# Patient Record
Sex: Female | Born: 1980 | Race: Black or African American | Hispanic: No | Marital: Married | Smoking: Former smoker
Health system: Southern US, Community
[De-identification: ages and names within clinical notes are randomized; demographics above are authoritative.]

## PROBLEM LIST (undated history)

## (undated) DIAGNOSIS — I10 Essential (primary) hypertension: Secondary | ICD-10-CM

## (undated) DIAGNOSIS — D649 Anemia, unspecified: Secondary | ICD-10-CM

## (undated) DIAGNOSIS — G4730 Sleep apnea, unspecified: Secondary | ICD-10-CM

## (undated) DIAGNOSIS — I509 Heart failure, unspecified: Secondary | ICD-10-CM

## (undated) DIAGNOSIS — E785 Hyperlipidemia, unspecified: Secondary | ICD-10-CM

## (undated) DIAGNOSIS — N390 Urinary tract infection, site not specified: Secondary | ICD-10-CM

## (undated) HISTORY — PX: UPPER GASTROINTESTINAL ENDOSCOPY: SHX188

---

## 2006-08-23 ENCOUNTER — Emergency Department (HOSPITAL_COMMUNITY): Admission: EM | Admit: 2006-08-23 | Discharge: 2006-08-24 | Payer: Self-pay | Admitting: Emergency Medicine

## 2006-10-12 ENCOUNTER — Emergency Department (HOSPITAL_COMMUNITY): Admission: EM | Admit: 2006-10-12 | Discharge: 2006-10-12 | Payer: Self-pay | Admitting: Family Medicine

## 2006-11-07 ENCOUNTER — Ambulatory Visit (HOSPITAL_COMMUNITY): Admission: RE | Admit: 2006-11-07 | Discharge: 2006-11-07 | Payer: Self-pay | Admitting: Urology

## 2007-01-02 ENCOUNTER — Emergency Department (HOSPITAL_COMMUNITY): Admission: EM | Admit: 2007-01-02 | Discharge: 2007-01-02 | Payer: Self-pay | Admitting: Family Medicine

## 2007-02-17 ENCOUNTER — Emergency Department (HOSPITAL_COMMUNITY): Admission: EM | Admit: 2007-02-17 | Discharge: 2007-02-17 | Payer: Self-pay | Admitting: Emergency Medicine

## 2007-02-20 ENCOUNTER — Ambulatory Visit (HOSPITAL_BASED_OUTPATIENT_CLINIC_OR_DEPARTMENT_OTHER): Admission: RE | Admit: 2007-02-20 | Discharge: 2007-02-20 | Payer: Self-pay | Admitting: Family Medicine

## 2007-02-22 ENCOUNTER — Ambulatory Visit: Payer: Self-pay | Admitting: Internal Medicine

## 2007-04-01 ENCOUNTER — Emergency Department (HOSPITAL_COMMUNITY): Admission: EM | Admit: 2007-04-01 | Discharge: 2007-04-01 | Payer: Self-pay | Admitting: Emergency Medicine

## 2007-04-22 ENCOUNTER — Emergency Department (HOSPITAL_COMMUNITY): Admission: EM | Admit: 2007-04-22 | Discharge: 2007-04-22 | Payer: Self-pay | Admitting: Family Medicine

## 2007-06-28 ENCOUNTER — Emergency Department (HOSPITAL_COMMUNITY): Admission: EM | Admit: 2007-06-28 | Discharge: 2007-06-28 | Payer: Self-pay | Admitting: Emergency Medicine

## 2007-10-07 ENCOUNTER — Emergency Department (HOSPITAL_COMMUNITY): Admission: EM | Admit: 2007-10-07 | Discharge: 2007-10-07 | Payer: Self-pay | Admitting: Family Medicine

## 2008-10-15 ENCOUNTER — Emergency Department (HOSPITAL_COMMUNITY): Admission: EM | Admit: 2008-10-15 | Discharge: 2008-10-15 | Payer: Self-pay | Admitting: *Deleted

## 2008-11-02 ENCOUNTER — Ambulatory Visit: Payer: Self-pay | Admitting: Cardiology

## 2008-11-02 ENCOUNTER — Inpatient Hospital Stay (HOSPITAL_COMMUNITY): Admission: EM | Admit: 2008-11-02 | Discharge: 2008-11-04 | Payer: Self-pay | Admitting: Family Medicine

## 2008-11-02 ENCOUNTER — Encounter (INDEPENDENT_AMBULATORY_CARE_PROVIDER_SITE_OTHER): Payer: Self-pay | Admitting: Cardiology

## 2008-11-12 ENCOUNTER — Emergency Department (HOSPITAL_COMMUNITY): Admission: EM | Admit: 2008-11-12 | Discharge: 2008-11-12 | Payer: Self-pay | Admitting: Emergency Medicine

## 2008-11-15 ENCOUNTER — Emergency Department (HOSPITAL_COMMUNITY): Admission: EM | Admit: 2008-11-15 | Discharge: 2008-11-15 | Payer: Self-pay | Admitting: Emergency Medicine

## 2008-11-20 DIAGNOSIS — R609 Edema, unspecified: Secondary | ICD-10-CM | POA: Insufficient documentation

## 2008-11-20 DIAGNOSIS — E119 Type 2 diabetes mellitus without complications: Secondary | ICD-10-CM | POA: Insufficient documentation

## 2008-11-20 DIAGNOSIS — I10 Essential (primary) hypertension: Secondary | ICD-10-CM | POA: Insufficient documentation

## 2008-11-20 DIAGNOSIS — I517 Cardiomegaly: Secondary | ICD-10-CM | POA: Insufficient documentation

## 2008-11-20 DIAGNOSIS — G4730 Sleep apnea, unspecified: Secondary | ICD-10-CM | POA: Insufficient documentation

## 2008-11-22 ENCOUNTER — Encounter (INDEPENDENT_AMBULATORY_CARE_PROVIDER_SITE_OTHER): Payer: Self-pay | Admitting: Nurse Practitioner

## 2008-11-22 ENCOUNTER — Ambulatory Visit: Payer: Self-pay | Admitting: Internal Medicine

## 2008-11-22 DIAGNOSIS — I2789 Other specified pulmonary heart diseases: Secondary | ICD-10-CM | POA: Insufficient documentation

## 2008-11-22 DIAGNOSIS — I5023 Acute on chronic systolic (congestive) heart failure: Secondary | ICD-10-CM | POA: Insufficient documentation

## 2008-11-22 DIAGNOSIS — A389 Scarlet fever, uncomplicated: Secondary | ICD-10-CM | POA: Insufficient documentation

## 2008-11-22 DIAGNOSIS — R000 Tachycardia, unspecified: Secondary | ICD-10-CM | POA: Insufficient documentation

## 2008-11-22 LAB — CONVERTED CEMR LAB
BUN: 13 mg/dL (ref 6–23)
CO2: 29 meq/L (ref 19–32)
Chloride: 107 meq/L (ref 96–112)
Creatinine, Ser: 0.9 mg/dL (ref 0.4–1.2)
GFR calc non Af Amer: 96.03 mL/min (ref >60)
Glucose, Bld: 223 mg/dL — ABNORMAL HIGH (ref 70–99)
Potassium: 4.6 meq/L (ref 3.5–5.1)
Sodium: 141 meq/L (ref 135–145)

## 2009-05-29 ENCOUNTER — Emergency Department (HOSPITAL_COMMUNITY): Admission: EM | Admit: 2009-05-29 | Discharge: 2009-05-29 | Payer: Self-pay | Admitting: Emergency Medicine

## 2010-04-02 ENCOUNTER — Emergency Department (HOSPITAL_COMMUNITY): Admission: EM | Admit: 2010-04-02 | Discharge: 2010-04-02 | Payer: Self-pay | Admitting: Emergency Medicine

## 2010-04-25 ENCOUNTER — Emergency Department (HOSPITAL_COMMUNITY): Admission: EM | Admit: 2010-04-25 | Discharge: 2010-04-25 | Payer: Self-pay | Admitting: Family Medicine

## 2010-09-28 LAB — WOUND CULTURE: Gram Stain: NONE SEEN

## 2010-10-18 LAB — BASIC METABOLIC PANEL
Chloride: 101 mEq/L (ref 96–112)
Creatinine, Ser: 0.82 mg/dL (ref 0.4–1.2)
GFR calc non Af Amer: >60 mL/min (ref >60)
Potassium: 3.9 mEq/L (ref 3.5–5.1)
Sodium: 135 mEq/L (ref 135–145)

## 2010-10-18 LAB — DIFFERENTIAL
Basophils Absolute: 0 10*3/uL (ref 0.0–0.1)
Eosinophils Absolute: 0.2 10*3/uL (ref 0.0–0.7)
Monocytes Relative: 4 % (ref 3–12)

## 2010-10-18 LAB — CBC
Hemoglobin: 13 g/dL (ref 12.0–15.0)
MCHC: 32.7 g/dL (ref 30.0–36.0)
Platelets: 351 10*3/uL (ref 150–400)
RBC: 4.44 MIL/uL (ref 3.87–5.11)
RDW: 13.4 % (ref 11.5–15.5)

## 2010-10-24 LAB — POCT PREGNANCY, URINE: Preg Test, Ur: NEGATIVE

## 2010-10-24 LAB — POCT URINALYSIS DIP (DEVICE)
Glucose, UA: 100 mg/dL — AB
Protein, ur: >=300 mg/dL — AB
Urobilinogen, UA: 2 mg/dL — ABNORMAL HIGH (ref 0.0–1.0)

## 2010-10-24 LAB — URINE CULTURE

## 2010-10-25 LAB — GLUCOSE, CAPILLARY
Glucose-Capillary: 173 mg/dL — ABNORMAL HIGH (ref 70–99)
Glucose-Capillary: 175 mg/dL — ABNORMAL HIGH (ref 70–99)
Glucose-Capillary: 184 mg/dL — ABNORMAL HIGH (ref 70–99)
Glucose-Capillary: 190 mg/dL — ABNORMAL HIGH (ref 70–99)
Glucose-Capillary: 192 mg/dL — ABNORMAL HIGH (ref 70–99)
Glucose-Capillary: 192 mg/dL — ABNORMAL HIGH (ref 70–99)
Glucose-Capillary: 207 mg/dL — ABNORMAL HIGH (ref 70–99)
Glucose-Capillary: 214 mg/dL — ABNORMAL HIGH (ref 70–99)
Glucose-Capillary: 239 mg/dL — ABNORMAL HIGH (ref 70–99)
Glucose-Capillary: 37 mg/dL — CL (ref 70–99)

## 2010-10-25 LAB — COMPREHENSIVE METABOLIC PANEL
ALT: 33 U/L (ref 0–35)
ALT: 35 U/L (ref 0–35)
AST: 32 U/L (ref 0–37)
AST: 48 U/L — ABNORMAL HIGH (ref 0–37)
Albumin: 3.2 g/dL — ABNORMAL LOW (ref 3.5–5.2)
Albumin: 3.4 g/dL — ABNORMAL LOW (ref 3.5–5.2)
Alkaline Phosphatase: 97 U/L (ref 39–117)
CO2: 24 mEq/L (ref 19–32)
Calcium: 8.9 mg/dL (ref 8.4–10.5)
Chloride: 101 mEq/L (ref 96–112)
Chloride: 101 mEq/L (ref 96–112)
Creatinine, Ser: 0.94 mg/dL (ref 0.4–1.2)
GFR calc Af Amer: >60 mL/min (ref >60)
GFR calc Af Amer: >60 mL/min (ref >60)
GFR calc non Af Amer: 50 mL/min — ABNORMAL LOW (ref >60)
Potassium: 4.2 mEq/L (ref 3.5–5.1)
Sodium: 137 mEq/L (ref 135–145)
Sodium: 135 mEq/L (ref 135–145)
Total Bilirubin: 0.8 mg/dL (ref 0.3–1.2)

## 2010-10-25 LAB — DIFFERENTIAL
Basophils Absolute: 0 10*3/uL (ref 0.0–0.1)
Basophils Absolute: 0 10*3/uL (ref 0.0–0.1)
Basophils Relative: 0 % (ref 0–1)
Eosinophils Absolute: 0.1 10*3/uL (ref 0.0–0.7)
Eosinophils Absolute: 0.1 10*3/uL (ref 0.0–0.7)
Eosinophils Relative: 1 % (ref 0–5)
Eosinophils Relative: 1 % (ref 0–5)
Lymphocytes Relative: 19 % (ref 12–46)
Lymphocytes Relative: 12 % (ref 12–46)
Monocytes Absolute: 0.4 10*3/uL (ref 0.1–1.0)
Monocytes Relative: 4 % (ref 3–12)

## 2010-10-25 LAB — CARDIAC PANEL(CRET KIN+CKTOT+MB+TROPI)
CK, MB: 1 ng/mL (ref 0.3–4.0)
Troponin I: 0.01 ng/mL (ref 0.00–0.06)

## 2010-10-25 LAB — POCT CARDIAC MARKERS
CKMB, poc: <1 ng/mL — ABNORMAL LOW (ref 1.0–8.0)
Myoglobin, poc: 83.4 ng/mL (ref 12–200)
Troponin i, poc: <0.05 ng/mL (ref 0.00–0.09)

## 2010-10-25 LAB — URINALYSIS, ROUTINE W REFLEX MICROSCOPIC
Bilirubin Urine: NEGATIVE
Glucose, UA: NEGATIVE mg/dL
Hgb urine dipstick: NEGATIVE
Ketones, ur: NEGATIVE mg/dL
Leukocytes, UA: NEGATIVE
Nitrite: NEGATIVE
Nitrite: NEGATIVE
Protein, ur: 30 mg/dL — AB
Protein, ur: 30 mg/dL — AB
Specific Gravity, Urine: 1.014 (ref 1.005–1.030)
Urobilinogen, UA: 0.2 mg/dL (ref 0.0–1.0)
Urobilinogen, UA: 0.2 mg/dL (ref 0.0–1.0)
pH: 6.5 (ref 5.0–8.0)

## 2010-10-25 LAB — URINE CULTURE: Colony Count: 9000

## 2010-10-25 LAB — POCT I-STAT, CHEM 8
BUN: 10 mg/dL (ref 6–23)
Chloride: 101 mEq/L (ref 96–112)
Glucose, Bld: 181 mg/dL — ABNORMAL HIGH (ref 70–99)
Hemoglobin: 14.6 g/dL (ref 12.0–15.0)
TCO2: 27 mmol/L (ref 0–100)

## 2010-10-25 LAB — POCT URINALYSIS DIP (DEVICE)
Ketones, ur: 15 mg/dL — AB
Protein, ur: >=300 mg/dL — AB
Specific Gravity, Urine: 1.01 (ref 1.005–1.030)
pH: 5 (ref 5.0–8.0)

## 2010-10-25 LAB — CBC
HCT: 39.9 % (ref 36.0–46.0)
Hemoglobin: 13.1 g/dL (ref 12.0–15.0)
MCHC: 32.9 g/dL (ref 30.0–36.0)
MCV: 87.6 fL (ref 78.0–100.0)
MCV: 87.9 fL (ref 78.0–100.0)
RBC: 4.68 MIL/uL (ref 3.87–5.11)
RBC: 4.54 MIL/uL (ref 3.87–5.11)
RDW: 14.4 % (ref 11.5–15.5)
WBC: 10.6 10*3/uL — ABNORMAL HIGH (ref 4.0–10.5)
WBC: 13 10*3/uL — ABNORMAL HIGH (ref 4.0–10.5)

## 2010-10-25 LAB — POCT PREGNANCY, URINE
Preg Test, Ur: NEGATIVE
Preg Test, Ur: NEGATIVE

## 2010-10-25 LAB — BRAIN NATRIURETIC PEPTIDE: Pro B Natriuretic peptide (BNP): 308 pg/mL — ABNORMAL HIGH (ref 0.0–100.0)

## 2010-10-25 LAB — HEMOGLOBIN A1C
Hgb A1c MFr Bld: 9.6 % — ABNORMAL HIGH (ref 4.6–6.1)
Mean Plasma Glucose: 229 mg/dL

## 2010-10-25 LAB — PREGNANCY, URINE: Preg Test, Ur: NEGATIVE

## 2010-10-25 LAB — URINE MICROSCOPIC-ADD ON

## 2010-10-25 LAB — CK TOTAL AND CKMB (NOT AT ARMC): CK, MB: 1.1 ng/mL (ref 0.3–4.0)

## 2010-10-25 LAB — TROPONIN I: Troponin I: 0.01 ng/mL (ref 0.00–0.06)

## 2010-11-28 NOTE — Discharge Summary (Signed)
Kathryn [name], Kathryn [name] NO.:  [id number]   MEDICAL RECORD NO.:  [id number]          PATIENT TYPE:  INP   LOCATION:  4715                         FACILITY:  [facility]   PHYSICIAN:  Charlestine Massed, MDDATE OF BIRTH:  Apr 27, 1981   DATE OF ADMISSION:  11/01/2008  DATE OF DISCHARGE:  11/04/2008                               DISCHARGE SUMMARY   PRIMARY CARE PHYSICIAN:  Thayer Headings, MD, of [facility].   CARDIOLOGY:  Luis Abed, MD, [facility], of [facility].   PULMONOLOGY:  Clinton D. Maple Hudson, MD, FCCP, FACP   REASON FOR ADMISSION:  Shortness of breath.   DISCHARGE DIAGNOSIS:  1. Congestive heart failure with systolic dysfunction of both left and      right ventricles with an ejection fraction of 25-30%.  2. Obstructive sleep apnea with right heart dysfunction.  3. Pulmonary hypertension.  4. Morbid obesity.  5. Diabetes mellitus.  6. Hypertension.   DISCHARGE MEDICATIONS:  1. Coreg 6.25 mg p.o. b.i.d..  2. Prinzide 10/12.5 two tablets p.o. daily.  3. Metformin 850 mg p.o. b.i.d.  4. Potassium chloride 10 mEq p.o. daily.  5. NovoLog coverage q.a.c. and nightly with moderate scale of the      [facility] scale on sliding scale.  6. Lantus 8 units nightly subcutaneously.   HOSPITAL COURSE:  1. Congestive heart failure.  Ms. Kathryn [name] is a 30 year old female,      who is morbidly obese and was diagnosed with obstructive sleep      apnea in August 2008, who is not on a CPAP at home as she is not      using it.  She is not using CPAP at home, came in with complaints      of shortness of breath.  She stated that the shortness of breath,      has come on over a few days and is gradually getting worse.  She      works as a Occupational psychologist for [employer] and      she does not do any strenuous works.  As of the exercise tolerance,      she stated that she can walk quite a long distances without getting  short of breath that she can make even to more than 10 blocks and      for going up the stairs, she said that she can go up 1 full flight      of stairs without having to stop and neither chest pain nor      shortness of breath, stops her ability to work or walk.  She was      evaluated for heart failure as she had crackles initially      minimally, and was put on diuresis.  Then, echocardiogram revealed      an ejection fraction of 25% and diffuse left ventricular      hypokinesis.  Also, the right ventricle was also dilated with      systolic function depressed and pulmonary artery pressure was 48      mmHg.  Cardiology consult was called and seen by Dr. Myrtis Ser and was      advised to follow up in the CHF Clinic at [facility], so the patient      is being discharged today to follow up with Cardiology as      outpatient for further care.  2. Obstructive sleep apnea.  The patient stated that she did not      receive CPAP machine because of insurance issues.  We discussed      with the social worker and Sports coach, worked together here, and      her CPAP machine has been set up, and she will be getting it today      evening as she has been advised to use it at a pressure of 11 cm of      water as has been advised by Dr. Maple Hudson in the report of his      nocturnal polysomnogram.  3. Diabetes mellitus.  Her blood sugars still not well controlled, so      she has been started on Lantus at 80 units nightly and started on      metformin 850 mg p.o. b.i.d.  She will also continue Glucometer      check q.a.c. and q.h.s. that is before meals and at night to get      coverage and to cover with NovoLog insulin.  4. Hypertension.  She has been started on Prinzide 10/12.5 one to two      tablets p.o. daily.  Blood pressure is very well controlled.  She      is also on Coreg for hypertension as well as for the heart failure,      so the patient's blood pressure is currently well controlled.   DISPOSITION:   She is discharged back home.   INSTRUCTIONS:  1. To keep fluid intake to 1.5 L a day.  2. Low-salt, heart-healthy, and diabetic diet.  3. Follow up blood sugars regularly and get it controlled with      coverage.  4. Follow up with Cardiology and Pulmonology as outpatient.  5. To use CPAP at home regularly at night.   FOLLOWUP:  1. Follow up with [facility], date has been set on Nov 22, 2008, at 10:20 a.m. and in the CHF Clinic, until then continue the      medications and instructions as mentioned.  2. The patient is to follow up with the primary care doctor within 1-2      weeks.  3. Return to work on Monday, that is November 08, 2008.   A total of 45 minutes was spent on this discharge.      Charlestine Massed, MD  Electronically Signed     UT/MEDQ  D:  11/04/2008  T:  11/05/2008  Job:  [id number]   cc:   Thayer Headings, M.D.  Luis Abed, MD, [facility]  Clinton D. Maple Hudson, MD, FCCP, FACP

## 2010-11-28 NOTE — Procedures (Signed)
Kathryn [name], Kathryn [name] NO.:  [id number]   MEDICAL RECORD NO.:  [id number]          PATIENT TYPE:  OUT   LOCATION:  SLEEP CENTER                 FACILITY:  [facility]   PHYSICIAN:  Clinton D. Maple Hudson, MD, FCCP, FACPDATE OF BIRTH:  09[birth year]18/1980   DATE OF STUDY:  02/20/2007                            NOCTURNAL POLYSOMNOGRAM   REFERRING PHYSICIAN:  Knox Royalty, Dr.   INDICATION FOR STUDY:  Hypersomnia with sleep apnea.   EPWORTH SLEEPINESS SCORE:  11/24, height 5 feet 6 inches, weight 460  pounds.   HOME MEDICATIONS:  Listed.   SLEEP ARCHITECTURE:  Total sleep time 299 minutes with sleep efficiency  73%.  Stage I 12%, stage II 69%, stage III 10%, REM 9% of total sleep  time.  Sleep latency 10 minutes.  REM latency 271 minutes.  Awake after  sleep onset 94 minutes.  Arousal index 19.3.  No bedtime medication was  taken.   RESPIRATORY DATA:  Split study protocol.  Apnea/hypopnea index (AHI/RDI)  38.2 obstructive events per hour indicating moderately severe  obstructive sleep apnea before CPAP.  There were a total of 80 hypopneas  before CPAP.  Events were not positional.  REM AHI 66.4.  CPAP was  titrated to 16-CWP.  Pressures in this range were associated with  breakthrough events and may have represented development of nasal  congestion.  Suggest initial home trial at 11-CWP which was associated  with an AHI of zero per hour.  Further adjustment may be necessary but  this would be a comfortable pressure to try initially.  A medium comfort  full face gel mask was chosen with heated humidifier.   OXYGEN DATA:  Moderate continuous snoring with oxygen desaturation to a  nadir of 80% before CPAP.  After CPAP control, saturation held 93% on  room air.   CARDIAC DATA:  Sinus tachycardia 104-107 per minute.   MOVEMENT-PARASOMNIA:  Insignificant.  Bathroom x2.   IMPRESSIONS-RECOMMENDATIONS:  1. Moderately severe obstructive sleep apnea/hypopnea syndrome, AHI      38.2 per  hour with non-positional events, moderate snoring, and      oxygen desaturation to a nadir of 80%.  2. CPAP titration to suggest an initial titration pressure of 11-CWP      which gave AHI of zero per hour during the time attempted.  Higher      pressures were associated with residual events which may have been      due to      development of nasal congestion with higher air flows.  A medium      Respironics Comfort full face gel mask was used with heated      humidifier.      Clinton D. Maple Hudson, MD, [facility], FACP  Diplomate, Biomedical engineer of Sleep Medicine  Electronically Signed     CDY/MEDQ  D:  02/22/2007 13:47:59  T:  02/23/2007 12:21:10  Job:  [id number]

## 2010-11-28 NOTE — Consult Note (Signed)
NAMEAMOR, Kathryn NO.:  [id number]   MEDICAL RECORD NO.:  [id number]          PATIENT TYPE:  INP   LOCATION:  4715                         FACILITY:  [facility]   PHYSICIAN:  Luis Abed, MD, FACCDATE OF BIRTH:  06/11/1981   DATE OF CONSULTATION:  11/04/2008  DATE OF DISCHARGE:                                 CONSULTATION   CARDIOLOGIST:  New, will be Luis Abed, MD, [facility]   The patient will also be followed in the Heart Failure Clinic.   PRIMARY CARE PHYSICIAN:  Thayer Headings, MD, with [facility].   Kathryn [name] is a 30 year old African American female with past medical  history of hypertension, diabetes, and morbid obesity also with a  history of sleep apnea without previous initiation of CPAP.  She has no  known history of coronary artery disease.  She states she was told she  had an enlarged heart years ago by chest x-ray, but has never been  diagnosed formally with cardiomyopathy or CHF.  She presented to urgent  care on November 02, 2008, complaining of mild dyspnea associated with cold  and congestion with a productive cough.  The patient apparently was  found to be hypertensive and was sent by [facility] to [facility] ER for  further evaluation.  Previous documentation shows a blood pressure of  168/121.  Kathryn [name] states she was very anxious by that point, extremely  upset because she did not know what is happening.  She was treated with  clonidine and Lasix.  Chest x-ray obtained showed cardiac enlargement  and vascular congestion without overt pulmonary edema.  The patient was  admitted by the [facility] team for further evaluation, a 2-D  echocardiogram was obtained that showed an EF of 25-30% with diffuse  hypokinesis and moderate pulmonary hypertension with peak pressure of 48  mmHg.  Kathryn [name] states she is feeling much better.  She states that  every one has made a major ordeal over her symptoms.  She does not feel  that she  was in any acute distress.  She is somewhat frustrated that she  has been hospitalized for the above symptoms.  In reviewing her history  she has a very sedentary lifestyle.  She lives alone, morbidly obese,  and noncompliant with diet.  She states that she generally has lower  extremity edema that is no worse than it is at baseline.  She does not  feel her breathing has gotten worse.  She is dyspneic with exertion  contributes that to extreme deconditioning.  She does not know what her  blood pressure normally runs, but states she has well controlled  hypertension.   PAST MEDICAL HISTORY:  1. Diabetes.  2. Hypertension.  3. Chronic lower extremity edema.  4. Cardiomegaly apparently by chest x-ray years ago.  5. Morbid obesity.  6. Obstructive sleep apnea without CPAP use.   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  She denies any tobacco or EtOH use.  Sedentary lifestyle.  She  lives alone.  No children.  Mother  is alive with diabetes, hypertension,  and sleep apnea.  Father is unknown.   ALLERGIES:  SULFA.   MEDICATIONS HERE:  1. Carvedilol 6.25 mg b.i.d., which is new.  2. Prinzide 10/12.5 mg.  3. Metformin 850 b.i.d.  4. Lantus insulin.  5. Lovenox.  6. Potassium chloride.   REVIEW OF SYSTEMS:  Positive for dyspnea.  Positive for productive  cough.  Positive for chronic lower extremity edema.  All other systems  reviewed and negative.   PHYSICAL EXAMINATION:  VITAL SIGNS:  Temperature 97, heart rate 90,  respirations 19, blood pressure currently 109/72, and sat 93% on room  air.  Weight 206.8 kg.  GENERAL:  Kathryn [name] is in no acute distress.  HEENT:  Unremarkable.  NECK:  Supple without obvious JVD, difficult to assess secondary to body  habitus.  LUNGS:  Clear to auscultation with distant breath sounds throughout.  Poor inspiratory effort.  CARDIOVASCULAR:  S1-S2.  Regular rate and rhythm.  Distant heart sounds.  ABDOMEN:  Obese,  soft, and nontender.  Positive bowel sounds.  Unable to  appreciate hepatomegaly.  EXTREMITIES:  Lower extremities without clubbing, cyanosis, or signs of  a pitting edema.  NEUROLOGIC:  Alert and oriented x3 with flat effect.   Initial chest x-ray results as stated above.  Lab work shows a couple of  days ago sodium 137, potassium 3.5, BUN 8, creatinine 0.9, glucose 189,  H and H 13.4 and 41.4, WBC is 10.6, and platelets 337,000.  Magnesium  1.7, hemoglobin A1c 9.6, AST is 32, and ALT is 33.  Cardiac enzymes are  negative x3.  Initial BNP was 308.  A 12-lead EKG shows sinus tach at a  rate of 115.  Normal sinus rhythm with mild LVH.   IMPRESSION:  Left ventricular dysfunction.  We will need to optimize  medication.  The patient is in no acute distress.  At this time, agree  with switching metoprolol to carvedilol.  Continue ACE inhibitor.  The  patient will most likely need a diuretic stronger than  hydrochlorothiazide.  She also needs to be set up with CPAP for her  severe sleep apnea, which I suspect is contributing to her LV  dysfunction, diabetes, and hypertension per primary care physician.  The  patient is stable to be discharged home from a cardiac perspective and  followup outpatient.  I will see her in the Heart Failure Clinic on Nov 22, 2008, at 10:20 as a new patient.  Dr. Willa Rough has been into  examine and assess the patient, agrees with plan of care.      Dorian Pod, ACNP      Luis Abed, MD, [facility]  Electronically Signed    MB/MEDQ  D:  11/04/2008  T:  11/04/2008  Job:  [id number]

## 2010-11-28 NOTE — H&P (Signed)
Kathryn [name], Kathryn [name] NO.:  [id number]   MEDICAL RECORD NO.:  [id number]          PATIENT TYPE:  INP   LOCATION:  4715                         FACILITY:  [facility]   PHYSICIAN:  Selena Batten, MD     DATE OF BIRTH:  09/28/1980   DATE OF ADMISSION:  11/01/2008  DATE OF DISCHARGE:                              HISTORY & PHYSICAL   CHIEF COMPLAINT:  Shortness of breath.   HISTORY OF PRESENT ILLNESS:  This is a 30 year old African American  female with hypertension, diabetes mellitus, and morbid obesity who  presents for further evaluation and treatment for shortness of breath.  The patient states that the shortness of breath has been getting worse  over the past several days, and it seems to get worse with exertion.  She denies any overt episodes of chest pain or pressure.  She is unable  to lie down on her back as usual.  She has no increased lower extremity  edema compared to baseline.  She has difficult time exerting herself and  has limited mobility given her size.  No early family history of  premature coronary artery disease.   PAST MEDICAL HISTORY:  1. Morbid obesity.  2. Diabetes mellitus.  3. Hypertension.   ALLERGIES:  The patient is unable to recall allergies at this time.   MEDICATIONS:  1. Lisinopril, dosage unknown.  2. Metformin, dosage unknown.  3. Metoprolol tartrate, dosage unknown.  4. Nifedipine, dosage unknown.  5. Macrobid, dosage unknown.   SOCIAL HISTORY:  No tobacco, no alcohol.   REVIEW OF SYSTEMS:  A 14-point review of systems was performed and was  negative except as per HPI.   PHYSICAL EXAMINATION:  VITAL SIGNS:  Blood pressure was 161/110, pulse  is 110, and sating 97% on room air.  GENERAL:  In no acute distress.  HEAD:  Normocephalic and atraumatic.  EYES:  Pupils equal, round, and reactive to light.  Extraocular muscles  are intact.  NECK:  Supple.  No masses.  No bruits.  No thyromegaly.  LUNGS:  Clear to auscultation  bilaterally.  HEART:  Slightly tachycardic, buy regular.  No murmurs, rubs, or  gallops.  ABDOMEN:  Positive bowel sounds, soft, nontender, and nondistended.  EXTREMITIES:  No clubbing, cyanosis, or edema.  NEUROLOGIC:  Nonfocal, moving all extremities x4.   LABORATORY DATA:  Lab work demonstrates UA that is negative for  infection.  Negative pregnancy test.  BNP of 308 and cardiac markers  which are negative.  CBC which shows a white count of 10.6 which is  apparently elevated.   ASSESSMENT:  This is a 30 year old African American female with diabetes  mellitus, hypertension, and morbid obesity here with shortness of  breath.  Shortness of breath.  We will assess with a transthoracic echo in the  a.m.  In the meantime, we will rule out with cardiac enzymes.  Monitor  with telemetry.  The patient already received IV Lasix, will monitor  response to that.  We will get the patient back on her lisinopril as  well as metoprolol.  Consider further workup pending  results of the  transthoracic echo.   DISPOSITION:  Pending further workup.      Selena Batten, MD  Electronically Signed     BS/MEDQ  D:  11/02/2008  T:  11/02/2008  Job:  [id number]

## 2011-01-15 HISTORY — PX: GASTRIC BYPASS: SHX52

## 2011-04-09 LAB — POCT URINALYSIS DIP (DEVICE)
Bilirubin Urine: NEGATIVE
Nitrite: POSITIVE — AB
Protein, ur: 30 — AB
pH: 6.5

## 2011-04-23 LAB — POCT URINALYSIS DIP (DEVICE)
Ketones, ur: NEGATIVE
Operator id: 116391
Protein, ur: NEGATIVE
Specific Gravity, Urine: 1.015

## 2011-04-23 LAB — URINALYSIS, MICROSCOPIC ONLY
Bilirubin Urine: NEGATIVE
Glucose, UA: NEGATIVE
Ketones, ur: NEGATIVE
pH: 6.5

## 2011-04-23 LAB — URINE CULTURE: Colony Count: >=100000

## 2011-04-23 LAB — POCT PREGNANCY, URINE
Operator id: 116391
Preg Test, Ur: NEGATIVE

## 2011-04-26 LAB — POCT URINALYSIS DIP (DEVICE)
Nitrite: POSITIVE — AB
Operator id: 247071
Protein, ur: 30 — AB
Urobilinogen, UA: 0.2

## 2011-04-26 LAB — POCT PREGNANCY, URINE
Operator id: 247071
Preg Test, Ur: NEGATIVE

## 2011-04-26 LAB — URINE CULTURE

## 2011-06-27 ENCOUNTER — Encounter (HOSPITAL_COMMUNITY): Payer: Self-pay

## 2011-07-05 ENCOUNTER — Other Ambulatory Visit: Payer: Self-pay | Admitting: Obstetrics and Gynecology

## 2011-07-06 NOTE — Patient Instructions (Addendum)
   Your procedure is scheduled on: Friday, Dec 28th  Enter through the Main Entrance of [facility] at: 8:45am Pick up the phone at the desk and dial [phone] and inform us of your arrival.  Please call this number if you have any problems the morning of surgery: [phone]  Remember: Do not eat food after midnight: Thursday Do not drink clear liquids after:  Thursday Take these medicines the morning of surgery with a SIP OF WATER: Coreg and lisinopril/hctz  Do not wear jewelry, make-up, or FINGER nail polish Do not wear lotions, powders, perfumes or deodorant. Do not shave 48 hours prior to surgery. Do not bring valuables to the hospital.  Patients discharged on the day of surgery will not be allowed to drive home.   Home with Benjie Karvonen  cell [phone].  Remember to use your hibiclens as instructed.Please shower with 1/2 bottle the evening before your surgery and the other 1/2 bottle the morning of surgery.

## 2011-07-09 ENCOUNTER — Other Ambulatory Visit (HOSPITAL_COMMUNITY): Payer: Self-pay | Admitting: Internal Medicine

## 2011-07-09 ENCOUNTER — Encounter (HOSPITAL_COMMUNITY): Payer: Self-pay

## 2011-07-09 ENCOUNTER — Encounter (HOSPITAL_COMMUNITY)
Admission: RE | Admit: 2011-07-09 | Discharge: 2011-07-09 | Disposition: A | Discharge status: Home / Self Care | Payer: BC Managed Care – PPO | Source: Ambulatory Visit | Attending: Obstetrics and Gynecology | Admitting: Obstetrics and Gynecology

## 2011-07-09 DIAGNOSIS — I509 Heart failure, unspecified: Secondary | ICD-10-CM

## 2011-07-09 HISTORY — DX: Essential (primary) hypertension: I10

## 2011-07-09 HISTORY — DX: Sleep apnea, unspecified: G47.30

## 2011-07-09 HISTORY — DX: Heart failure, unspecified: I50.9

## 2011-07-09 HISTORY — DX: Anemia, unspecified: D64.9

## 2011-07-09 HISTORY — DX: Hyperlipidemia, unspecified: E78.5

## 2011-07-09 LAB — BASIC METABOLIC PANEL
BUN: 11 mg/dL (ref 6–23)
Chloride: 101 mEq/L (ref 96–112)
GFR calc Af Amer: >90 mL/min (ref >90)
Glucose, Bld: 94 mg/dL (ref 70–99)
Potassium: 3.6 mEq/L (ref 3.5–5.1)

## 2011-07-09 LAB — CBC
HCT: 38.4 % (ref 36.0–46.0)
Hemoglobin: 12.6 g/dL (ref 12.0–15.0)
MCHC: 32.8 g/dL (ref 30.0–36.0)

## 2011-07-09 NOTE — Pre-Procedure Instructions (Addendum)
Reviewed patient's history with Dr Arby Barrette.  Patient has echo cardiogram scheduled on Wed, Dec 26th at 815am.  Will have PAT RN to request a copy of this report for anesthesia.  EKG done on Fri., Dec 14th at [facility]/Dr Thayer Headings [phone].  Will have PAT RN to request copy of EKG on Wednesday, Dec 26th.  Dr Arby Barrette reviewed old copy of EKG from 11/22/08.  Patient instructed to take coreg and lisinopril/hctz with small sip of water Day of Surgery.

## 2011-07-11 ENCOUNTER — Ambulatory Visit (HOSPITAL_COMMUNITY): Payer: BC Managed Care – PPO | Attending: Cardiovascular Disease | Admitting: Radiology

## 2011-07-11 DIAGNOSIS — I509 Heart failure, unspecified: Secondary | ICD-10-CM

## 2011-07-11 DIAGNOSIS — I10 Essential (primary) hypertension: Secondary | ICD-10-CM | POA: Insufficient documentation

## 2011-07-11 DIAGNOSIS — E119 Type 2 diabetes mellitus without complications: Secondary | ICD-10-CM | POA: Insufficient documentation

## 2011-07-11 DIAGNOSIS — I059 Rheumatic mitral valve disease, unspecified: Secondary | ICD-10-CM | POA: Insufficient documentation

## 2011-07-12 ENCOUNTER — Encounter (HOSPITAL_COMMUNITY): Payer: Self-pay | Admitting: Internal Medicine

## 2011-07-12 ENCOUNTER — Telehealth: Payer: Self-pay

## 2011-07-12 NOTE — Telephone Encounter (Signed)
Echo faxed to [facility] @ [phone] 07/12/11/km

## 2011-07-13 ENCOUNTER — Ambulatory Visit (HOSPITAL_COMMUNITY)
Admission: RE | Admit: 2011-07-13 | Discharge: 2011-07-13 | Disposition: A | Discharge status: Home / Self Care | Payer: BC Managed Care – PPO | Source: Ambulatory Visit | Attending: Obstetrics and Gynecology | Admitting: Obstetrics and Gynecology

## 2011-07-13 ENCOUNTER — Ambulatory Visit (HOSPITAL_COMMUNITY): Payer: BC Managed Care – PPO | Admitting: Anesthesiology

## 2011-07-13 ENCOUNTER — Encounter (HOSPITAL_COMMUNITY)
Admission: RE | Disposition: A | Discharge status: Home / Self Care | Payer: Self-pay | Source: Ambulatory Visit | Attending: Obstetrics and Gynecology

## 2011-07-13 ENCOUNTER — Other Ambulatory Visit: Payer: Self-pay | Admitting: Obstetrics and Gynecology

## 2011-07-13 ENCOUNTER — Encounter (HOSPITAL_COMMUNITY): Payer: Self-pay | Admitting: Anesthesiology

## 2011-07-13 ENCOUNTER — Encounter (HOSPITAL_COMMUNITY): Payer: Self-pay | Admitting: *Deleted

## 2011-07-13 DIAGNOSIS — N9089 Other specified noninflammatory disorders of vulva and perineum: Secondary | ICD-10-CM

## 2011-07-13 DIAGNOSIS — N843 Polyp of vulva: Secondary | ICD-10-CM | POA: Insufficient documentation

## 2011-07-13 DIAGNOSIS — N9489 Other specified conditions associated with female genital organs and menstrual cycle: Secondary | ICD-10-CM | POA: Insufficient documentation

## 2011-07-13 HISTORY — PX: VULVAR LESION REMOVAL: SHX5391

## 2011-07-13 SURGERY — VULVAR LESION
Anesthesia: Choice | Site: Vagina | Laterality: Right | Wound class: Clean Contaminated

## 2011-07-13 MED ORDER — NEOSTIGMINE METHYLSULFATE 1 MG/ML IJ SOLN
INTRAMUSCULAR | Status: AC
  Filled 2011-07-13: qty 10

## 2011-07-13 MED ORDER — ONDANSETRON HCL 4 MG/2ML IJ SOLN
INTRAMUSCULAR | Status: AC
  Filled 2011-07-13: qty 2

## 2011-07-13 MED ORDER — KETOROLAC TROMETHAMINE 30 MG/ML IJ SOLN
15.0000 mg | Freq: Once | INTRAMUSCULAR | Status: AC | PRN
  Administered 2011-07-13: 30 mg via INTRAVENOUS

## 2011-07-13 MED ORDER — MIDAZOLAM HCL 2 MG/2ML IJ SOLN
INTRAMUSCULAR | Status: AC
  Filled 2011-07-13: qty 2

## 2011-07-13 MED ORDER — PROMETHAZINE HCL 25 MG/ML IJ SOLN: 6.2500 mg | INTRAMUSCULAR | Status: DC | PRN

## 2011-07-13 MED ORDER — IBUPROFEN 800 MG PO TABS: 800.0000 mg | ORAL_TABLET | Freq: Three times a day (TID) | ORAL | Status: AC | PRN

## 2011-07-13 MED ORDER — GLYCOPYRROLATE 0.2 MG/ML IJ SOLN
INTRAMUSCULAR | Status: AC
  Filled 2011-07-13: qty 1

## 2011-07-13 MED ORDER — MIDAZOLAM HCL 5 MG/5ML IJ SOLN
INTRAMUSCULAR | Status: DC | PRN
  Administered 2011-07-13 (×2): 1 mg via INTRAVENOUS

## 2011-07-13 MED ORDER — KETOROLAC TROMETHAMINE 30 MG/ML IJ SOLN
INTRAMUSCULAR | Status: AC
  Administered 2011-07-13: 30 mg via INTRAVENOUS
  Filled 2011-07-13: qty 1

## 2011-07-13 MED ORDER — LIDOCAINE HCL (CARDIAC) 20 MG/ML IV SOLN
INTRAVENOUS | Status: AC
  Filled 2011-07-13: qty 5

## 2011-07-13 MED ORDER — ACETAMINOPHEN 325 MG PO TABS: 325.0000 mg | ORAL_TABLET | ORAL | Status: DC | PRN

## 2011-07-13 MED ORDER — LIDOCAINE HCL (PF) 1 % IJ SOLN
INTRAMUSCULAR | Status: DC | PRN
  Administered 2011-07-13: 20 mL

## 2011-07-13 MED ORDER — DEXAMETHASONE SODIUM PHOSPHATE 10 MG/ML IJ SOLN
INTRAMUSCULAR | Status: AC
  Filled 2011-07-13: qty 1

## 2011-07-13 MED ORDER — FENTANYL CITRATE 0.05 MG/ML IJ SOLN
INTRAMUSCULAR | Status: AC
  Filled 2011-07-13: qty 2

## 2011-07-13 MED ORDER — LACTATED RINGERS IV SOLN
INTRAVENOUS | Status: DC
  Administered 2011-07-13: 10:00:00 via INTRAVENOUS

## 2011-07-13 MED ORDER — PROPOFOL 10 MG/ML IV EMUL
INTRAVENOUS | Status: DC | PRN
  Administered 2011-07-13: 50 mg via INTRAVENOUS
  Administered 2011-07-13: 200 mg via INTRAVENOUS

## 2011-07-13 MED ORDER — FENTANYL CITRATE 0.05 MG/ML IJ SOLN: 25.0000 ug | INTRAMUSCULAR | Status: DC | PRN

## 2011-07-13 SURGICAL SUPPLY — 15 items
APPLICATOR COTTON TIP 6IN STRL (MISCELLANEOUS) IMPLANT
CLOTH BEACON ORANGE TIMEOUT ST (SAFETY) ×2 IMPLANT
CONTAINER PREFILL 10% NBF 15ML (MISCELLANEOUS) ×2 IMPLANT
COUNTER NEEDLE 1200 MAGNETIC (NEEDLE) IMPLANT
GLOVE BIO SURGEON STRL SZ 6.5 (GLOVE) ×2 IMPLANT
GLOVE BIOGEL PI IND STRL 7.0 (GLOVE) ×2 IMPLANT
GLOVE BIOGEL PI INDICATOR 7.0 (GLOVE) ×2
GOWN PREVENTION PLUS LG XLONG (DISPOSABLE) ×4 IMPLANT
NEEDLE SPNL 22GX3.5 QUINCKE BK (NEEDLE) ×2 IMPLANT
NS IRRIG 1000ML POUR BTL (IV SOLUTION) ×2 IMPLANT
PACK VAGINAL MINOR WOMEN LF (CUSTOM PROCEDURE TRAY) ×2 IMPLANT
PAD PREP 24X48 CUFFED NSTRL (MISCELLANEOUS) ×2 IMPLANT
SYR CONTROL 10ML LL (SYRINGE) ×2 IMPLANT
TOWEL OR 17X24 6PK STRL BLUE (TOWEL DISPOSABLE) ×4 IMPLANT
WATER STERILE IRR 1000ML POUR (IV SOLUTION) ×2 IMPLANT

## 2011-07-13 NOTE — Anesthesia Preprocedure Evaluation (Addendum)
Anesthesia Evaluation  Patient identified by MRN, date of birth, ID band Patient awake    Reviewed: Allergy & Precautions, H&P , Patient's Chart, lab work & pertinent test results, reviewed documented beta blocker date and time   History of Anesthesia Complications Negative for: history of anesthetic complications  Airway Mallampati: III TM Distance: >3 FB Neck ROM: full    Dental No notable dental hx.    Pulmonary neg pulmonary ROS, sleep apnea ,  clear to auscultation  Pulmonary exam normal       Cardiovascular Exercise Tolerance: Good hypertension, +CHF and neg cardio ROS regular Normal    Neuro/Psych Negative Neurological ROS  Negative Psych ROS   GI/Hepatic negative GI ROS, Neg liver ROS,   Endo/Other  Negative Endocrine ROSDiabetes mellitus-  Renal/GU negative Renal ROS     Musculoskeletal   Abdominal   Peds  Hematology negative hematology ROS (+)   Anesthesia Other Findings EF 25% no stenosis  Reproductive/Obstetrics negative OB ROS                          Anesthesia Physical Anesthesia Plan  ASA: III  Anesthesia Plan: MAC   Post-op Pain Management:    Induction:   Airway Management Planned:   Additional Equipment:   Intra-op Plan:   Post-operative Plan:   Informed Consent: I have reviewed the patients History and Physical, chart, labs and discussed the procedure including the risks, benefits and alternatives for the proposed anesthesia with the patient or authorized representative who has indicated his/her understanding and acceptance.   Dental Advisory Given  Plan Discussed with: CRNA, Surgeon and Anesthesiologist  Anesthesia Plan Comments: (MAC with local backup plan for GA with LMA if local inadequate.  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

## 2011-07-13 NOTE — Anesthesia Postprocedure Evaluation (Signed)
Anesthesia Post Note  Patient: Kathryn [name]  Procedure(s) Performed:  VULVAR LESION - Excision of right vulvar mass.  Anesthesia type: GA  Patient location: PACU  Post pain: Pain level controlled  Post assessment: Post-op Vital signs reviewed  Last Vitals:  Filed Vitals:   07/13/11 1100  BP: 124/94  Pulse: 89  Temp:   Resp: 18    Post vital signs: Reviewed  Level of consciousness: sedated  Complications: No apparent anesthesia complications

## 2011-07-13 NOTE — Anesthesia Procedure Notes (Signed)
Procedures

## 2011-07-13 NOTE — Brief Op Note (Signed)
07/13/2011  10:48 AM  PATIENT:  Kathryn [name]  30 y.o. female  PRE-OPERATIVE DIAGNOSIS:  vulvar mass  POST-OPERATIVE DIAGNOSIS:   vulvar mass  PROCEDURE:  Procedure(s): EXCISION OF  VULVAR MASS  SURGEON:  Surgeon(s): Damontre Millea Cathie Beams, MD  PHYSICIAN ASSISTANT:   ASSISTANTS: NONE  ANESTHESIA:   general and IV sedation  EBL:   MIN FINDINGS: PEDUNCULATED RIGHT LABIAL MAJUS SOFT TISSUE MASS(3X 2CM) BLOOD ADMINISTERED:none  DRAINS: none   LOCAL MEDICATIONS USED:  XYLOCAINE 20CC  SPECIMEN:  Source of Specimen:  VULVAR MASS  DISPOSITION OF SPECIMEN:  PATHOLOGY  COUNTS:  YES  TOURNIQUET:  * No tourniquets in log *  DICTATION: .Other Dictation: Dictation Number   PLAN OF CARE: Discharge to home after PACU  PATIENT DISPOSITION:  PACU - hemodynamically stable.   Delay start of Pharmacological VTE agent (>24hrs) due to surgical blood loss or risk of bleeding: NO

## 2011-07-13 NOTE — Transfer of Care (Signed)
Immediate Anesthesia Transfer of Care Note  Patient: Kathryn [name]  Procedure(s) Performed:  VULVAR LESION - Excision of right vulvar mass.  Patient Location: PACU  Anesthesia Type: General  Level of Consciousness: awake and alert   Airway & Oxygen Therapy: Patient Spontanous Breathing and Patient connected to nasal cannula oxygen  Post-op Assessment: Report given to PACU RN  Post vital signs: Reviewed and stable  Complications: No apparent anesthesia complications

## 2011-07-14 NOTE — Op Note (Signed)
NAMEMIRELY, PANGLE NO.:  [id number]  MEDICAL RECORD NO.:  [id number]  LOCATION:  [facility]                          FACILITY:  [facility]  PHYSICIAN:  Maxie Better, M.D.DATE OF BIRTH:  1981/07/09  DATE OF PROCEDURE:  07/13/2011 DATE OF DISCHARGE:  07/13/2011                              OPERATIVE REPORT   PREOPERATIVE DIAGNOSIS:  Vulvar mass.  POSTOPERATIVE DIAGNOSIS:  Vulvar mass.  PROCEDURE:  Excision of vulvar mass.  ANESTHESIA:  MAC, paracervical block.  SURGEON:  Maxie Better, MD  ASSISTANT:  None.  PROCEDURE:  Under adequate monitored anesthesia, the patient was placed in the dorsal lithotomy position.  She externally was sterilely prepped and draped in usual fashion.  Bladder was catheterized with small amount of urine.  The patient was notable for about a 3 x 2 cm soft tissue mass noted from the right labia major between 7 and 8 o'clock pedunculated in nature of wide-based.  Lidocaine 1% was injected around the base of the mass.  An elliptical incision was made around the base of the mass and then the mass was excised.  Cautery was used for hemostasis.  2-0 Vicryl sutures of figure-of-eight sutures were placed in the deep tissue and the skin was approximated with subcuticular 4-0 Vicryl suture. Specimen, the vulvar mass sent to pathology.  Estimated blood loss was minimal.  Complication was none.  The patient tolerated the procedure well and was transferred to the recovery room in stable condition.     Maxie Better, M.D.     [place]/MEDQ  D:  07/13/2011  T:  07/14/2011  Job:  [id number]

## 2011-07-16 ENCOUNTER — Encounter (HOSPITAL_COMMUNITY): Payer: Self-pay | Admitting: Obstetrics and Gynecology

## 2012-01-16 ENCOUNTER — Emergency Department (HOSPITAL_COMMUNITY)
Admission: EM | Admit: 2012-01-16 | Discharge: 2012-01-16 | Disposition: A | Discharge status: Home / Self Care | Payer: BC Managed Care – PPO | Source: Home / Self Care | Attending: Emergency Medicine | Admitting: Emergency Medicine

## 2012-01-16 ENCOUNTER — Encounter (HOSPITAL_COMMUNITY): Payer: Self-pay | Admitting: Emergency Medicine

## 2012-01-16 DIAGNOSIS — R300 Dysuria: Secondary | ICD-10-CM

## 2012-01-16 LAB — POCT URINALYSIS DIP (DEVICE)
Hgb urine dipstick: NEGATIVE
Nitrite: NEGATIVE
Urobilinogen, UA: 0.2 mg/dL (ref 0.0–1.0)
pH: 6.5 (ref 5.0–8.0)

## 2012-01-16 NOTE — ED Provider Notes (Signed)
History     CSN: [id number]  Arrival date & time 01/16/12  1608   First MD Initiated Contact with Patient 01/16/12 1925      Chief Complaint  Patient presents with  . Urinary Tract Infection    (Consider location/radiation/quality/duration/timing/severity/associated sxs/prior treatment) HPI Comments: Pt used to get frequent UTIs, feels like she has one again.  Last one was a year ago.    Patient is a 31 y.o. female presenting with urinary tract infection. The history is provided by the patient.  Urinary Tract Infection This is a new problem. The current episode started yesterday. Episode frequency: with urination. The problem has not changed since onset.Associated symptoms include abdominal pain. Associated symptoms comments: Reports very mild abd pain "that makes me feel like I need to pee". Exacerbated by: urinating. Nothing relieves the symptoms. Treatments tried: increasing fluid intake. The treatment provided no relief.    Past Medical History  Diagnosis Date  . Hypertension   . Hyperlipidemia   . Vitamin d deficiency   . CHF (congestive heart failure)     ECHO cardiogram schedule 07/11/11  . Sleep apnea     does not use CPAP  . Diabetes mellitus     no med - last A1C was 5.9  on 04/2011  . Anemia     history only - no current problem    Past Surgical History  Procedure Date  . Upper gastrointestinal endoscopy   . Gastric bypass 01/15/2011  . Vulvar lesion removal 07/13/2011    Procedure: VULVAR LESION;  Surgeon: Serita Kyle, MD;  Location: [facility];  Service: Gynecology;  Laterality: Right;  Excision of right vulvar mass.    History reviewed. No pertinent family history.  History  Substance Use Topics  . Smoking status: Former Smoker -- 0.2 packs/day for 1 years    Types: Cigarettes    Quit date: 12/14/2000  . Smokeless tobacco: Never Used  . Alcohol Use: Yes     Rarely - twice a year    OB History    Grav Para Term Preterm Abortions TAB SAB Ect  Mult Living                  Review of Systems  Constitutional: Negative for fever and chills.  Gastrointestinal: Positive for abdominal pain. Negative for nausea and vomiting.  Genitourinary: Positive for dysuria and urgency. Negative for frequency and flank pain.    Allergies  Floxin and Sulfa antibiotics  Home Medications   Current Outpatient Rx  Name Route Sig Dispense Refill  . ZANTAC PO Oral Take by mouth.    Marland Kitchen CARVEDILOL 12.5 MG PO TABS Oral Take 12.5 mg by mouth 2 (two) times daily with a meal.     . VITAMIN D 1000 UNITS PO TABS Oral Take 5,000 Units by mouth daily.     Marland Kitchen LISINOPRIL-HYDROCHLOROTHIAZIDE 20-12.5 MG PO TABS Oral Take 1 tablet by mouth 2 (two) times daily.      . CENTRUM PO CHEW Oral Chew 1 tablet by mouth daily.      Marland Kitchen CHILDRENS MULTIVITAMINS PO CHEW Oral Chew 1 tablet by mouth daily.     Marland Kitchen PRAVASTATIN SODIUM 10 MG PO TABS Oral Take 10 mg by mouth daily.        BP 138/96  Pulse 86  Temp 97.4 F (36.3 C) (Oral)  Resp 17  SpO2 100%  LMP 01/03/2012  Physical Exam  Constitutional: She appears well-developed and well-nourished. No distress.  Cardiovascular: Normal  rate and regular rhythm.   Pulmonary/Chest: Effort normal and breath sounds normal.  Abdominal: Soft. Bowel sounds are normal. She exhibits no distension. There is no tenderness. There is no rebound, no guarding and no CVA tenderness.    ED Course  Procedures (including critical care time)   Labs Reviewed  POCT URINALYSIS DIP (DEVICE)  POCT PREGNANCY, URINE  URINE CULTURE   No results found.   1. Dysuria       MDM  No evidence of UTI.  Given pt's sx, will send urine cx.          Cathlyn Parsons, NP 01/16/12 2113

## 2012-01-16 NOTE — ED Notes (Signed)
Reports uti symptoms onset yesterday.  Reports odor, urgency, cloudy urine

## 2012-01-17 NOTE — ED Provider Notes (Signed)
Medical screening examination/treatment/procedure(s) were performed by non-physician practitioner and as supervising physician I was immediately available for consultation/collaboration.  Corrie Reder   Kendle Turbin, MD 01/17/12 1451 

## 2012-01-18 LAB — URINE CULTURE: Culture: NO GROWTH

## 2012-02-13 ENCOUNTER — Encounter (HOSPITAL_COMMUNITY): Payer: Self-pay | Admitting: Emergency Medicine

## 2012-02-13 ENCOUNTER — Emergency Department (HOSPITAL_COMMUNITY)
Admission: EM | Admit: 2012-02-13 | Discharge: 2012-02-13 | Disposition: A | Discharge status: Home / Self Care | Payer: BC Managed Care – PPO | Source: Home / Self Care | Attending: Emergency Medicine | Admitting: Emergency Medicine

## 2012-02-13 DIAGNOSIS — N9089 Other specified noninflammatory disorders of vulva and perineum: Secondary | ICD-10-CM

## 2012-02-13 HISTORY — DX: Urinary tract infection, site not specified: N39.0

## 2012-02-13 LAB — POCT URINALYSIS DIP (DEVICE)
Bilirubin Urine: NEGATIVE
Glucose, UA: NEGATIVE mg/dL
Ketones, ur: NEGATIVE mg/dL
Leukocytes, UA: NEGATIVE
Specific Gravity, Urine: <=1.005 (ref 1.005–1.030)

## 2012-02-13 LAB — POCT PREGNANCY, URINE: Preg Test, Ur: NEGATIVE

## 2012-02-13 LAB — WET PREP, GENITAL
Trich, Wet Prep: NONE SEEN
Yeast Wet Prep HPF POC: NONE SEEN

## 2012-02-13 MED ORDER — LIDOCAINE 4 % EX GEL: 1.0000 "application " | Freq: Three times a day (TID) | CUTANEOUS | Status: DC | PRN

## 2012-02-13 MED ORDER — PHENAZOPYRIDINE HCL 200 MG PO TABS: 200.0000 mg | ORAL_TABLET | Freq: Three times a day (TID) | ORAL | Status: AC | PRN

## 2012-02-13 NOTE — ED Provider Notes (Signed)
History     CSN: [id number]  Arrival date & time 02/13/12  1518   First MD Initiated Contact with Patient 02/13/12 1616      Chief Complaint  Patient presents with  . Urinary Tract Infection    (Consider location/radiation/quality/duration/timing/severity/associated sxs/prior treatment) HPI Comments: Patient reports urinary urgency, frequency, dysuria, lower abdominal pain described as "bladder spasms" starting yesterday. No oderous or cloudy urine, hematuria. No nausea, vomiting, vaginal odor, vaginal discharge, vaginal bleeding. She said she is sexually active with 2 female partners, but that she uses condoms consistently with both of them States that the condom she is using with her partner several days ago "broke" and she took Plan B the next day. She had intercourse with a different female partner 2 days ago. They're both asymptomatic. Patient has a history of recurring UTIs. No history of gonorrhea, Chlamydia, herpes, BV, trichomonas, yeast infections, HIV, syphilis.    Patient was seen at the [facility] earlier this month for UTI like symptoms, udip & culture were negative.  ROS as noted in HPI. All other ROS negative.   Patient is a 31 y.o. female presenting with dysuria. The history is provided by the patient. No language interpreter was used.  Dysuria  This is a recurrent problem. The current episode started yesterday. The problem occurs every urination. The problem has been gradually worsening. The quality of the pain is described as burning and stabbing. The pain is mild. There has been no fever. She is sexually active. There is no history of pyelonephritis. Associated symptoms include frequency, hesitancy and urgency. Pertinent negatives include no chills, no nausea, no vomiting, no discharge, no hematuria, no possible pregnancy and no flank pain. She has tried home medications and increased fluids for the symptoms. Her past medical history is significant for recurrent UTIs.    Past  Medical History  Diagnosis Date  . Hypertension   . Hyperlipidemia   . Vitamin d deficiency   . CHF (congestive heart failure)     ECHO cardiogram schedule 07/11/11  . Sleep apnea     does not use CPAP  . Diabetes mellitus     no med - last A1C was 5.9  on 04/2011  . Anemia     history only - no current problem  . UTI (lower urinary tract infection)     Past Surgical History  Procedure Date  . Upper gastrointestinal endoscopy   . Gastric bypass 01/15/2011  . Vulvar lesion removal 07/13/2011    Procedure: VULVAR LESION;  Surgeon: Serita Kyle, MD;  Location: [facility];  Service: Gynecology;  Laterality: Right;  Excision of right vulvar mass.    History reviewed. No pertinent family history.  History  Substance Use Topics  . Smoking status: Former Smoker -- 0.2 packs/day for 1 years    Types: Cigarettes    Quit date: 12/14/2000  . Smokeless tobacco: Never Used  . Alcohol Use: Yes     Rarely - twice a year    OB History    Grav Para Term Preterm Abortions TAB SAB Ect Mult Living                  Review of Systems  Constitutional: Negative for chills.  Gastrointestinal: Negative for nausea and vomiting.  Genitourinary: Positive for dysuria, hesitancy, urgency and frequency. Negative for hematuria and flank pain.    Allergies  Floxin and Sulfa antibiotics  Home Medications   Current Outpatient Rx  Name Route Sig Dispense Refill  .  CARVEDILOL 12.5 MG PO TABS Oral Take 12.5 mg by mouth 2 (two) times daily with a meal.     . VITAMIN D 1000 UNITS PO TABS Oral Take 5,000 Units by mouth daily.     Marland Kitchen LIDOCAINE 4 % EX GEL Apply externally Apply 1 application topically 3 (three) times daily as needed. Apply with swab. Use smallest effective amount.. Do not exceed 5 gm. 10 g 0  . LISINOPRIL-HYDROCHLOROTHIAZIDE 20-12.5 MG PO TABS Oral Take 1 tablet by mouth 2 (two) times daily.      . CENTRUM PO CHEW Oral Chew 1 tablet by mouth daily.      Marland Kitchen CHILDRENS MULTIVITAMINS PO  CHEW Oral Chew 1 tablet by mouth daily.     Marland Kitchen PHENAZOPYRIDINE HCL 200 MG PO TABS Oral Take 1 tablet (200 mg total) by mouth 3 (three) times daily as needed for pain. 6 tablet 0  . PRAVASTATIN SODIUM 10 MG PO TABS Oral Take 10 mg by mouth daily.      Marland Kitchen ZANTAC PO Oral Take by mouth.      BP 119/84  Pulse 90  Temp 98.5 F (36.9 C) (Oral)  Resp 20  SpO2 98%  LMP 01/26/2012  Physical Exam  Nursing note and vitals reviewed. Constitutional: She is oriented to person, place, and time. She appears well-developed and well-nourished. No distress.  HENT:  Head: Normocephalic and atraumatic.  Eyes: EOM are normal.  Neck: Normal range of motion.  Cardiovascular: Normal rate.   Pulmonary/Chest: Effort normal.  Abdominal: Soft. Bowel sounds are normal. She exhibits no distension. There is no tenderness. There is no rebound, no guarding and no CVA tenderness.  Genitourinary: Uterus normal.    Pelvic exam was performed with patient supine. There is no rash on the right labia. There is no rash on the left labia. Uterus is not tender. Cervix exhibits discharge. Cervix exhibits no motion tenderness and no friability. Right adnexum displays no mass, no tenderness and no fullness. Left adnexum displays no mass, no tenderness and no fullness. No erythema, tenderness or bleeding around the vagina. No foreign body around the vagina. No vaginal discharge found.       Fissured vaginal mucosa underneath the urethra. See drawing. No blisters noted. Scant Thin white nonoderous  vaginal d/c.Chaperone present during exam  Musculoskeletal: Normal range of motion.  Neurological: She is alert and oriented to person, place, and time.  Skin: Skin is warm and dry.  Psychiatric: She has a normal mood and affect. Her behavior is normal. Judgment and thought content normal.    ED Course  Procedures (including critical care time)   Labs Reviewed  POCT URINALYSIS DIP (DEVICE)  POCT PREGNANCY, URINE  WET PREP, GENITAL    GC/CHLAMYDIA PROBE AMP, GENITAL  HERPES SIMPLEX VIRUS CULTURE   No results found.   1. Fissure of vulva     Results for orders placed during the hospital encounter of 02/13/12  POCT URINALYSIS DIP (DEVICE)      Component Value Range   Glucose, UA NEGATIVE  NEGATIVE mg/dL   Bilirubin Urine NEGATIVE  NEGATIVE   Ketones, ur NEGATIVE  NEGATIVE mg/dL   Specific Gravity, Urine <=1.005  1.005 - 1.030   Hgb urine dipstick NEGATIVE  NEGATIVE   pH 7.0  5.0 - 8.0   Protein, ur NEGATIVE  NEGATIVE mg/dL   Urobilinogen, UA 0.2  0.0 - 1.0 mg/dL   Nitrite NEGATIVE  NEGATIVE   Leukocytes, UA NEGATIVE  NEGATIVE  POCT PREGNANCY, URINE  Component Value Range   Preg Test, Ur NEGATIVE  NEGATIVE     MDM  Previous chart and labs reviewed. As noted in history of present illness.  H&P most consistent with dysuria secondary to vaginal mucosa fissure. Will start patient on sitz baths, topical lidocaine, pretty and. Udip is negative for UTI, and recent urine culture was negative. She may have a component of interstitial cystitis, but think that her symptoms were from the irritated vaginal mucosa. Sent off gonorrhea, Chlamydia, wet prep. Will not treat now. advised patient to refrain from intercourse until she knows lab results, and her partners are treated if necessary. She gave [place] a working phone number. Patient agrees with plan.  Luiz Blare, MD 02/13/12 [phone]

## 2012-02-13 NOTE — ED Notes (Signed)
C/o uti: burning with urination, abdominal pain, bladder spasms, slight back pain.  History of uti.  Denies vaginal discharge.  Symptoms started yesterday.  Took "overnight plan b on Saturday.

## 2012-02-13 NOTE — ED Notes (Signed)
Notified which physician to see patient.  Patient declined needing blankets

## 2012-02-15 LAB — HERPES SIMPLEX VIRUS CULTURE

## 2012-04-14 ENCOUNTER — Other Ambulatory Visit: Payer: Self-pay | Admitting: Internal Medicine

## 2012-04-14 DIAGNOSIS — R1013 Epigastric pain: Secondary | ICD-10-CM

## 2012-04-15 ENCOUNTER — Encounter (HOSPITAL_COMMUNITY): Payer: Self-pay | Admitting: Emergency Medicine

## 2012-04-15 ENCOUNTER — Inpatient Hospital Stay (HOSPITAL_COMMUNITY)
Admission: EM | Admit: 2012-04-15 | Discharge: 2012-04-18 | DRG: 557 | Disposition: A | Discharge status: Home / Self Care | Payer: BC Managed Care – PPO | Attending: Family Medicine | Admitting: Family Medicine

## 2012-04-15 ENCOUNTER — Emergency Department (HOSPITAL_COMMUNITY): Payer: BC Managed Care – PPO

## 2012-04-15 DIAGNOSIS — I517 Cardiomegaly: Secondary | ICD-10-CM

## 2012-04-15 DIAGNOSIS — Z6841 Body Mass Index (BMI) 40.0 and over, adult: Secondary | ICD-10-CM

## 2012-04-15 DIAGNOSIS — E119 Type 2 diabetes mellitus without complications: Secondary | ICD-10-CM | POA: Diagnosis present

## 2012-04-15 DIAGNOSIS — E876 Hypokalemia: Secondary | ICD-10-CM | POA: Diagnosis present

## 2012-04-15 DIAGNOSIS — R9389 Abnormal findings on diagnostic imaging of other specified body structures: Secondary | ICD-10-CM | POA: Diagnosis present

## 2012-04-15 DIAGNOSIS — K8020 Calculus of gallbladder without cholecystitis without obstruction: Secondary | ICD-10-CM

## 2012-04-15 DIAGNOSIS — I509 Heart failure, unspecified: Secondary | ICD-10-CM | POA: Diagnosis present

## 2012-04-15 DIAGNOSIS — K828 Other specified diseases of gallbladder: Secondary | ICD-10-CM

## 2012-04-15 DIAGNOSIS — Q619 Cystic kidney disease, unspecified: Secondary | ICD-10-CM

## 2012-04-15 DIAGNOSIS — N1330 Unspecified hydronephrosis: Secondary | ICD-10-CM | POA: Diagnosis present

## 2012-04-15 DIAGNOSIS — R609 Edema, unspecified: Secondary | ICD-10-CM

## 2012-04-15 DIAGNOSIS — I5022 Chronic systolic (congestive) heart failure: Secondary | ICD-10-CM | POA: Diagnosis present

## 2012-04-15 DIAGNOSIS — G4733 Obstructive sleep apnea (adult) (pediatric): Secondary | ICD-10-CM | POA: Diagnosis present

## 2012-04-15 DIAGNOSIS — K859 Acute pancreatitis without necrosis or infection, unspecified: Principal | ICD-10-CM | POA: Diagnosis present

## 2012-04-15 DIAGNOSIS — R93429 Abnormal radiologic findings on diagnostic imaging of unspecified kidney: Secondary | ICD-10-CM

## 2012-04-15 DIAGNOSIS — R319 Hematuria, unspecified: Secondary | ICD-10-CM | POA: Diagnosis present

## 2012-04-15 DIAGNOSIS — I10 Essential (primary) hypertension: Secondary | ICD-10-CM | POA: Diagnosis present

## 2012-04-15 LAB — PREGNANCY, URINE: Preg Test, Ur: NEGATIVE

## 2012-04-15 LAB — COMPREHENSIVE METABOLIC PANEL
AST: 20 U/L (ref 0–37)
Albumin: 3.3 g/dL — ABNORMAL LOW (ref 3.5–5.2)
Alkaline Phosphatase: 52 U/L (ref 39–117)
BUN: 8 mg/dL (ref 6–23)
Chloride: 100 mEq/L (ref 96–112)
Potassium: 3.8 mEq/L (ref 3.5–5.1)
Sodium: 136 mEq/L (ref 135–145)
Total Protein: 6.6 g/dL (ref 6.0–8.3)

## 2012-04-15 LAB — LIPASE, BLOOD: Lipase: 719 U/L — ABNORMAL HIGH (ref 11–59)

## 2012-04-15 LAB — CBC
HCT: 35.9 % — ABNORMAL LOW (ref 36.0–46.0)
MCH: 30.2 pg (ref 26.0–34.0)
MCV: 89.5 fL (ref 78.0–100.0)
RDW: 13.6 % (ref 11.5–15.5)
WBC: 7.4 10*3/uL (ref 4.0–10.5)

## 2012-04-15 MED ORDER — SODIUM CHLORIDE 0.9 % IV BOLUS (SEPSIS)
1000.0000 mL | Freq: Once | INTRAVENOUS | Status: AC
  Administered 2012-04-15: 1000 mL via INTRAVENOUS

## 2012-04-15 MED ORDER — PANTOPRAZOLE SODIUM 40 MG IV SOLR
40.0000 mg | Freq: Once | INTRAVENOUS | Status: AC
  Administered 2012-04-15: 40 mg via INTRAVENOUS
  Filled 2012-04-15: qty 40

## 2012-04-15 MED ORDER — HYDROMORPHONE HCL PF 1 MG/ML IJ SOLN
1.0000 mg | Freq: Once | INTRAMUSCULAR | Status: AC
  Administered 2012-04-15: 1 mg via INTRAVENOUS
  Filled 2012-04-15: qty 1

## 2012-04-15 MED ORDER — ONDANSETRON HCL 4 MG/2ML IJ SOLN
4.0000 mg | Freq: Once | INTRAMUSCULAR | Status: AC
  Administered 2012-04-15: 4 mg via INTRAVENOUS
  Filled 2012-04-15: qty 2

## 2012-04-15 NOTE — H&P (Signed)
[facility]                                                                                    Patient Demographics  Kathryn [name], is a 31 y.o. female  CSN: [id number]  MRN: [id number]  DOB - 09/04/1980  Admit Date - 04/15/2012  Outpatient Primary MD for the patient is Thayer Headings, MD   With History of -  Past Medical History  Diagnosis Date  . Hypertension   . Hyperlipidemia   . Vitamin d deficiency   . CHF (congestive heart failure)     ECHO cardiogram schedule 07/11/11  . Sleep apnea     does not use CPAP  . Diabetes mellitus     no med - last A1C was 5.9  on 04/2011  . Anemia     history only - no current problem  . UTI (lower urinary tract infection)       Past Surgical History  Procedure Date  . Upper gastrointestinal endoscopy   . Gastric bypass 01/15/2011  . Vulvar lesion removal 07/13/2011    Procedure: VULVAR LESION;  Surgeon: Serita Kyle, MD;  Location: [facility];  Service: Gynecology;  Laterality: Right;  Excision of right vulvar mass.    in for   Chief Complaint  Patient presents with  . Abdominal Pain     HPI  Kathryn [name]  is a 31 y.o. female, with a history significant for morbid obesity status post gastric bypass in July 2012 presenting today with a few days history of epigastric pain radiating to the back associated with nausea. The pain started on Friday and it wasn't related to food sometimes the patient had nausea but no vomiting no history of fever or chills. patient denies alcoholism. On the ultrasound of the abdomen which was done in the emergency room showed gallbladder sludge and her lipase was elevated at 792.   Review of Systems    In addition to the HPI above, No Fever-chills, No Headache, No changes with Vision or hearing, No problems swallowing food or Liquids, No Chest pain, Cough or Shortness of Breath, It was positive for abdominal pain and nausea but no vomiting Bowel movements are regular, No Blood  in stool or Urine, No dysuria, No new skin rashes or bruises, No new joints pains-aches,  No new weakness, tingling, numbness in any extremity, No polyuria, polydypsia or polyphagia, No significant Mental Stressors.  A full 10 point Review of Systems was done, except as stated above, all other Review of Systems were negative.   Social History History  Substance Use Topics  . Smoking status: Former Smoker -- 0.2 packs/day for 1 years    Types: Cigarettes    Quit date: 12/14/2000  . Smokeless tobacco: Never Used  . Alcohol Use: Yes     Rarely - twice a year     Family History Diabetes mellitus  Prior to Admission medications   Medication Sig Start Date End Date Taking? Authorizing Provider  carvedilol (COREG) 12.5 MG tablet Take 12.5 mg by mouth 2 (two) times daily with a meal.    Yes Historical Provider, MD  cholecalciferol (VITAMIN D) 1000 UNITS  tablet Take 5,000 Units by mouth daily.    Yes Historical Provider, MD  lisinopril-hydrochlorothiazide (PRINZIDE,ZESTORETIC) 20-12.5 MG per tablet Take 1 tablet by mouth 2 (two) times daily.     Yes Historical Provider, MD  multivitamin-iron-minerals-folic acid (CENTRUM) chewable tablet Chew 1 tablet by mouth daily.     Yes Historical Provider, MD  nitrofurantoin, macrocrystal-monohydrate, (MACROBID) 100 MG capsule Take 100 mg by mouth 2 (two) times daily.   Yes Historical Provider, MD  norgestimate-ethinyl estradiol (ORTHO-CYCLEN,SPRINTEC,PREVIFEM) 0.25-35 MG-MCG tablet Take 1 tablet by mouth daily.   Yes Historical Provider, MD  pravastatin (PRAVACHOL) 10 MG tablet Take 10 mg by mouth daily.     Yes Historical Provider, MD  ranitidine (ZANTAC) 150 MG tablet Take 150 mg by mouth 2 (two) times daily.    Yes Historical Provider, MD    Allergies  Allergen Reactions  . Floxin (Ofloxacin) Swelling    Side of face corresponding to ear being treated swelled after usage. Ok with po quinolones  . Sulfa Antibiotics Hives    Physical  Exam  Vitals  Blood pressure 148/89, pulse 92, temperature 98 F (36.7 C), temperature source Oral, resp. rate 16, last menstrual period 04/15/2012, SpO2 99.00%.   1. General African American female in moderate pain  2. Normal affect and insight, Not Suicidal or Homicidal, Awake Alert, Oriented X 3.  3. No F.N deficits, ALL C.Nerves Intact, Strength 5/5 all 4 extremities, Sensation intact all 4 extremities, Plantars down going.  4. Ears and Eyes appear Normal, Conjunctivae clear, PERRLA. Moist Oral Mucosa.  5. Supple Neck, No JVD, No cervical lymphadenopathy appriciated, No Carotid Bruits.  6. Symmetrical Chest wall movement, Good air movement bilaterally, CTAB.  7. RRR, No Gallops, Rubs or Murmurs, No Parasternal Heave.  8. decreased Bowel Sounds, Abdomen mild epigastric tenderness, No organomegaly appriciated,No rebound -guarding or rigidity.  9.  No Cyanosis, Normal Skin Turgor, No Skin Rash or Bruise.  10. Good muscle tone,  joints appear normal , no effusions, Normal ROM.  11. No Palpable Lymph Nodes in Neck or Axillae   Data Review  CBC  Lab 04/15/12 2100  WBC 7.4  HGB 12.1  HCT 35.9*  PLT 231  MCV 89.5  MCH 30.2  MCHC 33.7  RDW 13.6  LYMPHSABS --  MONOABS --  EOSABS --  BASOSABS --  BANDABS --   ------------------------------------------------------------------------------------------------------------------  Chemistries   Lab 04/15/12 2100  NA 136  K 3.8  CL 100  CO2 24  GLUCOSE 77  BUN 8  CREATININE 0.80  CALCIUM 9.0  MG --  AST 20  ALT 12  ALKPHOS 52  BILITOT 0.3     Imaging results:   US Abdomen Complete  04/15/2012  *RADIOLOGY REPORT*  Clinical Data: Abdominal pain.  History of gastric bypass.  ABDOMEN ULTRASOUND  Technique:  Complete abdominal ultrasound examination was performed including evaluation of the liver, gallbladder, bile ducts, pancreas, kidneys, spleen, IVC, and abdominal aorta.  Comparison: No comparison studies  available.  Findings:  Gallbladder:  Minimal layering sludge in the lumen.  No gallstones are evident.  No gallbladder wall thickening or pericholecystic fluid.  The sonographer reports no sonographic Murphy's sign.  Common Bile Duct:  Nondilated at 5 mm diameter.  Liver:  No focal intrahepatic parenchymal abnormality.  No intrahepatic biliary duct dilatation.  IVC:  Normal.  Pancreas:  Obscured by overlying bowel gas.  Spleen:  Normal.  Right kidney:  15 cm in long axis.  No definite normal right renal parenchyma  can be identified.  There is a large reniform collection of cysts in the expected location of the right kidney.  This may be related to advanced chronic hydronephrosis or multicystic disease of the right kidney.  Left kidney:  15.2 cm in long axis.  There is some fullness of the intrarenal collecting system and renal pelvis is prominent. Imaging features suggest mild hydronephrosis.  Abdominal Aorta:  No aneurysm.  IMPRESSION: Gallbladder sludge without gallbladder wall thickening or evidence of gallstones.New  Collection of cysts identified in the expected location of the right kidney.  This may represent chronic advanced hydronephrosis or polycystic change.  Mild hydronephrosis of the relatively normal-appearing left kidney. No etiology for the hydronephrosis is evident.   Original Report Authenticated By: ERIC A. MANSELL, M.D.      Assessment & Plan  Principal Problem:  *Pancreatitis, acute  Assessment;  1. acute pancreatitis patient denies any history of alcoholism please note that ultrasound of the abdomen showing gallbladder sludge, the patient had gastric bypass 15 months ago. This is her first episode. 2. Morbid obesity status post gastric bypass 3. Hypertension we'll change her medications to Lopressor IV 4. Diabetes mellitus not on any medications after she lost 200 pounds 5. Congestive heart failure status post echo on 1212 6. Hyperlipidemia 7. Vitamin D deficiency 8.  Anemia 9. History of vulvar lesion removed.  Plan : Keep patient n.p.o. IV fluids, IV pain medications, change blood pressure medications to IV, advance diet later, consult surgery in the a.m. for probable cholecystectomy. No antibiotics will be given since the patient is afebrile and the ultrasound does not show any significant abnormalities.    DVT Prophylaxis Lovenox   AM Labs Ordered, also please review Full Orders  Family Communication: Admission, patients condition and plan of care including tests being ordered have been discussed with the patient  who indicate understanding and agree with the plan and Code Status.  Code Status full Disposition Plan: Home Time spent in minutes : 38  Condition fair

## 2012-04-15 NOTE — ED Notes (Signed)
Pt alert, arrives from home, c/o sharp abd pain, onset was Friday, states home remedies w/o relief, states een in Urgent Care, treated for UTI, cont pain with eating. Seen PCP Monday, has f/u Thursday

## 2012-04-15 NOTE — ED Provider Notes (Signed)
History     CSN: [id number]  Arrival date & time 04/15/12  1851   First MD Initiated Contact with Patient 04/15/12 2046      Chief Complaint  Patient presents with  . Abdominal Pain    (Consider location/radiation/quality/duration/timing/severity/associated sxs/prior treatment) Patient is a 31 y.o. female presenting with abdominal pain. The history is provided by the patient.  Abdominal Pain The primary symptoms of the illness include abdominal pain. The primary symptoms of the illness do not include fever, shortness of breath, vomiting or dysuria.  Symptoms associated with the illness do not include chills or back pain.  pt states for past few days pain upper abdomen, epigastric area. Dull, non radiating. Occasional mild nausea. No vomiting. Normal appetite. Having normal bms, no diarrhea or constipation. Occasionally worse w eating. No other specific exacerbating or alleviating factors. No hx pud. No hx gallstones. No hx pancreatitis. Has had gastric bypass surgery 2012 - denies any complications. No back or flank pain. No dysuria or gu c/o. No vaginal discharge. On period now, normal. No mid to lower abdominal pain. No pelvic pain. Denies fever or chills. No cough or uri c/o. No chest pain or sob.      Past Medical History  Diagnosis Date  . Hypertension   . Hyperlipidemia   . Vitamin d deficiency   . CHF (congestive heart failure)     ECHO cardiogram schedule 07/11/11  . Sleep apnea     does not use CPAP  . Diabetes mellitus     no med - last A1C was 5.9  on 04/2011  . Anemia     history only - no current problem  . UTI (lower urinary tract infection)     Past Surgical History  Procedure Date  . Upper gastrointestinal endoscopy   . Gastric bypass 01/15/2011  . Vulvar lesion removal 07/13/2011    Procedure: VULVAR LESION;  Surgeon: Serita Kyle, MD;  Location: [facility];  Service: Gynecology;  Laterality: Right;  Excision of right vulvar mass.    No family  history on file.  History  Substance Use Topics  . Smoking status: Former Smoker -- 0.2 packs/day for 1 years    Types: Cigarettes    Quit date: 12/14/2000  . Smokeless tobacco: Never Used  . Alcohol Use: Yes     Rarely - twice a year    OB History    Grav Para Term Preterm Abortions TAB SAB Ect Mult Living                  Review of Systems  Constitutional: Negative for fever and chills.  HENT: Negative for neck pain.   Eyes: Negative for redness.  Respiratory: Negative for cough and shortness of breath.   Cardiovascular: Negative for chest pain.  Gastrointestinal: Positive for abdominal pain. Negative for vomiting.  Genitourinary: Negative for dysuria and flank pain.  Musculoskeletal: Negative for back pain.  Skin: Negative for rash.  Neurological: Negative for headaches.  Hematological: Does not bruise/bleed easily.  Psychiatric/Behavioral: Negative for confusion.    Allergies  Floxin and Sulfa antibiotics  Home Medications   Current Outpatient Rx  Name Route Sig Dispense Refill  . CARVEDILOL 12.5 MG PO TABS Oral Take 12.5 mg by mouth 2 (two) times daily with a meal.     . VITAMIN D 1000 UNITS PO TABS Oral Take 5,000 Units by mouth daily.     Marland Kitchen LISINOPRIL-HYDROCHLOROTHIAZIDE 20-12.5 MG PO TABS Oral Take 1 tablet by mouth  2 (two) times daily.      . CENTRUM PO CHEW Oral Chew 1 tablet by mouth daily.      Marland Kitchen NITROFURANTOIN MONOHYD MACRO 100 MG PO CAPS Oral Take 100 mg by mouth 2 (two) times daily.    Marland Kitchen NORGESTIMATE-ETH ESTRADIOL 0.25-35 MG-MCG PO TABS Oral Take 1 tablet by mouth daily.    Marland Kitchen PRAVASTATIN SODIUM 10 MG PO TABS Oral Take 10 mg by mouth daily.      Marland Kitchen RANITIDINE HCL 150 MG PO TABS Oral Take 150 mg by mouth 2 (two) times daily.       BP 148/89  Pulse 92  Temp 98 F (36.7 C) (Oral)  Resp 16  SpO2 99%  LMP 04/15/2012  Physical Exam  Nursing note and vitals reviewed. Constitutional: She appears well-developed and well-nourished. No distress.  Eyes:  Conjunctivae normal are normal. No scleral icterus.  Neck: Neck supple. No tracheal deviation present.  Cardiovascular: Normal rate, regular rhythm, normal heart sounds and intact distal pulses.   Pulmonary/Chest: Effort normal and breath sounds normal. No respiratory distress.  Abdominal: Soft. Normal appearance and bowel sounds are normal. She exhibits no distension and no mass. There is tenderness. There is no rebound and no guarding.       Obese. Epigastric tenderness. No rebound or guarding. No hernias.     Genitourinary:       No cva tenderness  Musculoskeletal: She exhibits no edema.  Neurological: She is alert.  Skin: Skin is warm and dry. No rash noted.  Psychiatric: She has a normal mood and affect.    ED Course  Procedures (including critical care time)   Labs Reviewed  LIPASE, BLOOD  PREGNANCY, URINE  COMPREHENSIVE METABOLIC PANEL  CBC    Results for orders placed during the hospital encounter of 04/15/12  LIPASE, BLOOD      Component Value Range   Lipase 719 (*) 11 - 59 U/L  COMPREHENSIVE METABOLIC PANEL      Component Value Range   Sodium 136  135 - 145 mEq/L   Potassium 3.8  3.5 - 5.1 mEq/L   Chloride 100  96 - 112 mEq/L   CO2 24  19 - 32 mEq/L   Glucose, Bld 77  70 - 99 mg/dL   BUN 8  6 - 23 mg/dL   Creatinine, Ser 5.40  0.50 - 1.10 mg/dL   Calcium 9.0  8.4 - 98.1 mg/dL   Total Protein 6.6  6.0 - 8.3 g/dL   Albumin 3.3 (*) 3.5 - 5.2 g/dL   AST 20  0 - 37 U/L   ALT 12  0 - 35 U/L   Alkaline Phosphatase 52  39 - 117 U/L   Total Bilirubin 0.3  0.3 - 1.2 mg/dL   GFR calc non Af Amer >90  >90 mL/min   GFR calc Af Amer >90  >90 mL/min  CBC      Component Value Range   WBC 7.4  4.0 - 10.5 K/uL   RBC 4.01  3.87 - 5.11 MIL/uL   Hemoglobin 12.1  12.0 - 15.0 g/dL   HCT 19.1 (*) 47.8 - 29.5 %   MCV 89.5  78.0 - 100.0 fL   MCH 30.2  26.0 - 34.0 pg   MCHC 33.7  30.0 - 36.0 g/dL   RDW 62.1  30.8 - 65.7 %   Platelets 231  150 - 400 K/uL   US Abdomen  Complete  04/15/2012  *RADIOLOGY REPORT*  Clinical  Data: Abdominal pain.  History of gastric bypass.  ABDOMEN ULTRASOUND  Technique:  Complete abdominal ultrasound examination was performed including evaluation of the liver, gallbladder, bile ducts, pancreas, kidneys, spleen, IVC, and abdominal aorta.  Comparison: No comparison studies available.  Findings:  Gallbladder:  Minimal layering sludge in the lumen.  No gallstones are evident.  No gallbladder wall thickening or pericholecystic fluid.  The sonographer reports no sonographic Murphy's sign.  Common Bile Duct:  Nondilated at 5 mm diameter.  Liver:  No focal intrahepatic parenchymal abnormality.  No intrahepatic biliary duct dilatation.  IVC:  Normal.  Pancreas:  Obscured by overlying bowel gas.  Spleen:  Normal.  Right kidney:  15 cm in long axis.  No definite normal right renal parenchyma can be identified.  There is a large reniform collection of cysts in the expected location of the right kidney.  This may be related to advanced chronic hydronephrosis or multicystic disease of the right kidney.  Left kidney:  15.2 cm in long axis.  There is some fullness of the intrarenal collecting system and renal pelvis is prominent. Imaging features suggest mild hydronephrosis.  Abdominal Aorta:  No aneurysm.  IMPRESSION: Gallbladder sludge without gallbladder wall thickening or evidence of gallstones.New  Collection of cysts identified in the expected location of the right kidney.  This may represent chronic advanced hydronephrosis or polycystic change.  Mild hydronephrosis of the relatively normal-appearing left kidney. No etiology for the hydronephrosis is evident.   Original Report Authenticated By: ERIC A. MANSELL, M.D.       MDM  Labs. Ultrasound.   Iv ns bolus. Dilaudid iv, protonix iv, zofran iv  U/s w gb sludge.  Labs c/w pancreatitis.          Suzi Roots, MD 04/18/12 [phone]

## 2012-04-16 ENCOUNTER — Encounter (HOSPITAL_COMMUNITY): Payer: Self-pay | Admitting: *Deleted

## 2012-04-16 DIAGNOSIS — I509 Heart failure, unspecified: Secondary | ICD-10-CM

## 2012-04-16 DIAGNOSIS — R93429 Abnormal radiologic findings on diagnostic imaging of unspecified kidney: Secondary | ICD-10-CM

## 2012-04-16 DIAGNOSIS — I5022 Chronic systolic (congestive) heart failure: Secondary | ICD-10-CM

## 2012-04-16 DIAGNOSIS — E119 Type 2 diabetes mellitus without complications: Secondary | ICD-10-CM

## 2012-04-16 DIAGNOSIS — R9389 Abnormal findings on diagnostic imaging of other specified body structures: Secondary | ICD-10-CM

## 2012-04-16 DIAGNOSIS — K859 Acute pancreatitis without necrosis or infection, unspecified: Principal | ICD-10-CM

## 2012-04-16 DIAGNOSIS — I10 Essential (primary) hypertension: Secondary | ICD-10-CM

## 2012-04-16 LAB — COMPREHENSIVE METABOLIC PANEL
ALT: 8 U/L (ref 0–35)
AST: 12 U/L (ref 0–37)
Albumin: 2.7 g/dL — ABNORMAL LOW (ref 3.5–5.2)
Alkaline Phosphatase: 45 U/L (ref 39–117)
Chloride: 102 mEq/L (ref 96–112)
Potassium: 3.4 mEq/L — ABNORMAL LOW (ref 3.5–5.1)
Sodium: 136 mEq/L (ref 135–145)
Total Protein: 5.7 g/dL — ABNORMAL LOW (ref 6.0–8.3)

## 2012-04-16 LAB — CBC
HCT: 32.6 % — ABNORMAL LOW (ref 36.0–46.0)
MCH: 30.3 pg (ref 26.0–34.0)
MCHC: 33.7 g/dL (ref 30.0–36.0)
MCV: 89.8 fL (ref 78.0–100.0)
RDW: 13.5 % (ref 11.5–15.5)

## 2012-04-16 LAB — GLUCOSE, CAPILLARY
Glucose-Capillary: 74 mg/dL (ref 70–99)
Glucose-Capillary: 75 mg/dL (ref 70–99)

## 2012-04-16 MED ORDER — ONDANSETRON HCL 4 MG/2ML IJ SOLN
4.0000 mg | Freq: Four times a day (QID) | INTRAMUSCULAR | Status: DC | PRN
  Administered 2012-04-16: 4 mg via INTRAVENOUS
  Filled 2012-04-16: qty 2

## 2012-04-16 MED ORDER — THIAMINE HCL 100 MG/ML IJ SOLN
Freq: Once | INTRAVENOUS | Status: AC
  Administered 2012-04-16: 02:00:00 via INTRAVENOUS
  Filled 2012-04-16: qty 1000

## 2012-04-16 MED ORDER — METOPROLOL TARTRATE 1 MG/ML IV SOLN
2.5000 mg | Freq: Three times a day (TID) | INTRAVENOUS | Status: DC
  Administered 2012-04-16 (×2): 2.5 mg via INTRAVENOUS
  Filled 2012-04-16 (×5): qty 5

## 2012-04-16 MED ORDER — HYDROCODONE-ACETAMINOPHEN 5-325 MG PO TABS
1.0000 | ORAL_TABLET | Freq: Four times a day (QID) | ORAL | Status: DC | PRN
  Administered 2012-04-16 – 2012-04-17 (×5): 1 via ORAL
  Filled 2012-04-16 (×5): qty 1

## 2012-04-16 MED ORDER — ACETAMINOPHEN 325 MG PO TABS
650.0000 mg | ORAL_TABLET | Freq: Four times a day (QID) | ORAL | Status: DC | PRN
  Administered 2012-04-16 – 2012-04-18 (×4): 650 mg via ORAL
  Filled 2012-04-16 (×4): qty 2

## 2012-04-16 MED ORDER — CARVEDILOL 12.5 MG PO TABS
12.5000 mg | ORAL_TABLET | Freq: Two times a day (BID) | ORAL | Status: DC
  Administered 2012-04-16 – 2012-04-18 (×4): 12.5 mg via ORAL
  Filled 2012-04-16 (×6): qty 1

## 2012-04-16 MED ORDER — PANTOPRAZOLE SODIUM 40 MG IV SOLR
40.0000 mg | Freq: Every day | INTRAVENOUS | Status: DC
  Filled 2012-04-16: qty 40

## 2012-04-16 MED ORDER — KCL IN DEXTROSE-NACL 10-5-0.45 MEQ/L-%-% IV SOLN
INTRAVENOUS | Status: DC
  Filled 2012-04-16 (×2): qty 1000

## 2012-04-16 MED ORDER — POTASSIUM CHLORIDE CRYS ER 20 MEQ PO TBCR
40.0000 meq | EXTENDED_RELEASE_TABLET | Freq: Two times a day (BID) | ORAL | Status: AC
  Administered 2012-04-16 (×2): 40 meq via ORAL
  Filled 2012-04-16 (×2): qty 2

## 2012-04-16 MED ORDER — ENOXAPARIN SODIUM 40 MG/0.4ML ~~LOC~~ SOLN
40.0000 mg | SUBCUTANEOUS | Status: DC
  Administered 2012-04-16 – 2012-04-18 (×3): 40 mg via SUBCUTANEOUS
  Filled 2012-04-16 (×3): qty 0.4

## 2012-04-16 MED ORDER — INSULIN ASPART 100 UNIT/ML ~~LOC~~ SOLN
0.0000 [IU] | Freq: Three times a day (TID) | SUBCUTANEOUS | Status: DC
  Administered 2012-04-18: 1 [IU] via SUBCUTANEOUS

## 2012-04-16 MED ORDER — LISINOPRIL 20 MG PO TABS
20.0000 mg | ORAL_TABLET | Freq: Every day | ORAL | Status: DC
  Administered 2012-04-17 – 2012-04-18 (×2): 20 mg via ORAL
  Filled 2012-04-16 (×2): qty 1

## 2012-04-16 MED ORDER — HYDROMORPHONE HCL PF 1 MG/ML IJ SOLN
1.0000 mg | INTRAMUSCULAR | Status: DC | PRN
  Administered 2012-04-16: 1 mg via INTRAVENOUS
  Filled 2012-04-16: qty 1

## 2012-04-16 NOTE — Progress Notes (Signed)
[facility] PROGRESS NOTE  Deetya Drouillard OZH:[id number] DOB: 1981/01/27 DOA: 04/15/2012 PCP: Thayer Headings, MD  Assessment/Plan: 1. Acute pancreatitis--symptomatically improved, lipase down. Trial clear liquids. Etiology unclear--denies significant alcohol. Check lipid panel. Abdominal ultrasound of gallbladder unremarkable, CBD not dilated, LFTs normal. Drugs--stop pravachol as most likely agent by UTD. Other agents lisinopril/HCTZ, nitrofurantoin, ranitidine possible, but less likely. 2. Hypokalemia--replete. 3. 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 Has been seen by urology here in past. Suspect these findings are chronic. Patient asymptomatic, follow-up as outpatient. Cysts right kidney, may represent chronic advanced hydronephrosis or polycystic change. Mild hydronephrosis of the relatively normal-appearing left kidney. No etiology for the hydronephrosis is evident. 4. HTN--stable. 5. DM type 2, diet controlled--start SSI, CBG checks, check Hgb A1c. 6. OSA--does not use CPAP. 7. History of systolic CHF--LVEF 25-30% by echo 06/2011, asymptomatic. Continue Coreg, lisinopril. Not on diuretics. Follow-up with PCP, cardiology. 8. Morbid obesity--history of gastric bypass.  Code Status: Full code Family Communication: none present Disposition Plan: home when improve, 48-72 hours  Brendia Sacks, MD  [facility] Team 6 Pager [phone]. If 8PM-8AM, please contact night-coverage at [email or web address], password [facility] 04/16/2012, 11:40 AM  LOS: 1 day   Brief narrative: 31 year old woman presented to ED with complaint of sharp abd pain, states seen in Urgent Care, treated for UTI, cont pain with eating. Diagnosed with pancreatitis.  Consultants:    Procedures:    HPI/Subjective: Feels better, no n/v/abdominal pain. Wants to eat.  Objective: Filed Vitals:   04/15/12 1940 04/16/12 0039 04/16/12 0046 04/16/12 0500  BP: 148/89 147/91 126/79  129/79  Pulse: 92 81 75 77  Temp: 98 F (36.7 C) 98.3 F (36.8 C) 98.5 F (36.9 C) 97.6 F (36.4 C)  TempSrc: Oral Oral Oral Oral  Resp: 16 20 18 18   Height:   5\' 5"  (1.651 m)   Weight:   113.399 kg (250 lb)   SpO2: 99% 98% 97% 92%    Intake/Output Summary (Last 24 hours) at 04/16/12 1140 Last data filed at 04/16/12 0835  Gross per 24 hour  Intake      0 ml  Output      1 ml  Net     -1 ml   Filed Weights   04/16/12 0046  Weight: 113.399 kg (250 lb)    Exam:  General:  Appears calm and comfortable, well-appearing. Cardiovascular: RRR, no m/r/g. No LE edema.  Respiratory: CTA bilaterally, no w/r/r. Normal respiratory effort. Abdomen: soft, ntnd Psychiatric: grossly normal mood and affect, speech fluent and appropriate  Data Reviewed: Basic Metabolic Panel:  Lab 04/16/12 1324 04/15/12 2100  NA 136 136  K 3.4* 3.8  CL 102 100  CO2 27 24  GLUCOSE 76 77  BUN 7 8  CREATININE 0.80 0.80  CALCIUM 8.5 9.0  MG -- --  PHOS -- --   Liver Function Tests:  Lab 04/16/12 0450 04/15/12 2100  AST 12 20  ALT 8 12  ALKPHOS 45 52  BILITOT 0.2* 0.3  PROT 5.7* 6.6  ALBUMIN 2.7* 3.3*    Lab 04/16/12 0450 04/15/12 2100  LIPASE 448* 719*  AMYLASE -- --   CBC:  Lab 04/16/12 0450 04/15/12 2100  WBC 5.3 7.4  NEUTROABS -- --  HGB 11.0* 12.1  HCT 32.6* 35.9*  MCV 89.8 89.5  PLT 230 231   CBG: No results found for this basename: GLUCAP:5 in the last 168 hours  No results found for this or any previous visit (from the past  240 hour(s)).   Studies: US Abdomen Complete  04/15/2012  *RADIOLOGY REPORT*  Clinical Data: Abdominal pain.  History of gastric bypass.  ABDOMEN ULTRASOUND  Technique:  Complete abdominal ultrasound examination was performed including evaluation of the liver, gallbladder, bile ducts, pancreas, kidneys, spleen, IVC, and abdominal aorta.  Comparison: No comparison studies available.  Findings:  Gallbladder:  Minimal layering sludge in the lumen.  No  gallstones are evident.  No gallbladder wall thickening or pericholecystic fluid.  The sonographer reports no sonographic Murphy's sign.  Common Bile Duct:  Nondilated at 5 mm diameter.  Liver:  No focal intrahepatic parenchymal abnormality.  No intrahepatic biliary duct dilatation.  IVC:  Normal.  Pancreas:  Obscured by overlying bowel gas.  Spleen:  Normal.  Right kidney:  15 cm in long axis.  No definite normal right renal parenchyma can be identified.  There is a large reniform collection of cysts in the expected location of the right kidney.  This may be related to advanced chronic hydronephrosis or multicystic disease of the right kidney.  Left kidney:  15.2 cm in long axis.  There is some fullness of the intrarenal collecting system and renal pelvis is prominent. Imaging features suggest mild hydronephrosis.  Abdominal Aorta:  No aneurysm.  IMPRESSION: Gallbladder sludge without gallbladder wall thickening or evidence of gallstones.New  Collection of cysts identified in the expected location of the right kidney.  This may represent chronic advanced hydronephrosis or polycystic change.  Mild hydronephrosis of the relatively normal-appearing left kidney. No etiology for the hydronephrosis is evident.   Original Report Authenticated By: ERIC A. MANSELL, M.D.     Scheduled Meds:   . enoxaparin (LOVENOX) injection  40 mg Subcutaneous Q24H  . HYDROmorphone  1 mg Intravenous Once  . metoprolol  2.5 mg Intravenous Q8H  . ondansetron (ZOFRAN) IV  4 mg Intravenous Once  . pantoprazole (PROTONIX) IV  40 mg Intravenous Once  . pantoprazole (PROTONIX) IV  40 mg Intravenous QHS  . general admission iv infusion   Intravenous Once  . sodium chloride  1,000 mL Intravenous Once   Continuous Infusions:   . dextrose 5 % and 0.45 % NaCl with KCl 10 mEq/L      Principal Problem:  *Pancreatitis, acute     Brendia Sacks, MD  [facility] Team 6 Pager [phone]. If 8PM-8AM, please contact  night-coverage at [email or web address], password [facility] 04/16/2012, 11:40 AM  LOS: 1 day   Time spent: 25 minutes

## 2012-04-16 NOTE — ED Notes (Signed)
Pt transported to unit.

## 2012-04-16 NOTE — ED Notes (Signed)
Pt to transport to floor.

## 2012-04-17 ENCOUNTER — Other Ambulatory Visit: Payer: BC Managed Care – PPO

## 2012-04-17 LAB — GLUCOSE, CAPILLARY
Glucose-Capillary: 90 mg/dL (ref 70–99)
Glucose-Capillary: 132 mg/dL — ABNORMAL HIGH (ref 70–99)

## 2012-04-17 LAB — COMPREHENSIVE METABOLIC PANEL
ALT: 8 U/L (ref 0–35)
BUN: 6 mg/dL (ref 6–23)
CO2: 25 mEq/L (ref 19–32)
Calcium: 8.8 mg/dL (ref 8.4–10.5)
GFR calc Af Amer: >90 mL/min (ref >90)
GFR calc non Af Amer: >90 mL/min (ref >90)
Glucose, Bld: 72 mg/dL (ref 70–99)
Sodium: 135 mEq/L (ref 135–145)

## 2012-04-17 LAB — HEMOGLOBIN A1C
Hgb A1c MFr Bld: 5.4 % (ref <5.7)
Mean Plasma Glucose: 108 mg/dL (ref <117)

## 2012-04-17 LAB — LIPID PANEL: HDL: 38 mg/dL — ABNORMAL LOW (ref >39)

## 2012-04-17 NOTE — Progress Notes (Signed)
[facility] PROGRESS NOTE  Kathryn Kathryn [name] WUJ:[id number] DOB: Jun 22, 1981 DOA: 04/15/2012 PCP: Thayer Headings, MD  Assessment/Plan: 1. Acute pancreatitis--symptomatically resolved, lipase continues to trend downwards. Tolerating clear liquids, advance to full, if tolerates then regular diet in AM. Etiology unclear--denies significant alcohol. Lipid panel unremarkable. Abdominal ultrasound of gallbladder unremarkable, CBD not dilated, LFTs normal. Drugs--stopped pravachol as most likely agent by UTD. Other agents lisinopril/HCTZ, nitrofurantoin, ranitidine possible, but less likely. 2. Hypokalemia--repleted. 3. 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 Has been seen by urology here in past. Suspect these findings are chronic. Patient asymptomatic, follow-up as outpatient. Cysts right kidney, may represent chronic advanced hydronephrosis or polycystic change. Mild hydronephrosis of the relatively normal-appearing left kidney. No etiology for the hydronephrosis is evident. Follow-up with urology as an outpatient. 4. HTN--stable. 5. DM type 2, diet controlled--stable, continue SSI, Hgb A1c 5.4. 6. OSA--does not use CPAP. 7. History of systolic CHF--LVEF 25-30% by echo 06/2011, asymptomatic. Continue Coreg, lisinopril. Not on diuretics. Follow-up with PCP, cardiology. 8. Morbid obesity--history of gastric bypass.  Code Status: Full code Family Communication: none present Disposition Plan: home when improved, likely 10/4.  Brendia Sacks, MD  [facility] Team 6 Pager [phone]. If 8PM-8AM, please contact night-coverage at [email or web address], password [facility] 04/17/2012, 3:13 PM  LOS: 2 days   Brief narrative: 31 year old Kathryn [name] presented to ED with complaint of sharp abd pain, states seen in Urgent Care, treated for UTI, cont pain with eating. Diagnosed with pancreatitis.  Consultants:    Procedures:    HPI/Subjective: No complaints, no n/v. Minimal  abdominal pain. Wants to advance diet.  Objective: Filed Vitals:   04/16/12 2138 04/17/12 0600 04/17/12 0900 04/17/12 1030  BP: 149/97 134/86 142/84 151/96  Pulse: 78 78 75 69  Temp: 98.4 F (Kathryn.9 C) 97.9 F (Kathryn.6 C) 97.5 F (Kathryn.4 C) 98 F (Kathryn.7 C)  TempSrc: Oral Oral Oral Oral  Resp: 20 18 17 18   Height:      Weight:      SpO2: 95% 97% 100% 100%    Intake/Output Summary (Last 24 hours) at 04/17/12 1513 Last data filed at 04/17/12 1000  Gross per 24 hour  Intake   1200 ml  Output      0 ml  Net   1200 ml   Filed Weights   04/16/12 0046  Weight: 113.399 kg (250 lb)    Exam:  General:  Appears calm and comfortable, well-appearing. Cardiovascular: RRR, no m/r/g. No LE edema.  Respiratory: CTA bilaterally, no w/r/r. Normal respiratory effort. Abdomen: soft, ntnd  Data Reviewed: Basic Metabolic Panel:  Lab 04/17/12 8657 04/16/12 0450 04/15/12 2100  NA 135 136 136  K 3.9 3.4* 3.8  CL 102 102 100  CO2 25 27 24   GLUCOSE 72 76 77  BUN 6 7 8   CREATININE 0.74 0.80 0.80  CALCIUM 8.8 8.5 9.0  MG -- -- --  PHOS -- -- --   Liver Function Tests:  Lab 04/17/12 0445 04/16/12 0450 04/15/12 2100  AST 11 12 20   ALT 8 8 12   ALKPHOS 45 45 52  BILITOT 0.3 0.2* 0.3  PROT 6.0 5.7* 6.6  ALBUMIN 2.8* 2.7* 3.3*    Lab 04/17/12 0445 04/16/12 0450 04/15/12 2100  LIPASE 228* 448* 719*  AMYLASE -- -- --   CBC:  Lab 04/16/12 0450 04/15/12 2100  WBC 5.3 7.4  NEUTROABS -- --  HGB 11.0* 12.1  HCT 32.6* 35.9*  MCV 89.8 89.5  PLT 230 231   CBG:  Lab 04/17/12 1234 04/17/12 1138 04/17/12 0711 04/16/12 2136 04/16/12 1831  GLUCAP 132* 77 90 75 114*    Studies: US Abdomen Complete  04/15/2012  *RADIOLOGY REPORT*  Clinical Data: Abdominal pain.  History of gastric bypass.  ABDOMEN ULTRASOUND  Technique:  Complete abdominal ultrasound examination was performed including evaluation of the liver, gallbladder, bile ducts, pancreas, kidneys, spleen, IVC, and abdominal aorta.   Comparison: No comparison studies available.  Findings:  Gallbladder:  Minimal layering sludge in the lumen.  No gallstones are evident.  No gallbladder wall thickening or pericholecystic fluid.  The sonographer reports no sonographic Murphy's sign.  Common Bile Duct:  Nondilated at 5 mm diameter.  Liver:  No focal intrahepatic parenchymal abnormality.  No intrahepatic biliary duct dilatation.  IVC:  Normal.  Pancreas:  Obscured by overlying bowel gas.  Spleen:  Normal.  Right kidney:  15 cm in long axis.  No definite normal right renal parenchyma can be identified.  There is a large reniform collection of cysts in the expected location of the right kidney.  This may be related to advanced chronic hydronephrosis or multicystic disease of the right kidney.  Left kidney:  15.2 cm in long axis.  There is some fullness of the intrarenal collecting system and renal pelvis is prominent. Imaging features suggest mild hydronephrosis.  Abdominal Aorta:  No aneurysm.  IMPRESSION: Gallbladder sludge without gallbladder wall thickening or evidence of gallstones.New  Collection of cysts identified in the expected location of the right kidney.  This may represent chronic advanced hydronephrosis or polycystic change.  Mild hydronephrosis of the relatively normal-appearing left kidney. No etiology for the hydronephrosis is evident.   Original Report Authenticated By: ERIC A. MANSELL, M.D.     Scheduled Meds:    . carvedilol  12.5 mg Oral BID WC  . enoxaparin (LOVENOX) injection  40 mg Subcutaneous Q24H  . insulin aspart  0-9 Units Subcutaneous TID WC  . lisinopril  20 mg Oral Daily  . potassium chloride  40 mEq Oral BID   Continuous Infusions:   Principal Problem:  *Pancreatitis, acute Active Problems:  Morbid obesity  DM type 2 (diabetes mellitus, type 2)  Chronic systolic CHF (congestive heart failure)  Abnormal ultrasound of kidney     Brendia Sacks, MD  [facility] Team 6 Pager [phone]. If  8PM-8AM, please contact night-coverage at [email or web address], password [facility] 04/17/2012, 3:13 PM  LOS: 2 days   Time spent: 20 minutes

## 2012-04-18 LAB — GLUCOSE, CAPILLARY: Glucose-Capillary: 74 mg/dL (ref 70–99)

## 2012-04-18 LAB — LIPASE, BLOOD: Lipase: 217 U/L — ABNORMAL HIGH (ref 11–59)

## 2012-04-18 LAB — URINALYSIS, ROUTINE W REFLEX MICROSCOPIC
Nitrite: NEGATIVE
Specific Gravity, Urine: 1.011 (ref 1.005–1.030)
pH: 7 (ref 5.0–8.0)

## 2012-04-18 LAB — URINE MICROSCOPIC-ADD ON

## 2012-04-18 MED ORDER — AMOXICILLIN 500 MG PO CAPS: 500.0000 mg | ORAL_CAPSULE | Freq: Three times a day (TID) | ORAL | Status: DC

## 2012-04-18 NOTE — Discharge Summary (Signed)
Physician Discharge Summary  Kathryn [name] ZOX:[id number] DOB: 25-Oct-1980 DOA: 04/15/2012  PCP: Kathryn Headings, MD  Admit date: 04/15/2012 Discharge date: 04/18/2012  Recommendations for Outpatient Follow-up:  1. Follow-up resolution of acute pancreatitis 2. Follow-up abnormal kidney ultrasound (see below) and resolution of UTI/hematuria.  3. Consider outpatient urology referral  Follow-up Information    Follow up with Kathryn [facility], MD. In 1 week.   Contact information:   [address] [place] [address] [phone]         Discharge Diagnoses:  1. Acute pancreatitis 2. Hypokalemia  3. Abnormal kidney ultrasound 4. Hematuria 5. HTN 6. DM type 2, diet controlled 7. OSA  8. History of systolic CHF 9. Morbid obesity  Discharge Condition: improved Disposition: home  Diet recommendation: full liquids, advance to bland/low-fat diet as tolerated  Filed Weights   04/16/12 0046  Weight: 113.399 kg (250 lb)    History of present illness:  31 year old woman presented to ED with complaint of sharp abd pain, states seen in Urgent Care, treated for UTI, cont pain with eating. Diagnosed with pancreatitis.  [name] Course:  Kathryn [name] was admitted and treated for acute pancreatitis with bowel rest and supportive care. Her condition slowly improved and she is tolerating a diet without pain. Pancreatitis first episode without evident complication. See further discussion below. Abdominal ultrasound incidentally noted renal cysts and bilaterally hydronephrosis, asymptomatic (see below). Recently started on abx as an outpatient for UTI culture unrevealing, plan as below.  1. Acute pancreatitis--symptomatically resolved, lipase continues to trend downwards. Tolerating full liquids. Etiology unclear--denies significant alcohol. Lipid panel unremarkable. Abdominal ultrasound of gallbladder unremarkable, CBD not dilated, LFTs normal, asymptomatic. Drugs--stopped pravachol  as most likely agent by UTD. Other agents lisinopril/HCTZ, nitrofurantoin, ranitidine possible, but less likely. Counseled on bland, low-fat diet.  2. Hypokalemia--repleted.  3. 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 Has been seen by urology here in past. Suspect these findings are chronic. Patient asymptomatic, follow-up as outpatient. Cysts right kidney, may represent chronic advanced hydronephrosis or polycystic change. Mild hydronephrosis of the relatively normal-appearing left kidney. No etiology for the hydronephrosis is evident. Follow-up with urology as an outpatient. The above was discussed with patient.  4. Hematuria--likely from partially treated UTI. [name] final culture from prior to admission had mixed bacteria, none predominant. Suspect partially treated UTI. Empiric amoxicillin, follow-up with PCP next week.  5. HTN--stable.  6. DM type 2, diet controlled--stable, continue SSI, Hgb A1c 5.4.  7. OSA--does not use CPAP.  8. History of systolic CHF--LVEF 25-30% by echo 06/2011, asymptomatic. Continue Coreg, lisinopril. Not on diuretics. Follow-up with PCP as outpatient.  9. Morbid obesity--history of gastric bypass.  Consultants:  None  Procedures:   None  Discharge Instructions  Discharge Orders    Future Orders Please Complete By Expires   Discharge instructions      Comments:   Continue full liquid diet, advance to bland, low-fat diet. Continue this until seen by PCP. Finish antibiotics for suspected UTI. Call physician or seek immediate medical attention for increased abdominal pain, nausea, vomiting, fever or worsening of condition.   Activity as tolerated - No restrictions          Medication List     As of 04/18/2012  4:21 PM    STOP taking these medications         nitrofurantoin (macrocrystal-monohydrate) 100 MG capsule   Commonly known as: MACROBID      pravastatin 10 MG tablet  Commonly known as:  PRAVACHOL      TAKE these medications         amoxicillin 500 MG capsule   Commonly known as: AMOXIL   Take 1 capsule (500 mg total) by mouth 3 (three) times daily.      carvedilol 12.5 MG tablet   Commonly known as: COREG   Take 12.5 mg by mouth 2 (two) times daily with a meal.      cholecalciferol 1000 UNITS tablet   Commonly known as: VITAMIN D   Take 5,000 Units by mouth daily.      lisinopril-hydrochlorothiazide 20-12.5 MG per tablet   Commonly known as: PRINZIDE,ZESTORETIC   Take 1 tablet by mouth 2 (two) times daily.      multivitamin-iron-minerals-folic acid chewable tablet   Chew 1 tablet by mouth daily.      norgestimate-ethinyl estradiol 0.25-35 MG-MCG tablet   Commonly known as: ORTHO-CYCLEN,SPRINTEC,PREVIFEM   Take 1 tablet by mouth daily.      ranitidine 150 MG tablet   Commonly known as: ZANTAC   Take 150 mg by mouth 2 (two) times daily.        The results of significant diagnostics from this hospitalization (including imaging, microbiology, ancillary and laboratory) are listed below for reference.    Significant Diagnostic Studies: US Abdomen Complete  04/15/2012  *RADIOLOGY REPORT*  Clinical Data: Abdominal pain.  History of gastric bypass.  ABDOMEN ULTRASOUND  Technique:  Complete abdominal ultrasound examination was performed including evaluation of the liver, gallbladder, bile ducts, pancreas, kidneys, spleen, IVC, and abdominal aorta.  Comparison: No comparison studies available.  Findings:  Gallbladder:  Minimal layering sludge in the lumen.  No gallstones are evident.  No gallbladder wall thickening or pericholecystic fluid.  The sonographer reports no sonographic Murphy's sign.  Common Bile Duct:  Nondilated at 5 mm diameter.  Liver:  No focal intrahepatic parenchymal abnormality.  No intrahepatic biliary duct dilatation.  IVC:  Normal.  Pancreas:  Obscured by overlying bowel gas.  Spleen:  Normal.  Right kidney:  15 cm in long axis.  No definite normal  right renal parenchyma can be identified.  There is a large reniform collection of cysts in the expected location of the right kidney.  This may be related to advanced chronic hydronephrosis or multicystic disease of the right kidney.  Left kidney:  15.2 cm in long axis.  There is some fullness of the intrarenal collecting system and renal pelvis is prominent. Imaging features suggest mild hydronephrosis.  Abdominal Aorta:  No aneurysm.  IMPRESSION: Gallbladder sludge without gallbladder wall thickening or evidence of gallstones.New  Collection of cysts identified in the expected location of the right kidney.  This may represent chronic advanced hydronephrosis or polycystic change.  Mild hydronephrosis of the relatively normal-appearing left kidney. No etiology for the hydronephrosis is evident.   Original Report Authenticated By: ERIC A. MANSELL, M.D.    Labs: Basic Metabolic Panel:  Lab 04/17/12 1610 04/16/12 0450 04/15/12 2100  NA 135 136 136  K 3.9 3.4* 3.8  CL 102 102 100  CO2 25 27 24   GLUCOSE 72 76 77  BUN 6 7 8   CREATININE 0.74 0.80 0.80  CALCIUM 8.8 8.5 9.0  MG -- -- --  PHOS -- -- --   Liver Function Tests:  Lab 04/17/12 0445 04/16/12 0450 04/15/12 2100  AST 11 12 20   ALT 8 8 12   ALKPHOS 45 45 52  BILITOT 0.3 0.2* 0.3  PROT 6.0 5.7* 6.6  ALBUMIN 2.8* 2.7* 3.3*    Lab 04/18/12 0441 04/17/12 0445 04/16/12 0450 04/15/12 2100  LIPASE 217* 228* 448* 719*  AMYLASE -- -- -- --   CBC:  Lab 04/16/12 0450 04/15/12 2100  WBC 5.3 7.4  NEUTROABS -- --  HGB 11.0* 12.1  HCT 32.6* 35.9*  MCV 89.8 89.5  PLT 230 231   CBG:  Lab 04/18/12 0857 04/18/12 0039 04/17/12 2213 04/17/12 1631 04/17/12 1234  GLUCAP 71 74 70 81 132*    Principal Problem:  *Pancreatitis, acute Active Problems:  Morbid obesity  DM type 2 (diabetes mellitus, type 2)  Chronic systolic CHF (congestive heart failure)  Abnormal ultrasound of kidney   Time coordinating discharge: 35  minutes  Signed:  Brendia Sacks, MD [facility] 04/18/2012, 4:21 PM

## 2012-04-18 NOTE — Progress Notes (Signed)
[facility] PROGRESS NOTE  Sheray Grist RUE:[id number] DOB: 1981/06/12 DOA: 04/15/2012 PCP: Thayer Headings, MD  Assessment/Plan: 1. Acute pancreatitis--symptomatically resolved, lipase continues to trend downwards. Tolerating full liquids. Etiology unclear--denies significant alcohol. Lipid panel unremarkable. Abdominal ultrasound of gallbladder unremarkable, CBD not dilated, LFTs normal. Drugs--stopped pravachol as most likely agent by UTD. Other agents lisinopril/HCTZ, nitrofurantoin, ranitidine possible, but less likely. Counseled on bland, low-fat diet. 2. Hypokalemia--repleted. 3. 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 Has been seen by urology here in past. Suspect these findings are chronic. Patient asymptomatic, follow-up as outpatient. Cysts right kidney, may represent chronic advanced hydronephrosis or polycystic change. Mild hydronephrosis of the relatively normal-appearing left kidney. No etiology for the hydronephrosis is evident. Follow-up with urology as an outpatient. The above was discussed with patient. 4. Hematuria--likely from partially treated UTI. Per [facility] final culture from prior to admission had mixed bacteria, none predominant. Suspect partially treated UTI. Empiric amoxicillin, follow-up with PCP next week. 5. HTN--stable. 6. DM type 2, diet controlled--stable, continue SSI, Hgb A1c 5.4. 7. OSA--does not use CPAP. 8. History of systolic CHF--LVEF 25-30% by echo 06/2011, asymptomatic. Continue Coreg, lisinopril. Not on diuretics. Follow-up with PCP as outpatient. 9. Morbid obesity--history of gastric bypass.  Code Status: Full code Family Communication: none present Disposition Plan: home today  Brendia Sacks, MD  [facility] Team 6 Pager [phone]. If 8PM-8AM, please contact night-coverage at [email or web address], password [facility] 04/18/2012, 4:07 PM  LOS: 3 days   Brief narrative: 31 year old woman presented to  ED with complaint of sharp abd pain, states seen in Urgent Care, treated for UTI, cont pain with eating. Diagnosed with pancreatitis.  Consultants:    Procedures:    HPI/Subjective: Feels better, tolerating full liquids, no pain, wants to go home.  Objective: Filed Vitals:   04/17/12 1725 04/17/12 2200 04/18/12 0600 04/18/12 1403  BP: 157/97 138/95 137/83 144/94  Pulse: 86 85 77 80  Temp:  98 F (36.7 C) 98.3 F (36.8 C) 98.3 F (36.8 C)  TempSrc:  Oral Oral Oral  Resp:  20 18 20   Height:      Weight:      SpO2: 100% 100% 98% 99%    Intake/Output Summary (Last 24 hours) at 04/18/12 1607 Last data filed at 04/17/12 2200  Gross per 24 hour  Intake    240 ml  Output      0 ml  Net    240 ml   Filed Weights   04/16/12 0046  Weight: 113.399 kg (250 lb)    Exam:  General:  Appears calm and comfortable, well-appearing. Cardiovascular: RRR, no m/r/g. No LE edema.  Respiratory: CTA bilaterally, no w/r/r. Normal respiratory effort. Abdomen: soft, ntnd  Exam current 10/4.  Data Reviewed: Basic Metabolic Panel:  Lab 04/17/12 6213 04/16/12 0450 04/15/12 2100  NA 135 136 136  K 3.9 3.4* 3.8  CL 102 102 100  CO2 25 27 24   GLUCOSE 72 76 77  BUN 6 7 8   CREATININE 0.74 0.80 0.80  CALCIUM 8.8 8.5 9.0  MG -- -- --  PHOS -- -- --   Liver Function Tests:  Lab 04/17/12 0445 04/16/12 0450 04/15/12 2100  AST 11 12 20   ALT 8 8 12   ALKPHOS 45 45 52  BILITOT 0.3 0.2* 0.3  PROT 6.0 5.7* 6.6  ALBUMIN 2.8* 2.7* 3.3*    Lab 04/18/12 0441 04/17/12 0445 04/16/12 0450 04/15/12 2100  LIPASE 217* 228* 448* 719*  AMYLASE -- -- -- --  CBC:  Lab 04/16/12 0450 04/15/12 2100  WBC 5.3 7.4  NEUTROABS -- --  HGB 11.0* 12.1  HCT 32.6* 35.9*  MCV 89.8 89.5  PLT 230 231   CBG:  Lab 04/18/12 0857 04/18/12 0039 04/17/12 2213 04/17/12 1631 04/17/12 1234  GLUCAP 71 74 70 81 132*    Studies: No results found.  Scheduled Meds:    . carvedilol  12.5 mg Oral BID WC  .  enoxaparin (LOVENOX) injection  40 mg Subcutaneous Q24H  . insulin aspart  0-9 Units Subcutaneous TID WC  . lisinopril  20 mg Oral Daily   Continuous Infusions:   Principal Problem:  *Pancreatitis, acute Active Problems:  Morbid obesity  DM type 2 (diabetes mellitus, type 2)  Chronic systolic CHF (congestive heart failure)  Abnormal ultrasound of kidney     Brendia Sacks, MD  [facility] Team 6 Pager [phone]. If 8PM-8AM, please contact night-coverage at [email or web address], password [facility] 04/18/2012, 4:07 PM  LOS: 3 days

## 2012-04-21 LAB — GLUCOSE, CAPILLARY: Glucose-Capillary: 142 mg/dL — ABNORMAL HIGH (ref 70–99)

## 2012-08-17 ENCOUNTER — Emergency Department (HOSPITAL_COMMUNITY): Payer: BC Managed Care – PPO

## 2012-08-17 ENCOUNTER — Emergency Department (HOSPITAL_COMMUNITY)
Admission: EM | Admit: 2012-08-17 | Discharge: 2012-08-18 | Disposition: A | Discharge status: Home / Self Care | Payer: BC Managed Care – PPO | Attending: Emergency Medicine | Admitting: Emergency Medicine

## 2012-08-17 ENCOUNTER — Encounter (HOSPITAL_COMMUNITY): Payer: Self-pay | Admitting: *Deleted

## 2012-08-17 DIAGNOSIS — Z87891 Personal history of nicotine dependence: Secondary | ICD-10-CM | POA: Insufficient documentation

## 2012-08-17 DIAGNOSIS — Z862 Personal history of diseases of the blood and blood-forming organs and certain disorders involving the immune mechanism: Secondary | ICD-10-CM | POA: Insufficient documentation

## 2012-08-17 DIAGNOSIS — Z8679 Personal history of other diseases of the circulatory system: Secondary | ICD-10-CM | POA: Insufficient documentation

## 2012-08-17 DIAGNOSIS — R109 Unspecified abdominal pain: Secondary | ICD-10-CM | POA: Insufficient documentation

## 2012-08-17 DIAGNOSIS — E785 Hyperlipidemia, unspecified: Secondary | ICD-10-CM | POA: Insufficient documentation

## 2012-08-17 DIAGNOSIS — Z79899 Other long term (current) drug therapy: Secondary | ICD-10-CM | POA: Insufficient documentation

## 2012-08-17 DIAGNOSIS — Z8639 Personal history of other endocrine, nutritional and metabolic disease: Secondary | ICD-10-CM | POA: Insufficient documentation

## 2012-08-17 DIAGNOSIS — Z87412 Personal history of vulvar dysplasia: Secondary | ICD-10-CM | POA: Insufficient documentation

## 2012-08-17 DIAGNOSIS — Z3202 Encounter for pregnancy test, result negative: Secondary | ICD-10-CM | POA: Insufficient documentation

## 2012-08-17 DIAGNOSIS — E119 Type 2 diabetes mellitus without complications: Secondary | ICD-10-CM | POA: Insufficient documentation

## 2012-08-17 DIAGNOSIS — F17200 Nicotine dependence, unspecified, uncomplicated: Secondary | ICD-10-CM | POA: Insufficient documentation

## 2012-08-17 DIAGNOSIS — R197 Diarrhea, unspecified: Secondary | ICD-10-CM | POA: Insufficient documentation

## 2012-08-17 DIAGNOSIS — Z9884 Bariatric surgery status: Secondary | ICD-10-CM | POA: Insufficient documentation

## 2012-08-17 DIAGNOSIS — R112 Nausea with vomiting, unspecified: Secondary | ICD-10-CM | POA: Insufficient documentation

## 2012-08-17 DIAGNOSIS — I10 Essential (primary) hypertension: Secondary | ICD-10-CM | POA: Insufficient documentation

## 2012-08-17 DIAGNOSIS — Z87440 Personal history of urinary (tract) infections: Secondary | ICD-10-CM | POA: Insufficient documentation

## 2012-08-17 DIAGNOSIS — Z8709 Personal history of other diseases of the respiratory system: Secondary | ICD-10-CM | POA: Insufficient documentation

## 2012-08-17 LAB — POCT PREGNANCY, URINE: Preg Test, Ur: NEGATIVE

## 2012-08-17 MED ORDER — SODIUM CHLORIDE 0.9 % IV BOLUS (SEPSIS): 1000.0000 mL | Freq: Once | INTRAVENOUS | Status: DC

## 2012-08-17 MED ORDER — SODIUM CHLORIDE 0.9 % IV SOLN
INTRAVENOUS | Status: DC
  Administered 2012-08-17: via INTRAVENOUS

## 2012-08-17 MED ORDER — ONDANSETRON HCL 4 MG/2ML IJ SOLN
4.0000 mg | Freq: Once | INTRAMUSCULAR | Status: AC
  Administered 2012-08-17: 4 mg via INTRAVENOUS
  Filled 2012-08-17: qty 2

## 2012-08-17 MED ORDER — SODIUM CHLORIDE 0.9 % IV BOLUS (SEPSIS)
500.0000 mL | Freq: Once | INTRAVENOUS | Status: AC
  Administered 2012-08-17: 500 mL via INTRAVENOUS

## 2012-08-17 NOTE — ED Notes (Signed)
PT c/o diarrhea and vomiting x 30 mins after drinking milk tonight. Pt has hx of abdominal cramps after drinking milk but has never been dx'd w/ lactose intolerance. Pt has hx of pancreatitis in past. Pt states pain in suprapubic area. Pt recently treated for UTI and has finished Keflex that was rx'd to her for this. Pt vomiting in triage.

## 2012-08-18 ENCOUNTER — Emergency Department (HOSPITAL_COMMUNITY): Payer: BC Managed Care – PPO

## 2012-08-18 LAB — COMPREHENSIVE METABOLIC PANEL
BUN: 17 mg/dL (ref 6–23)
CO2: 22 mEq/L (ref 19–32)
Chloride: 99 mEq/L (ref 96–112)
Creatinine, Ser: 0.73 mg/dL (ref 0.50–1.10)
GFR calc Af Amer: >90 mL/min (ref >90)
GFR calc non Af Amer: >90 mL/min (ref >90)
Total Bilirubin: 0.6 mg/dL (ref 0.3–1.2)

## 2012-08-18 LAB — CBC WITH DIFFERENTIAL/PLATELET
Eosinophils Relative: 0 % (ref 0–5)
HCT: 45.6 % (ref 36.0–46.0)
Hemoglobin: 15.8 g/dL — ABNORMAL HIGH (ref 12.0–15.0)
Lymphocytes Relative: 3 % — ABNORMAL LOW (ref 12–46)
MCHC: 34.6 g/dL (ref 30.0–36.0)
MCV: 88.2 fL (ref 78.0–100.0)
Monocytes Absolute: 0.4 10*3/uL (ref 0.1–1.0)
Monocytes Relative: 4 % (ref 3–12)
Neutro Abs: 11.1 10*3/uL — ABNORMAL HIGH (ref 1.7–7.7)
WBC: 11.9 10*3/uL — ABNORMAL HIGH (ref 4.0–10.5)

## 2012-08-18 LAB — URINALYSIS, MICROSCOPIC ONLY
Glucose, UA: NEGATIVE mg/dL
Ketones, ur: NEGATIVE mg/dL
Leukocytes, UA: NEGATIVE
Nitrite: NEGATIVE
Protein, ur: NEGATIVE mg/dL
Urobilinogen, UA: 0.2 mg/dL (ref 0.0–1.0)

## 2012-08-18 LAB — LIPASE, BLOOD: Lipase: 41 U/L (ref 11–59)

## 2012-08-18 MED ORDER — ONDANSETRON HCL 4 MG PO TABS: 4.0000 mg | ORAL_TABLET | Freq: Three times a day (TID) | ORAL | Status: DC | PRN

## 2012-08-18 MED ORDER — IOHEXOL 300 MG/ML  SOLN
25.0000 mL | INTRAMUSCULAR | Status: AC
  Administered 2012-08-18 (×2): 25 mL via ORAL

## 2012-08-18 MED ORDER — KETOROLAC TROMETHAMINE 30 MG/ML IJ SOLN
30.0000 mg | Freq: Once | INTRAMUSCULAR | Status: AC
  Administered 2012-08-18: 30 mg via INTRAVENOUS
  Filled 2012-08-18: qty 1

## 2012-08-18 MED ORDER — ONDANSETRON HCL 4 MG/2ML IJ SOLN
4.0000 mg | Freq: Once | INTRAMUSCULAR | Status: AC
  Administered 2012-08-18: 4 mg via INTRAVENOUS
  Filled 2012-08-18: qty 2

## 2012-08-18 MED ORDER — IOHEXOL 300 MG/ML  SOLN
100.0000 mL | Freq: Once | INTRAMUSCULAR | Status: AC | PRN
  Administered 2012-08-18: 100 mL via INTRAVENOUS

## 2012-08-18 MED ORDER — FAMOTIDINE IN NACL 20-0.9 MG/50ML-% IV SOLN
20.0000 mg | Freq: Once | INTRAVENOUS | Status: AC
  Administered 2012-08-18: 20 mg via INTRAVENOUS
  Filled 2012-08-18: qty 50

## 2012-08-18 NOTE — ED Notes (Signed)
Pt able to tolerate fluids with no n/v.

## 2012-08-18 NOTE — ED Notes (Signed)
Bedside report received from previous RN 

## 2012-08-18 NOTE — ED Provider Notes (Signed)
History     CSN: [id number]  Arrival date & time 08/17/12  2240   First MD Initiated Contact with Patient 08/17/12 2308      Chief Complaint  Patient presents with  . Diarrhea  . Emesis     HPI Pt was seen at 2330.  Per pt, c/o gradual onset and persistence of multiple intermittent episodes of N/V/D that began tonight PTA.  States her symptoms began after she drank milk.  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  Describes the stools as "watery."  Has been associated with generalized abd "cramping" and "soreness."  Denies abd pain, no CP/SOB, no back pain, no fevers, no black or blood in stools or emesis.     Past Medical History  Diagnosis Date  . Hypertension   . Hyperlipidemia   . Vitamin D deficiency   . CHF (congestive heart failure)     ECHO cardiogram schedule 07/11/11  . Sleep apnea     does not use CPAP  . Diabetes mellitus     no med - last A1C was 5.9  on 04/2011  . Anemia     history only - no current problem  . UTI (lower urinary tract infection)     Past Surgical History  Procedure Date  . Upper gastrointestinal endoscopy   . Gastric bypass 01/15/2011  . Vulvar lesion removal 07/13/2011    Procedure: VULVAR LESION;  Surgeon: Serita Kyle, MD;  Location: [facility];  Service: Gynecology;  Laterality: Right;  Excision of right vulvar mass.    History  Substance Use Topics  . Smoking status: Former Smoker -- 0.2 packs/day for 1 years    Types: Cigarettes    Quit date: 12/14/2000  . Smokeless tobacco: Never Used  . Alcohol Use: Yes     Comment: Rarely - twice a year    Review of Systems ROS: Statement: All systems negative except as marked or noted in the HPI; Constitutional: Negative for fever and chills. ; ; Eyes: Negative for eye pain, redness and discharge. ; ; ENMT: Negative for ear pain, hoarseness, nasal congestion, sinus pressure and sore throat. ; ;  Cardiovascular: Negative for chest pain, palpitations, diaphoresis, dyspnea and peripheral edema. ; ; Respiratory: Negative for cough, wheezing and stridor. ; ; Gastrointestinal: +N/V/D, abd cramping. Negative for blood in stool, hematemesis, jaundice and rectal bleeding. . ; ; Genitourinary: Negative for dysuria, flank pain and hematuria. ; ; Musculoskeletal: Negative for back pain and neck pain. Negative for swelling and trauma.; ; Skin: Negative for pruritus, rash, abrasions, blisters, bruising and skin lesion.; ; Neuro: Negative for headache, lightheadedness and neck stiffness. Negative for weakness, altered level of consciousness , altered mental status, extremity weakness, paresthesias, involuntary movement, seizure and syncope.       Allergies  Floxin and Sulfa antibiotics  Home Medications   Current Outpatient Rx  Name  Route  Sig  Dispense  Refill  . CARVEDILOL 12.5 MG PO TABS   Oral   Take 12.5 mg by mouth 2 (two) times daily with a meal. Pt only taking once a day even though prescribed for two.         Marland Kitchen VITAMIN D 1000 UNITS PO TABS   Oral   Take 5,000 Units by mouth daily.          Marland Kitchen LISINOPRIL-HYDROCHLOROTHIAZIDE 20-12.5 MG PO TABS   Oral  Take 1 tablet by mouth 2 (two) times daily.           Marland Kitchen NORGESTIMATE-ETH ESTRADIOL 0.25-35 MG-MCG PO TABS   Oral   Take 1 tablet by mouth daily.         Marland Kitchen RANITIDINE HCL 150 MG PO TABS   Oral   Take 150 mg by mouth 2 (two) times daily.            BP 131/101  Pulse 109  Temp 98.4 F (36.9 C) (Oral)  Resp 20  Wt 255 lb (115.667 kg)  SpO2 95%  LMP 08/04/2012  Physical Exam 2335: Physical examination:  Nursing notes reviewed; Vital signs and O2 SAT reviewed;  Constitutional: Well developed, Well nourished, Well hydrated, In no acute distress; Head:  Normocephalic, atraumatic; Eyes: EOMI, PERRL, No scleral icterus; ENMT: Mouth and pharynx normal, Mucous membranes moist; Neck: Supple, Full range of motion, No  lymphadenopathy; Cardiovascular: Regular rate and rhythm, No murmur, rub, or gallop; Respiratory: Breath sounds clear & equal bilaterally, No rales, rhonchi, wheezes.  Speaking full sentences with ease, Normal respiratory effort/excursion; Chest: Nontender, Movement normal; Abdomen: Soft, Nontender, Nondistended, Normal bowel sounds; Genitourinary: No CVA tenderness; Extremities: Pulses normal, No tenderness, No edema, No calf edema or asymmetry.; Neuro: AA&Ox3, Major CN grossly intact.  Speech clear. No gross focal motor or sensory deficits in extremities.; Skin: Color normal, Warm, Dry.   ED Course  Procedures     MDM  MDM Reviewed: nursing note, vitals and previous chart Interpretation: labs and x-ray     Results for orders placed during the hospital encounter of 08/17/12  CBC WITH DIFFERENTIAL      Component Value Range   WBC 11.9 (*) 4.0 - 10.5 K/uL   RBC 5.17 (*) 3.87 - 5.11 MIL/uL   Hemoglobin 15.8 (*) 12.0 - 15.0 g/dL   HCT 16.1  09.6 - 04.5 %   MCV 88.2  78.0 - 100.0 fL   MCH 30.6  26.0 - 34.0 pg   MCHC 34.6  30.0 - 36.0 g/dL   RDW 40.9  81.1 - 91.4 %   Platelets 276  150 - 400 K/uL   Neutrophils Relative 93 (*) 43 - 77 %   Neutro Abs 11.1 (*) 1.7 - 7.7 K/uL   Lymphocytes Relative 3 (*) 12 - 46 %   Lymphs Abs 0.4 (*) 0.7 - 4.0 K/uL   Monocytes Relative 4  3 - 12 %   Monocytes Absolute 0.4  0.1 - 1.0 K/uL   Eosinophils Relative 0  0 - 5 %   Eosinophils Absolute 0.0  0.0 - 0.7 K/uL   Basophils Relative 0  0 - 1 %   Basophils Absolute 0.0  0.0 - 0.1 K/uL  COMPREHENSIVE METABOLIC PANEL      Component Value Range   Sodium 135  135 - 145 mEq/L   Potassium 3.8  3.5 - 5.1 mEq/L   Chloride 99  96 - 112 mEq/L   CO2 22  19 - 32 mEq/L   Glucose, Bld 129 (*) 70 - 99 mg/dL   BUN 17  6 - 23 mg/dL   Creatinine, Ser 7.82  0.50 - 1.10 mg/dL   Calcium 9.1  8.4 - 95.6 mg/dL   Total Protein 7.7  6.0 - 8.3 g/dL   Albumin 3.7  3.5 - 5.2 g/dL   AST 17  0 - 37 U/L   ALT 12  0 - 35  U/L   Alkaline Phosphatase 53  39 - 117 U/L   Total Bilirubin 0.6  0.3 - 1.2 mg/dL   GFR calc non Af Amer >90  >90 mL/min   GFR calc Af Amer >90  >90 mL/min  LIPASE, BLOOD      Component Value Range   Lipase 41  11 - 59 U/L  URINALYSIS, MICROSCOPIC ONLY      Component Value Range   Color, Urine YELLOW  YELLOW   APPearance CLEAR  CLEAR   Specific Gravity, Urine 1.022  1.005 - 1.030   pH 6.5  5.0 - 8.0   Glucose, UA NEGATIVE  NEGATIVE mg/dL   Hgb urine dipstick NEGATIVE  NEGATIVE   Bilirubin Urine NEGATIVE  NEGATIVE   Ketones, ur NEGATIVE  NEGATIVE mg/dL   Protein, ur NEGATIVE  NEGATIVE mg/dL   Urobilinogen, UA 0.2  0.0 - 1.0 mg/dL   Nitrite NEGATIVE  NEGATIVE   Leukocytes, UA NEGATIVE  NEGATIVE   Bacteria, UA RARE  RARE   Squamous Epithelial / LPF RARE  RARE  GLUCOSE, CAPILLARY      Component Value Range   Glucose-Capillary 137 (*) 70 - 99 mg/dL  POCT PREGNANCY, URINE      Component Value Range   Preg Test, Ur NEGATIVE  NEGATIVE   Ct Abdomen Pelvis W Contrast 08/18/2012  *RADIOLOGY REPORT*  Clinical Data: Suprapubic pain.  Nausea, vomiting, diarrhea.  White cell count 11.9.  Recent medication for urinary tract infection.  CT ABDOMEN AND PELVIS WITH CONTRAST  Technique:  Multidetector CT imaging of the abdomen and pelvis was performed following the standard protocol during bolus administration of intravenous contrast.  Contrast: OMNIPAQUE IOHEXOL 300 MG/ML  SOLN  Comparison: None.  Findings: The lung bases are clear.  Small esophageal hiatal hernia.  The liver, spleen, gallbladder, pancreas, adrenal glands, abdominal aorta, and retroperitoneal lymph nodes are unremarkable.  There is hydronephrosis of the right kidney without obstructing lesion. There is parenchymal atrophy.  Changes were present on prior ultrasound from 04/15/2012.  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  The left kidney is unremarkable.  The stomach, small bowel, and colon are not  abnormally distended.  Flow is demonstrated in the portal and mesenteric vessels.  No free air or free fluid in the abdomen.  Postoperative changes in the stomach with surgical clips present and apparent jejunal loop consistent with bypass procedure.  Contrast material is also present in the excluded stomach suggesting possible dehiscence.  Pelvis:  The uterus and ovaries are not enlarged.  Bladder wall is not thickened.  The appendix is not identified.  The no inflammatory changes in the sigmoid colon or in the right lower quadrant.  No free or loculated pelvic fluid collections.  No significant pelvic lymphadenopathy.  Normal alignment of the lumbar vertebrae.  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 Postoperative changes of gastric bypass with contrast material seen in the excluded stomach suggesting possibility of dehiscence.  No acute process identified to explain the history of pain.   Original Report Authenticated By: Burman Nieves, M.D.    Dg Abd Acute W/chest 08/18/2012  *RADIOLOGY REPORT*  Clinical Data: Abdominal pain, nausea, and vomiting.  ACUTE ABDOMEN SERIES (ABDOMEN 2 VIEW & CHEST 1 VIEW)  Comparison: Chest 11/01/2008.  Abdomen 04/14/2012  Findings: Shallow inspiration.  Normal heart size and pulmonary vascularity.  No focal airspace consolidation in the lungs.  No blunting of costophrenic angles.  No pneumothorax.  Mediastinal contours appear intact.  No significant changes since the previous study.  Scattered gas and stool in the colon.  No small or large bowel distension.  No free intra-abdominal air.  No abnormal air fluid levels.  Postoperative changes in the left upper quadrant.  No radiopaque stones identified.  Phleboliths in the pelvis. Visualized bones appear intact.  IMPRESSION: No evidence of active pulmonary disease.  Nonobstructive bowel gas pattern.   Original Report Authenticated By: Burman Nieves, M.D.        0600:  Pt has tol PO well while in the ED without N/V.  No stooling while in the ED.  Abd remains benign, VSS.  No acute findings on CT scan to account for N/V.  No SBO.  No perforation.  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  Pt states she feels better and wants to go home now.  T/C to General Surgeon Dr. Biagio Quint, case discussed, including:  HPI, pertinent PM/SHx, VS/PE, dx testing, ED course and treatment:  States gastrogastric communication can occur s/p gastric bypass, as long as there is no perforation and no bowel or stomach distention or obstruction, there is no emergent need for intervention, pt can f/u with her Bariatric Surgeon as outpt.  Dx and testing, as well as d/w General Surgeon, d/w pt.  Questions answered.  Verb understanding.  Pt already aware of Uro findings above (from her admit 04/2012).  Pt made aware of GI findings above and conversation with General Surgeon.  Pt agreeable to d/c home with outpt f/u with Uro MD and her Bariatric Surgery MD.            Laray Anger, DO 08/19/12 2353

## 2012-08-30 ENCOUNTER — Other Ambulatory Visit: Payer: Self-pay

## 2013-05-21 ENCOUNTER — Other Ambulatory Visit: Payer: Self-pay

## 2013-08-09 ENCOUNTER — Encounter (HOSPITAL_BASED_OUTPATIENT_CLINIC_OR_DEPARTMENT_OTHER): Payer: Self-pay | Admitting: Emergency Medicine

## 2013-08-09 ENCOUNTER — Emergency Department (HOSPITAL_BASED_OUTPATIENT_CLINIC_OR_DEPARTMENT_OTHER)
Admission: EM | Admit: 2013-08-09 | Discharge: 2013-08-09 | Disposition: A | Discharge status: Home / Self Care | Payer: BC Managed Care – PPO | Attending: Emergency Medicine | Admitting: Emergency Medicine

## 2013-08-09 ENCOUNTER — Ambulatory Visit (HOSPITAL_COMMUNITY)
Admission: RE | Admit: 2013-08-09 | Discharge: 2013-08-09 | Disposition: A | Discharge status: Home / Self Care | Payer: BC Managed Care – PPO | Source: Ambulatory Visit | Attending: Emergency Medicine | Admitting: Emergency Medicine

## 2013-08-09 DIAGNOSIS — M7989 Other specified soft tissue disorders: Secondary | ICD-10-CM

## 2013-08-09 DIAGNOSIS — M79609 Pain in unspecified limb: Secondary | ICD-10-CM | POA: Insufficient documentation

## 2013-08-09 DIAGNOSIS — I10 Essential (primary) hypertension: Secondary | ICD-10-CM | POA: Insufficient documentation

## 2013-08-09 DIAGNOSIS — M79606 Pain in leg, unspecified: Secondary | ICD-10-CM

## 2013-08-09 DIAGNOSIS — R7989 Other specified abnormal findings of blood chemistry: Secondary | ICD-10-CM

## 2013-08-09 DIAGNOSIS — E559 Vitamin D deficiency, unspecified: Secondary | ICD-10-CM | POA: Insufficient documentation

## 2013-08-09 DIAGNOSIS — Z87891 Personal history of nicotine dependence: Secondary | ICD-10-CM | POA: Insufficient documentation

## 2013-08-09 DIAGNOSIS — I509 Heart failure, unspecified: Secondary | ICD-10-CM | POA: Insufficient documentation

## 2013-08-09 DIAGNOSIS — Z79899 Other long term (current) drug therapy: Secondary | ICD-10-CM | POA: Insufficient documentation

## 2013-08-09 DIAGNOSIS — Z87440 Personal history of urinary (tract) infections: Secondary | ICD-10-CM | POA: Insufficient documentation

## 2013-08-09 DIAGNOSIS — R799 Abnormal finding of blood chemistry, unspecified: Secondary | ICD-10-CM | POA: Insufficient documentation

## 2013-08-09 DIAGNOSIS — E119 Type 2 diabetes mellitus without complications: Secondary | ICD-10-CM | POA: Insufficient documentation

## 2013-08-09 DIAGNOSIS — Z8669 Personal history of other diseases of the nervous system and sense organs: Secondary | ICD-10-CM | POA: Insufficient documentation

## 2013-08-09 DIAGNOSIS — Z862 Personal history of diseases of the blood and blood-forming organs and certain disorders involving the immune mechanism: Secondary | ICD-10-CM | POA: Insufficient documentation

## 2013-08-09 DIAGNOSIS — Z9884 Bariatric surgery status: Secondary | ICD-10-CM | POA: Insufficient documentation

## 2013-08-09 DIAGNOSIS — M25569 Pain in unspecified knee: Secondary | ICD-10-CM

## 2013-08-09 DIAGNOSIS — E785 Hyperlipidemia, unspecified: Secondary | ICD-10-CM | POA: Insufficient documentation

## 2013-08-09 LAB — D-DIMER, QUANTITATIVE (NOT AT ARMC): D DIMER QUANT: 3.62 ug{FEU}/mL — AB (ref 0.00–0.48)

## 2013-08-09 MED ORDER — ENOXAPARIN SODIUM 150 MG/ML ~~LOC~~ SOLN
1.0000 mg/kg | Freq: Once | SUBCUTANEOUS | Status: AC
  Administered 2013-08-09: 130 mg via SUBCUTANEOUS
  Filled 2013-08-09: qty 1

## 2013-08-09 NOTE — ED Notes (Signed)
C/o left lower leg pain that started four days ago. C/o warm to left lower leg. Redness noted to left lower leg. Denies any cp/sob. Denies any recent long car rides.

## 2013-08-09 NOTE — ED Provider Notes (Signed)
CSN: [id number]     Arrival date & time 08/09/13  0156 History   None    Chief Complaint  Patient presents with  . Leg Pain   (Consider location/radiation/quality/duration/timing/severity/associated sxs/prior Treatment) HPI This is a 33 year old female status post bariatric surgery for morbid obesity. She is here with 3-4 days of pain and swelling in her left lower extremity. There is focal swelling, tenderness and mild erythema on the medial aspect of her distal left lower leg. She has also had some pain in her left anteromedial thigh. She rates her pain as about 3/10. Pain is improved with Tylenol. She denies chest pain or shortness of breath. She thinks she may have bumped her leg in the middle of the night as the triggering event.  Past Medical History  Diagnosis Date  . Hypertension   . Hyperlipidemia   . Vitamin D deficiency   . CHF (congestive heart failure)     ECHO cardiogram schedule 07/11/11  . Sleep apnea     does not use CPAP  . Diabetes mellitus     no med - last A1C was 5.9  on 04/2011  . Anemia     history only - no current problem  . UTI (lower urinary tract infection)    Past Surgical History  Procedure Laterality Date  . Upper gastrointestinal endoscopy    . Gastric bypass  01/15/2011  . Vulvar lesion removal  07/13/2011    Procedure: VULVAR LESION;  Surgeon: Serita KyleSheronette A Cousins, MD;  Location: [facility];  Service: Gynecology;  Laterality: Right;  Excision of right vulvar mass.   No family history on file. History  Substance Use Topics  . Smoking status: Former Smoker -- 0.25 packs/day for 1 years    Types: Cigarettes    Quit date: 12/14/2000  . Smokeless tobacco: Never Used  . Alcohol Use: Yes     Comment: Rarely - twice a year   OB History   Grav Para Term Preterm Abortions TAB SAB Ect Mult Living                 Review of Systems  All other systems reviewed and are negative.    Allergies  Floxin and Sulfa antibiotics  Home Medications    Current Outpatient Rx  Name  Route  Sig  Dispense  Refill  . levonorgestrel-ethinyl estradiol (AVIANE,ALESSE,LESSINA) 0.1-20 MG-MCG tablet   Oral   Take 1 tablet by mouth daily.         . carvedilol (COREG) 12.5 MG tablet   Oral   Take 12.5 mg by mouth 2 (two) times daily with a meal. Pt only taking once a day even though prescribed for two.         . cholecalciferol (VITAMIN D) 1000 UNITS tablet   Oral   Take 5,000 Units by mouth daily.          Marland Kitchen. lisinopril-hydrochlorothiazide (PRINZIDE,ZESTORETIC) 20-12.5 MG per tablet   Oral   Take 1 tablet by mouth 2 (two) times daily.           . norgestimate-ethinyl estradiol (ORTHO-CYCLEN,SPRINTEC,PREVIFEM) 0.25-35 MG-MCG tablet   Oral   Take 1 tablet by mouth daily.         . ondansetron (ZOFRAN) 4 MG tablet   Oral   Take 1 tablet (4 mg total) by mouth every 8 (eight) hours as needed for nausea.   8 tablet   0   . ranitidine (ZANTAC) 150 MG tablet   Oral  Take 150 mg by mouth 2 (two) times daily.           BP 152/93  Pulse 109  Temp(Src) 98.8 F (37.1 C) (Oral)  Resp 20  Ht 5' 6.5" (1.689 m)  Wt 285 lb (129.275 kg)  BMI 45.32 kg/m2  SpO2 98%  LMP 07/20/2013  Physical Exam General: Well-developed, well-nourished female in no acute distress; appearance consistent with age of record HENT: normocephalic; atraumatic Eyes: pupils equal, round and reactive to light; extraocular muscles intact Neck: supple Heart: regular rate and rhythm Lungs: clear to auscultation bilaterally Abdomen: soft; nondistended; nontender; bowel sounds present Extremities: No deformity; full range of motion; pulses normal; trace edema of left lower leg with focal swelling, mild tenderness, mild erythema and mild warmth of the medial left distal lower leg; no left thigh tenderness Neurologic: Awake, alert and oriented; motor function intact in all extremities and symmetric; no facial droop Skin: Warm and dry Psychiatric: Normal mood  and affect    ED Course  Procedures (including critical care time)   MDM   Nursing notes and vitals signs, including pulse oximetry, reviewed.  Summary of this visit's results, reviewed by myself:  Labs:  Results for orders placed during the hospital encounter of 08/09/13 (from the past 24 hour(s))  D-DIMER, QUANTITATIVE     Status: Abnormal   Collection Time    08/09/13  2:24 AM      Result Value Range   D-Dimer, Quant 3.62 (*) 0.00 - 0.48 ug/mL-FEU   We will treat the patient with Lovenox and have her followup with a Doppler ultrasound later today.   Hanley Seamen, MD 08/09/13 [phone]

## 2013-08-09 NOTE — Progress Notes (Signed)
VASCULAR LAB PRELIMINARY  PRELIMINARY  PRELIMINARY  PRELIMINARY  Left lower extremity venous duplex completed.    Preliminary report:  Left:  No evidence of DVT, superficial thrombosis, or Baker's cyst.   Hazelyn Kallen, RVT 08/09/2013, 10:04 AM

## 2013-08-09 NOTE — Discharge Instructions (Signed)
Followup with the [facility] vascular clinic as instructed.

## 2013-08-10 ENCOUNTER — Other Ambulatory Visit (HOSPITAL_COMMUNITY): Payer: Self-pay | Admitting: Emergency Medicine

## 2013-08-10 DIAGNOSIS — M25569 Pain in unspecified knee: Secondary | ICD-10-CM

## 2013-10-16 ENCOUNTER — Telehealth (HOSPITAL_COMMUNITY): Payer: Self-pay | Admitting: *Deleted

## 2013-10-27 ENCOUNTER — Telehealth (HOSPITAL_COMMUNITY): Payer: Self-pay | Admitting: *Deleted

## 2013-10-30 ENCOUNTER — Other Ambulatory Visit (HOSPITAL_COMMUNITY): Payer: Self-pay | Admitting: Internal Medicine

## 2013-10-30 DIAGNOSIS — I501 Left ventricular failure: Secondary | ICD-10-CM

## 2013-11-11 ENCOUNTER — Ambulatory Visit (HOSPITAL_COMMUNITY)
Admission: RE | Admit: 2013-11-11 | Discharge: 2013-11-11 | Disposition: A | Discharge status: Home / Self Care | Payer: BC Managed Care – PPO | Source: Ambulatory Visit | Attending: Cardiovascular Disease | Admitting: Cardiovascular Disease

## 2013-11-11 DIAGNOSIS — I369 Nonrheumatic tricuspid valve disorder, unspecified: Secondary | ICD-10-CM

## 2013-11-11 DIAGNOSIS — I501 Left ventricular failure: Secondary | ICD-10-CM

## 2013-11-11 NOTE — Progress Notes (Signed)
2D Echocardiogram Complete.  11/11/2013   Zyanne Schumm, RDCS 

## 2015-01-24 IMAGING — CR DG ABDOMEN ACUTE W/ 1V CHEST
4 series · 4 of 4 positions shown · non-contrast
Comparison: Chest 11/01/2008.  Abdomen 04/14/2012

CLINICAL DATA: Abdominal pain, nausea, and vomiting.

ACUTE ABDOMEN SERIES (ABDOMEN 2 VIEW & CHEST 1 VIEW)

[w chest pa]
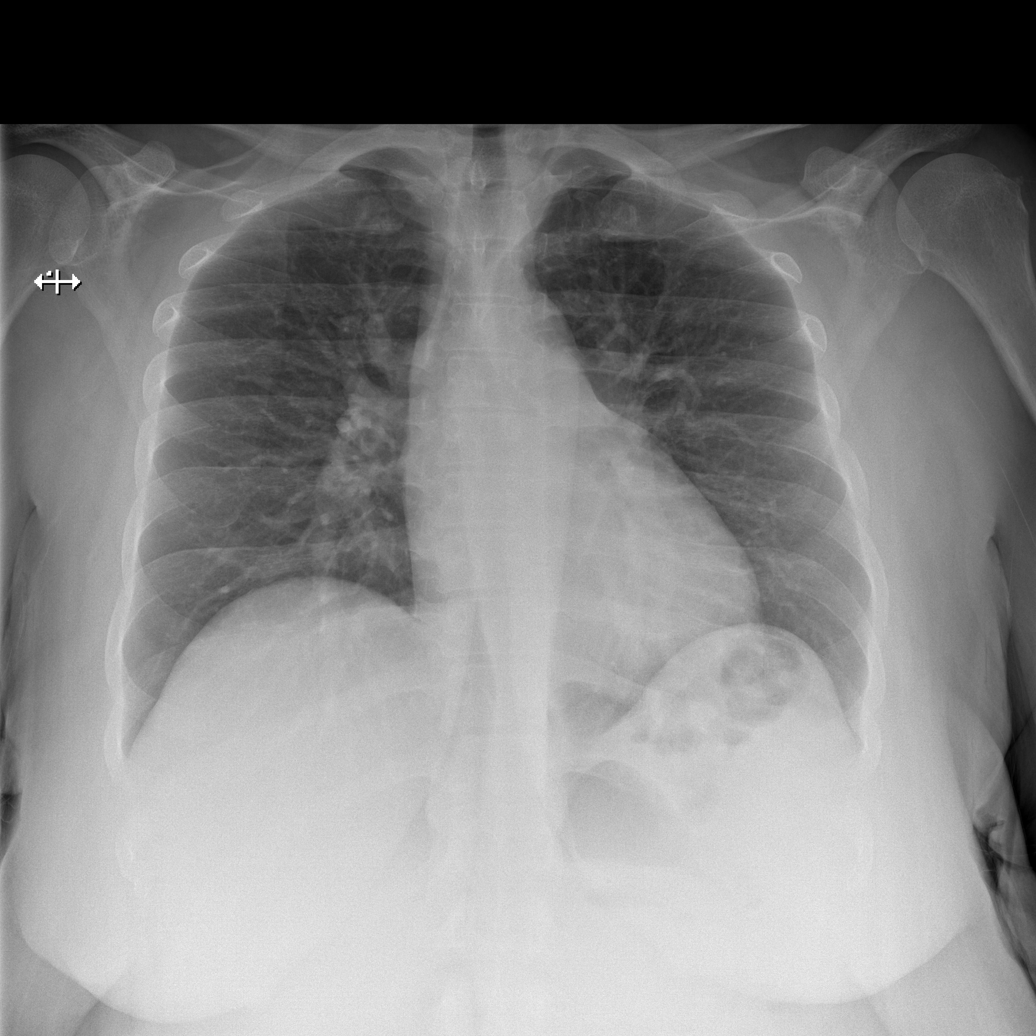

[w abdomen upright]
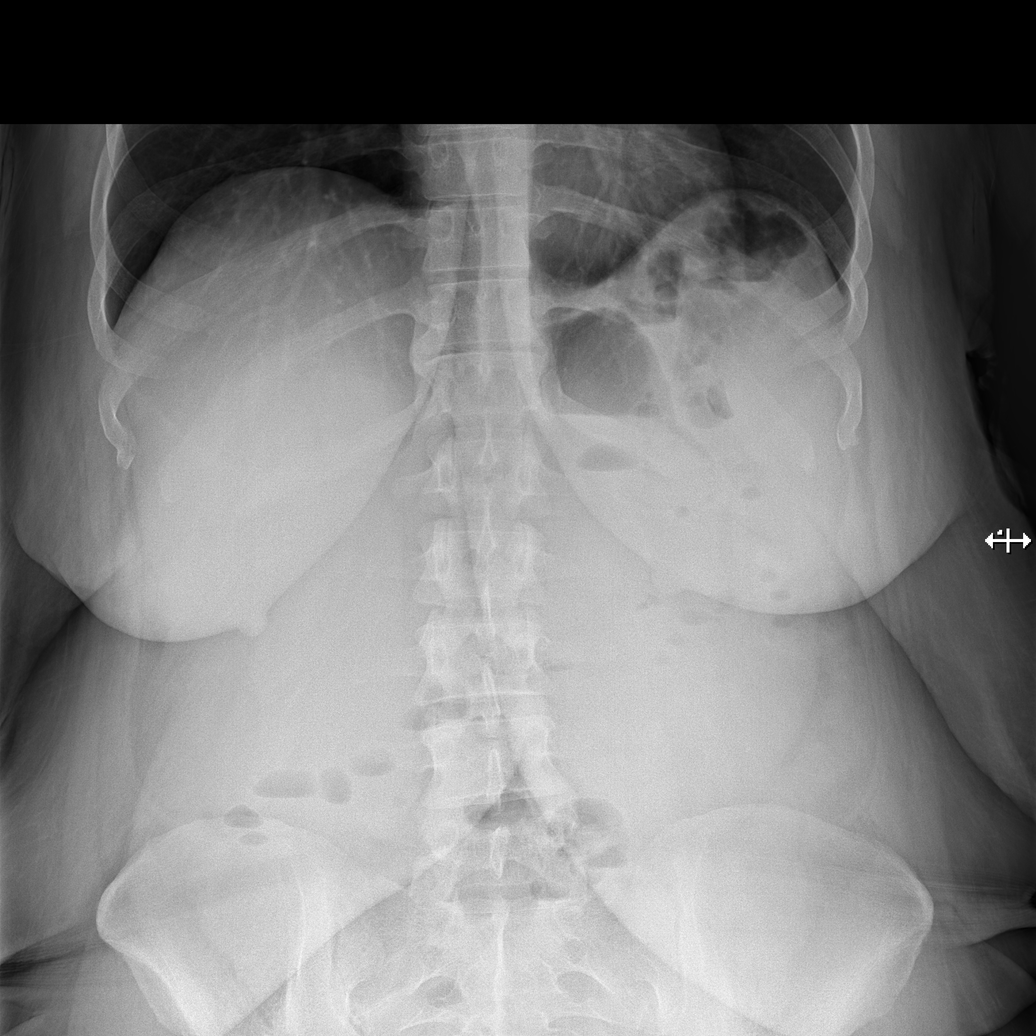

[t abdomen supine (1 of 2)]
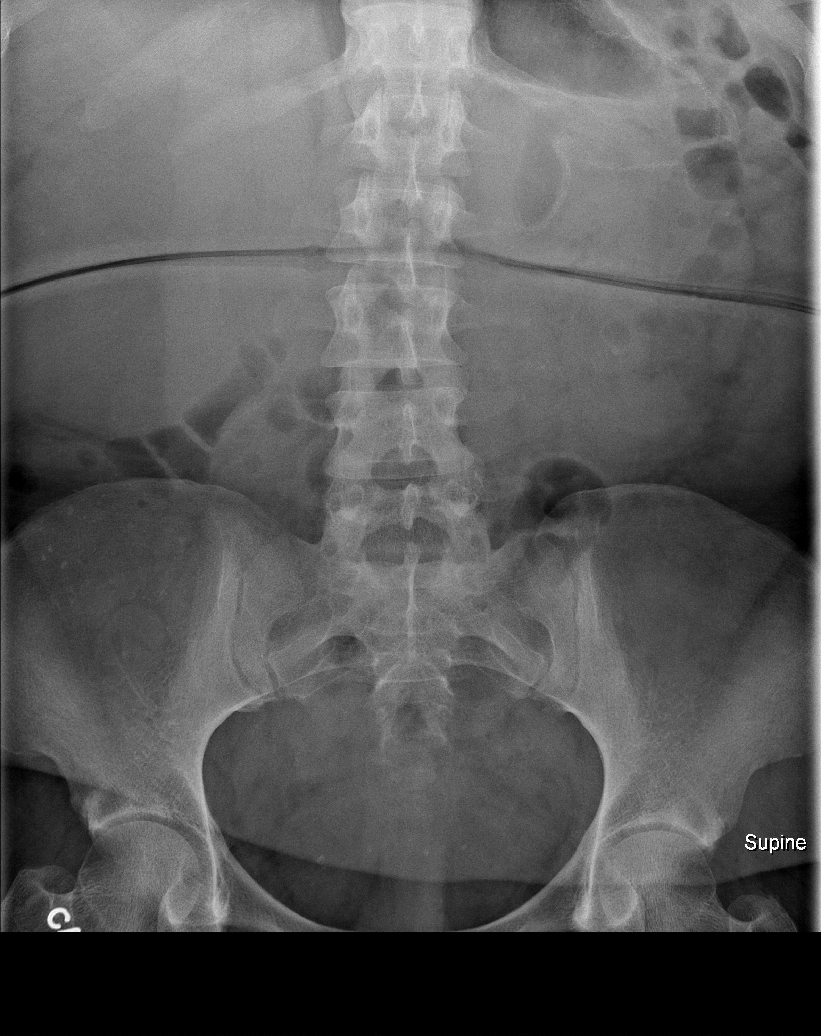

[t abdomen supine (2 of 2)]
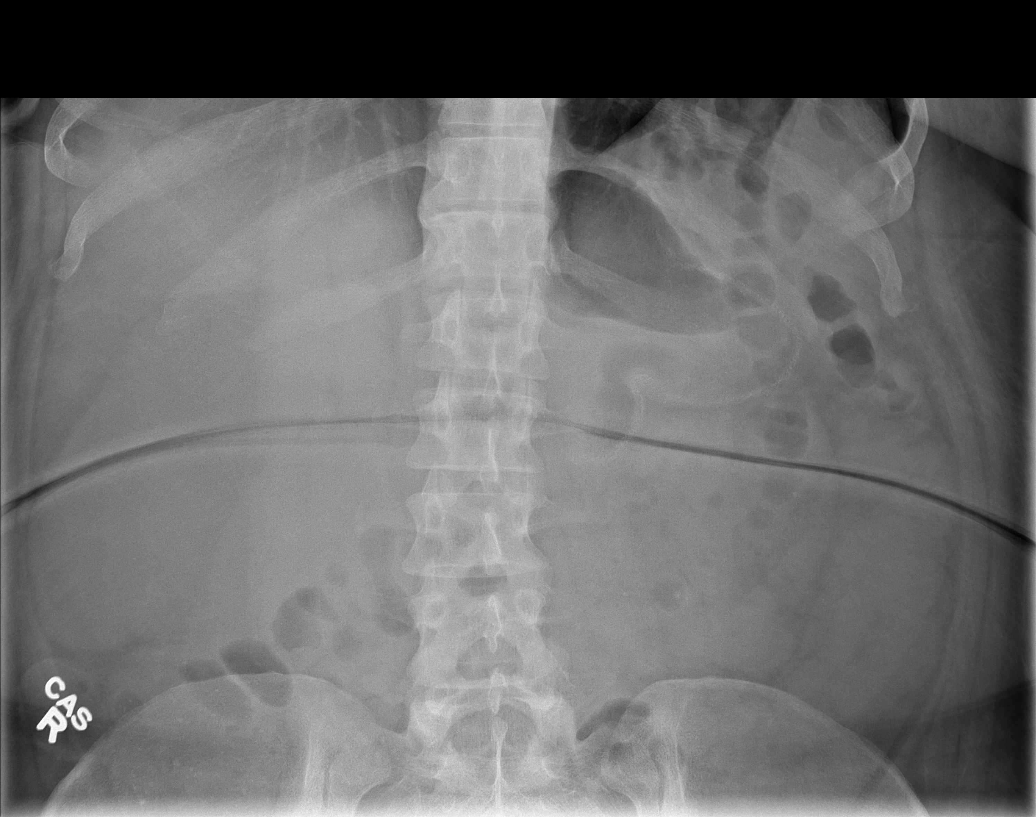

[4 of 4 positions shown; findings below may reference images not displayed]

FINDINGS: Shallow inspiration.  Normal heart size and pulmonary
vascularity.  No focal airspace consolidation in the lungs.  No
blunting of costophrenic angles.  No pneumothorax.  Mediastinal
contours appear intact.  No significant changes since the previous
study.

Scattered gas and stool in the colon.  No small or large bowel
distension.  No free intra-abdominal air.  No abnormal air fluid
levels.  Postoperative changes in the left upper quadrant.  No
radiopaque stones identified.  Phleboliths in the pelvis.
Visualized bones appear intact.
IMPRESSION: No evidence of active pulmonary disease.  Nonobstructive bowel gas
pattern.

## 2015-03-30 ENCOUNTER — Encounter (HOSPITAL_COMMUNITY): Payer: Self-pay | Admitting: Emergency Medicine

## 2015-03-30 ENCOUNTER — Emergency Department (HOSPITAL_COMMUNITY)
Admission: EM | Admit: 2015-03-30 | Discharge: 2015-03-31 | Disposition: A | Discharge status: Home / Self Care | Payer: BLUE CROSS/BLUE SHIELD | Attending: Emergency Medicine | Admitting: Emergency Medicine

## 2015-03-30 DIAGNOSIS — Z87891 Personal history of nicotine dependence: Secondary | ICD-10-CM | POA: Diagnosis not present

## 2015-03-30 DIAGNOSIS — K838 Other specified diseases of biliary tract: Secondary | ICD-10-CM

## 2015-03-30 DIAGNOSIS — R197 Diarrhea, unspecified: Secondary | ICD-10-CM | POA: Diagnosis not present

## 2015-03-30 DIAGNOSIS — E559 Vitamin D deficiency, unspecified: Secondary | ICD-10-CM | POA: Diagnosis not present

## 2015-03-30 DIAGNOSIS — Z79899 Other long term (current) drug therapy: Secondary | ICD-10-CM | POA: Insufficient documentation

## 2015-03-30 DIAGNOSIS — Z87440 Personal history of urinary (tract) infections: Secondary | ICD-10-CM | POA: Diagnosis not present

## 2015-03-30 DIAGNOSIS — K828 Other specified diseases of gallbladder: Secondary | ICD-10-CM | POA: Diagnosis not present

## 2015-03-30 DIAGNOSIS — I509 Heart failure, unspecified: Secondary | ICD-10-CM | POA: Insufficient documentation

## 2015-03-30 DIAGNOSIS — I10 Essential (primary) hypertension: Secondary | ICD-10-CM | POA: Insufficient documentation

## 2015-03-30 DIAGNOSIS — Z8669 Personal history of other diseases of the nervous system and sense organs: Secondary | ICD-10-CM | POA: Insufficient documentation

## 2015-03-30 DIAGNOSIS — E119 Type 2 diabetes mellitus without complications: Secondary | ICD-10-CM | POA: Diagnosis not present

## 2015-03-30 DIAGNOSIS — N8320 Unspecified ovarian cysts: Secondary | ICD-10-CM | POA: Diagnosis not present

## 2015-03-30 DIAGNOSIS — Z79818 Long term (current) use of other agents affecting estrogen receptors and estrogen levels: Secondary | ICD-10-CM | POA: Diagnosis not present

## 2015-03-30 DIAGNOSIS — R1084 Generalized abdominal pain: Secondary | ICD-10-CM | POA: Diagnosis present

## 2015-03-30 DIAGNOSIS — N83202 Unspecified ovarian cyst, left side: Secondary | ICD-10-CM

## 2015-03-30 DIAGNOSIS — Z9884 Bariatric surgery status: Secondary | ICD-10-CM | POA: Diagnosis not present

## 2015-03-30 DIAGNOSIS — Z862 Personal history of diseases of the blood and blood-forming organs and certain disorders involving the immune mechanism: Secondary | ICD-10-CM | POA: Insufficient documentation

## 2015-03-30 LAB — URINALYSIS, ROUTINE W REFLEX MICROSCOPIC
BILIRUBIN URINE: NEGATIVE
GLUCOSE, UA: NEGATIVE mg/dL
HGB URINE DIPSTICK: NEGATIVE
Ketones, ur: NEGATIVE mg/dL
Nitrite: NEGATIVE
PH: 6.5 (ref 5.0–8.0)
Protein, ur: NEGATIVE mg/dL
SPECIFIC GRAVITY, URINE: 1.023 (ref 1.005–1.030)
Urobilinogen, UA: 1 mg/dL (ref 0.0–1.0)

## 2015-03-30 LAB — URINE MICROSCOPIC-ADD ON

## 2015-03-30 NOTE — ED Notes (Signed)
Pt c/o lower abdominal pain x4 day. Two episodes of loose, mucous like, dark stools today. Denies N/V, fever/chills.

## 2015-03-31 ENCOUNTER — Encounter (HOSPITAL_COMMUNITY): Payer: Self-pay | Admitting: Radiology

## 2015-03-31 ENCOUNTER — Emergency Department (HOSPITAL_COMMUNITY): Payer: BLUE CROSS/BLUE SHIELD

## 2015-03-31 LAB — COMPREHENSIVE METABOLIC PANEL
ALBUMIN: 4 g/dL (ref 3.5–5.0)
ALK PHOS: 60 U/L (ref 38–126)
ALT: 19 U/L (ref 14–54)
AST: 26 U/L (ref 15–41)
Anion gap: 7 (ref 5–15)
BILIRUBIN TOTAL: 0.7 mg/dL (ref 0.3–1.2)
BUN: 13 mg/dL (ref 6–20)
CALCIUM: 8.6 mg/dL — AB (ref 8.9–10.3)
CO2: 25 mmol/L (ref 22–32)
Chloride: 103 mmol/L (ref 101–111)
Creatinine, Ser: 0.92 mg/dL (ref 0.44–1.00)
GFR calc Af Amer: >60 mL/min (ref >60)
GFR calc non Af Amer: >60 mL/min (ref >60)
GLUCOSE: 79 mg/dL (ref 65–99)
Potassium: 3.5 mmol/L (ref 3.5–5.1)
Sodium: 135 mmol/L (ref 135–145)
TOTAL PROTEIN: 7.6 g/dL (ref 6.5–8.1)

## 2015-03-31 LAB — CBC
HCT: 40.2 % (ref 36.0–46.0)
Hemoglobin: 13.2 g/dL (ref 12.0–15.0)
MCH: 29.3 pg (ref 26.0–34.0)
MCHC: 32.8 g/dL (ref 30.0–36.0)
MCV: 89.1 fL (ref 78.0–100.0)
Platelets: 277 10*3/uL (ref 150–400)
RBC: 4.51 MIL/uL (ref 3.87–5.11)
RDW: 13.5 % (ref 11.5–15.5)
WBC: 7.9 10*3/uL (ref 4.0–10.5)

## 2015-03-31 LAB — LIPASE, BLOOD: Lipase: 28 U/L (ref 22–51)

## 2015-03-31 MED ORDER — DICYCLOMINE HCL 20 MG PO TABS: 20.0000 mg | ORAL_TABLET | Freq: Three times a day (TID) | ORAL | Status: DC

## 2015-03-31 MED ORDER — IOHEXOL 300 MG/ML  SOLN
100.0000 mL | Freq: Once | INTRAMUSCULAR | Status: AC | PRN
  Administered 2015-03-31: 100 mL via INTRAVENOUS

## 2015-03-31 MED ORDER — DICYCLOMINE HCL 20 MG PO TABS
10.0000 mg | ORAL_TABLET | Freq: Once | ORAL | Status: AC
  Administered 2015-03-31: 10 mg via ORAL
  Filled 2015-03-31: qty 1

## 2015-03-31 MED ORDER — IOHEXOL 300 MG/ML  SOLN
50.0000 mL | Freq: Once | INTRAMUSCULAR | Status: AC | PRN
  Administered 2015-03-31: 50 mL via ORAL

## 2015-03-31 NOTE — Discharge Instructions (Signed)
Abdominal Pain Many things can cause belly (abdominal) pain. Most times, the belly pain is not dangerous. Many cases of belly pain can be watched and treated at home. HOME CARE   Do not take medicines that help you go poop (laxatives) unless told to by your doctor.  Only take medicine as told by your doctor.  Eat or drink as told by your doctor. Your doctor will tell you if you should be on a special diet. GET HELP IF:  You do not know what is causing your belly pain.  You have belly pain while you are sick to your stomach (nauseous) or have runny poop (diarrhea).  You have pain while you pee or poop.  Your belly pain wakes you up at night.  You have belly pain that gets worse or better when you eat.  You have belly pain that gets worse when you eat fatty foods.  You have a fever. GET HELP RIGHT AWAY IF:   The pain does not go away within 2 hours.  You keep throwing up (vomiting).  The pain changes and is only in the right or left part of the belly.  You have bloody or tarry looking poop. MAKE SURE YOU:   Understand these instructions.  Will watch your condition.  Will get help right away if you are not doing well or get worse. Document Released: 12/19/2007 Document Revised: 07/07/2013 Document Reviewed: 03/11/2013 [facility] Patient Information 2015 [place], [place]. This information is not intended to replace advice given to you by your health care provider. Make sure you discuss any questions you have with your health care provider. As discussed you do have gall bladder sludge but no stones, chronic hydronephrosis of the right kidney without obvious kidney stands your appendix and walls of your intestines appear to be within normal parameters .  Urine does not show any sign of infection. You have been given a prescription for medication called Bentyl, which is an anti-spasmatic, you can take this 30 minutes before meals and if needed before bed.  Also been given a referral  to a gastroenterologist.  Please call and make an appointment for further evaluation of your chronic abdominal discomfort.

## 2015-03-31 NOTE — ED Notes (Signed)
NP at pt bedside

## 2015-03-31 NOTE — ED Provider Notes (Signed)
CSN: [id number]     Arrival date & time 03/30/15  2253 History   First MD Initiated Contact with Patient 03/31/15 0119     Chief Complaint  Patient presents with  . Abdominal Pain     (Consider location/radiation/quality/duration/timing/severity/associated sxs/prior Treatment) HPI Comments: This is a 34 year old morbidly obese female, status post partial gastrectomy for weight loss present with 5 days of "gurgling" made worse with eating or drinking.  For the past 2 days.  The "gurgling has become painful, crampy like in nature, and she's had one episode of loose stools and one episode of vomiting.  Denies any fever, previous history of same. She tried taking Pepto-Bismol without any relief  Patient is a 34 y.o. female presenting with abdominal pain. The history is provided by the patient.  Abdominal Pain Pain location:  Generalized Pain quality: cramping   Pain radiates to:  Does not radiate Pain severity:  Mild Onset quality:  Gradual Timing:  Constant Progression:  Worsening Chronicity:  New Context: eating   Context: not alcohol use, not awakening from sleep, not diet changes, not laxative use, not recent sexual activity, not recent travel, not retching, not sick contacts, not suspicious food intake and not trauma   Relieved by:  Nothing Worsened by:  Eating Ineffective treatments:  OTC medications Associated symptoms: diarrhea, nausea and vomiting   Associated symptoms: no anorexia, no chest pain, no constipation, no cough, no dysuria, no fever and no shortness of breath     Past Medical History  Diagnosis Date  . Hypertension   . Hyperlipidemia   . Vitamin D deficiency   . CHF (congestive heart failure)     ECHO cardiogram schedule 07/11/11  . Sleep apnea     does not use CPAP  . Diabetes mellitus     no med - last A1C was 5.9  on 04/2011  . Anemia     history only - no current problem  . UTI (lower urinary tract infection)    Past Surgical History  Procedure  Laterality Date  . Upper gastrointestinal endoscopy    . Gastric bypass  01/15/2011  . Vulvar lesion removal  07/13/2011    Procedure: VULVAR LESION;  Surgeon: Serita Kyle, MD;  Location: [facility];  Service: Gynecology;  Laterality: Right;  Excision of right vulvar mass.   No family history on file. Social History  Substance Use Topics  . Smoking status: Former Smoker -- 0.25 packs/day for 1 years    Types: Cigarettes    Quit date: 12/14/2000  . Smokeless tobacco: Never Used  . Alcohol Use: Yes     Comment: Rarely - twice a year   OB History    No data available     Review of Systems  Constitutional: Negative for fever.  Respiratory: Negative for cough and shortness of breath.   Cardiovascular: Negative for chest pain.  Gastrointestinal: Positive for nausea, vomiting, abdominal pain and diarrhea. Negative for constipation and anorexia.  Genitourinary: Negative for dysuria and menstrual problem.  Musculoskeletal: Negative for myalgias and back pain.  All other systems reviewed and are negative.     Allergies  Floxin and Sulfa antibiotics  Home Medications   Prior to Admission medications   Medication Sig Start Date End Date Taking? Authorizing Provider  acetaminophen (TYLENOL) 500 MG tablet Take 1,000 mg by mouth every 6 (six) hours as needed for mild pain.   Yes Historical Provider, MD  carvedilol (COREG) 12.5 MG tablet Take 12.5 mg by mouth  2 (two) times daily with a meal. Pt only taking once a day even though prescribed for two.   Yes Historical Provider, MD  cholecalciferol (VITAMIN D) 1000 UNITS tablet Take 5,000 Units by mouth daily.    Yes Historical Provider, MD  dicyclomine (BENTYL) 20 MG tablet Take 1 tablet (20 mg total) by mouth 3 (three) times daily before meals. 03/31/15   Earley Favor, NP  lisinopril-hydrochlorothiazide (PRINZIDE,ZESTORETIC) 20-12.5 MG per tablet Take 1 tablet by mouth 2 (two) times daily.     Yes Historical Provider, MD  Multiple Vitamin  (MULTIVITAMIN WITH MINERALS) TABS tablet Take 1 tablet by mouth daily.   Yes Historical Provider, MD  norgestimate-ethinyl estradiol (ORTHO-CYCLEN,SPRINTEC,PREVIFEM) 0.25-35 MG-MCG tablet Take 1 tablet by mouth daily.   Yes Historical Provider, MD  ondansetron (ZOFRAN) 4 MG tablet Take 1 tablet (4 mg total) by mouth every 8 (eight) hours as needed for nausea. Patient not taking: Reported on 03/31/2015 08/18/12   Samuel Jester, DO  ranitidine (ZANTAC) 150 MG tablet Take 150 mg by mouth 2 (two) times daily.    Yes Historical Provider, MD  valACYclovir (VALTREX) 500 MG tablet Take 500 mg by mouth daily.   Yes Historical Provider, MD  VYVANSE 30 MG capsule Take 1 capsule by mouth daily. 02/27/15  Yes Historical Provider, MD   BP 106/55 mmHg  Pulse 86  Temp(Src) 98.4 F (36.9 C) (Oral)  Resp 18  SpO2 99%  LMP 03/11/2015 (Exact Date) Physical Exam  Constitutional: She is oriented to person, place, and time. She appears well-nourished. No distress.  HENT:  Head: Normocephalic and atraumatic.  Eyes: Pupils are equal, round, and reactive to light.  Neck: Normal range of motion.  Cardiovascular: Normal rate.   Pulmonary/Chest: Effort normal and breath sounds normal.  Abdominal: Soft. Bowel sounds are normal. She exhibits no distension. There is no tenderness. There is no rebound and no guarding.  Musculoskeletal: Normal range of motion.  Neurological: She is alert and oriented to person, place, and time.  Skin: Skin is warm. No rash noted.  Nursing note and vitals reviewed.   ED Course  Procedures (including critical care time) Labs Review Labs Reviewed  COMPREHENSIVE METABOLIC PANEL - Abnormal; Notable for the following:    Calcium 8.6 (*)    All other components within normal limits  URINALYSIS, ROUTINE W REFLEX MICROSCOPIC (NOT AT [facility]) - Abnormal; Notable for the following:    APPearance CLOUDY (*)    Leukocytes, UA MODERATE (*)    All other components within normal limits  URINE  MICROSCOPIC-ADD ON - Abnormal; Notable for the following:    Squamous Epithelial / LPF FEW (*)    All other components within normal limits  LIPASE, BLOOD  CBC    Imaging Review Ct Abdomen Pelvis W Contrast  03/31/2015   CLINICAL DATA:  Lower abdominal pain for 4 days, diarrhea. History of gastric bypass, diabetes, hypertension, renal cysts.  EXAM: CT ABDOMEN AND PELVIS WITH CONTRAST  TECHNIQUE: Multidetector CT imaging of the abdomen and pelvis was performed using the standard protocol following bolus administration of intravenous contrast.  CONTRAST:  OMNIPAQUE IOHEXOL 300 MG/ML SOLN, 56mL OMNIPAQUE IOHEXOL 300 MG/ML SOLN  COMPARISON:  CT abdomen and pelvis August 18, 2012  FINDINGS: Large body habitus results in overall noisy image quality.  LUNG BASES: Included view of the lung bases are clear. Visualized heart and pericardium are unremarkable.  SOLID ORGANS: The liver, spleen, pancreas and adrenal glands are unremarkable. Small amount of layering probable  gallbladder sludge.  GASTROINTESTINAL TRACT: Small hiatal hernia, status post gastric bypass surgery. The small and large bowel are normal in course and caliber without inflammatory changes. Normal appendix.  KIDNEYS/ URINARY TRACT: Kidneys are orthotopic. Severe RIGHT cortical thinning, and delayed nephrogram associated with similar severe hydronephrosis, in cortical cysts scarring of the inner polar and lower pole. LEFT extra renal pelvis. No nephrolithiasis, or solid renal masses. The unopacified ureters are normal in course and caliber. Urinary bladder is partially distended and unremarkable.  PERITONEUM/RETROPERITONEUM: Aortoiliac vessels are normal in course and caliber, minimal calcific atherosclerosis. No lymphadenopathy by CT size criteria. Internal reproductive organs are normal; LEFT adnexal crenulated peripherally enhancing collapsing 18 mm corpus luteal cyst. No intraperitoneal free fluid nor free air.  SOFT TISSUE/OSSEOUS  STRUCTURES: Non-suspicious. Prominent LEFT Bartholin cyst.  IMPRESSION: 18 mm collapsing LEFT corpus luteal cyst.  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  Suspected gallbladder sludge without CT findings of acute cholecystitis.   Electronically Signed   By: Awilda Metro M.D.   On: 03/31/2015 04:34   I have personally reviewed and evaluated these images and lab results as part of my medical decision-making.   EKG Interpretation None     I discussed all the lab findings.  CT scan results with patient.  She was aware of her kidney issue.  She does have an OB/GYN that she can follow-up with as far as her ovarian cyst.  She's been given a referral to a gastroenterologist as she's never seen one, although she's had multiple GI issues since being a small child.  She began a prescription for Bentyl to help control her crampy type pain.  She's been instructed she can take this 30 minutes prior to meals and if needed prior to bedtime MDM   Final diagnoses:  Cyst of left ovary  Biliary sludge  Generalized abdominal pain         Earley Favor, NP 03/31/15 1610  Loren Racer, MD 03/31/15 2312

## 2015-03-31 NOTE — ED Notes (Signed)
Pt from home states she has had an upset stomach since Saturday; "more gurgling than normal". She states starting Tuesday she had diarrhea. Pt does not exhibit dehydration and is able to tolerate PO fluids. Pt states "when I eat soup, I feel fine, but when I ate a deli sandwich my stomach hurts." Pt denies pain, nausea, or vomiting at this time

## 2015-04-19 ENCOUNTER — Emergency Department (HOSPITAL_COMMUNITY)
Admission: EM | Admit: 2015-04-19 | Discharge: 2015-04-20 | Disposition: A | Discharge status: Home / Self Care | Payer: BLUE CROSS/BLUE SHIELD | Attending: Emergency Medicine | Admitting: Emergency Medicine

## 2015-04-19 ENCOUNTER — Encounter (HOSPITAL_COMMUNITY): Payer: Self-pay | Admitting: Emergency Medicine

## 2015-04-19 DIAGNOSIS — Y998 Other external cause status: Secondary | ICD-10-CM | POA: Diagnosis not present

## 2015-04-19 DIAGNOSIS — Y9389 Activity, other specified: Secondary | ICD-10-CM | POA: Diagnosis not present

## 2015-04-19 DIAGNOSIS — Z87440 Personal history of urinary (tract) infections: Secondary | ICD-10-CM | POA: Diagnosis not present

## 2015-04-19 DIAGNOSIS — S81831A Puncture wound without foreign body, right lower leg, initial encounter: Secondary | ICD-10-CM | POA: Insufficient documentation

## 2015-04-19 DIAGNOSIS — Z862 Personal history of diseases of the blood and blood-forming organs and certain disorders involving the immune mechanism: Secondary | ICD-10-CM | POA: Diagnosis not present

## 2015-04-19 DIAGNOSIS — I10 Essential (primary) hypertension: Secondary | ICD-10-CM | POA: Diagnosis not present

## 2015-04-19 DIAGNOSIS — E559 Vitamin D deficiency, unspecified: Secondary | ICD-10-CM | POA: Diagnosis not present

## 2015-04-19 DIAGNOSIS — Z79899 Other long term (current) drug therapy: Secondary | ICD-10-CM | POA: Diagnosis not present

## 2015-04-19 DIAGNOSIS — Y9289 Other specified places as the place of occurrence of the external cause: Secondary | ICD-10-CM | POA: Diagnosis not present

## 2015-04-19 DIAGNOSIS — W540XXA Bitten by dog, initial encounter: Secondary | ICD-10-CM | POA: Diagnosis not present

## 2015-04-19 DIAGNOSIS — E119 Type 2 diabetes mellitus without complications: Secondary | ICD-10-CM | POA: Insufficient documentation

## 2015-04-19 DIAGNOSIS — E785 Hyperlipidemia, unspecified: Secondary | ICD-10-CM | POA: Diagnosis not present

## 2015-04-19 DIAGNOSIS — Z87891 Personal history of nicotine dependence: Secondary | ICD-10-CM | POA: Diagnosis not present

## 2015-04-19 DIAGNOSIS — S81801A Unspecified open wound, right lower leg, initial encounter: Secondary | ICD-10-CM | POA: Diagnosis present

## 2015-04-19 DIAGNOSIS — I509 Heart failure, unspecified: Secondary | ICD-10-CM | POA: Insufficient documentation

## 2015-04-19 NOTE — ED Notes (Signed)
Pt presents for post exposure to dog bite on 9/20. Pt has two bite marks to right lateral calf. Denies c/o at this time.

## 2015-04-20 NOTE — ED Provider Notes (Signed)
CSN: [id number]     Arrival date & time 04/19/15  2301 History   By signing my name below, I, Gwenyth Ober, attest that this documentation has been prepared under the direction and in the presence of Antony Madura, PA-C  Electronically Signed: Gwenyth Ober, ED Scribe. 04/20/2015. 12:13 AM.  Chief Complaint  Patient presents with  . Animal Bite   The history is provided by the patient. No language interpreter was used.   HPI Comments: Kathryn [name] is a 34 y.o. female who presents to the Emergency Department for a post-rabies exposure vaccination. Pt reports that she suffered a dog bite to her right lateral calf on 9/20. She was seen at Urgent Care and was given wound care. Pt states that the dog had previously bit her and was on home confinement. Per patient the dog escaped her cage during home confinement, eating all the pastries in the kitchen which caused the dog to subsequently pass away. The dog was buried in the woods behind the patient's home. Patient states the dog never showed any signs of being rabid prior to her passing. The dog had all of its vaccinations the week before the dog bite occurred. She was called by the Health Department today and told she had to get a rabies vaccination.   Past Medical History  Diagnosis Date  . Hypertension   . Hyperlipidemia   . Vitamin D deficiency   . CHF (congestive heart failure) (HCC)     ECHO cardiogram schedule 07/11/11  . Sleep apnea     does not use CPAP  . Diabetes mellitus     no med - last A1C was 5.9  on 04/2011  . Anemia     history only - no current problem  . UTI (lower urinary tract infection)    Past Surgical History  Procedure Laterality Date  . Upper gastrointestinal endoscopy    . Gastric bypass  01/15/2011  . Vulvar lesion removal  07/13/2011    Procedure: VULVAR LESION;  Surgeon: Serita Kyle, MD;  Location: [facility];  Service: Gynecology;  Laterality: Right;  Excision of right vulvar mass.   No family  history on file. Social History  Substance Use Topics  . Smoking status: Former Smoker -- 0.25 packs/day for 1 years    Types: Cigarettes    Quit date: 12/14/2000  . Smokeless tobacco: Never Used  . Alcohol Use: Yes     Comment: Rarely - twice a year   OB History    No data available     Review of Systems  Skin: Positive for wound.  All other systems reviewed and are negative.  Allergies  Floxin and Sulfa antibiotics  Home Medications   Prior to Admission medications   Medication Sig Start Date End Date Taking? Authorizing Provider  acetaminophen (TYLENOL) 500 MG tablet Take 1,000 mg by mouth every 6 (six) hours as needed for mild pain.    Historical Provider, MD  carvedilol (COREG) 12.5 MG tablet Take 12.5 mg by mouth 2 (two) times daily with a meal. Pt only taking once a day even though prescribed for two.    Historical Provider, MD  cholecalciferol (VITAMIN D) 1000 UNITS tablet Take 5,000 Units by mouth daily.     Historical Provider, MD  dicyclomine (BENTYL) 20 MG tablet Take 1 tablet (20 mg total) by mouth 3 (three) times daily before meals. 03/31/15   Earley Favor, NP  lisinopril-hydrochlorothiazide (PRINZIDE,ZESTORETIC) 20-12.5 MG per tablet Take 1 tablet by mouth  2 (two) times daily.      Historical Provider, MD  Multiple Vitamin (MULTIVITAMIN WITH MINERALS) TABS tablet Take 1 tablet by mouth daily.    Historical Provider, MD  norgestimate-ethinyl estradiol (ORTHO-CYCLEN,SPRINTEC,PREVIFEM) 0.25-35 MG-MCG tablet Take 1 tablet by mouth daily.    Historical Provider, MD  ondansetron (ZOFRAN) 4 MG tablet Take 1 tablet (4 mg total) by mouth every 8 (eight) hours as needed for nausea. Patient not taking: Reported on 03/31/2015 08/18/12   Samuel Jester, DO  ranitidine (ZANTAC) 150 MG tablet Take 150 mg by mouth 2 (two) times daily.     Historical Provider, MD  valACYclovir (VALTREX) 500 MG tablet Take 500 mg by mouth daily.    Historical Provider, MD  VYVANSE 30 MG capsule Take 1  capsule by mouth daily. 02/27/15   Historical Provider, MD   BP 146/100 mmHg  Pulse 93  Temp(Src) 98 F (36.7 C) (Oral)  Resp 18  SpO2 94%  LMP 04/11/2015   Physical Exam  Constitutional: She is oriented to person, place, and time. She appears well-developed and well-nourished. No distress.  Nontoxic/nonseptic appearing  HENT:  Head: Normocephalic and atraumatic.  Eyes: Conjunctivae and EOM are normal. No scleral icterus.  Neck: Normal range of motion.  Pulmonary/Chest: Effort normal. No respiratory distress.  Musculoskeletal: Normal range of motion.  Neurological: She is alert and oriented to person, place, and time.  Skin: Skin is warm and dry. No rash noted. She is not diaphoretic. No erythema. No pallor.  Scabbed puncture wounds to anterior RLE  Psychiatric: She has a normal mood and affect. Her behavior is normal.  Nursing note and vitals reviewed.   ED Course  Procedures   DIAGNOSTIC STUDIES: Oxygen Saturation is 94% on RA, adequate by my interpretation.    COORDINATION OF CARE: 12:14 AM Discussed treatment plan with pt which includes consultation with her attending physician. Pt agreed to plan.   MDM   Final diagnoses:  Dog bite    34 year old female presents to the emergency department for rabies vaccination. Patient was advised to have the rabies vaccine by the health department. Per patient, she is 14 days out from dog bite exposure. She states the dog was her own dog and was up to date on its shots. The dog unfortunately passed away during home confinement as it escaped's cage and ate all of the pastries in the kitchen. Patient, however, denies the dog showing any signs of developing rabies prior to its passing. Given history, time since dog bite, and vaccination status of the animal, do not believe rabies prophylaxis is indicated. Have advised primary care follow-up as needed and provided return precautions. Patient agreeable to plan with no unaddressed concerns.  Patient discharged in good condition.  I, Terri Malerba, personally performed the services described in this documentation. All medical record entries made by the scribe were at my direction and in my presence.  I have reviewed the chart and discharge instructions and agree that the record reflects my personal performance and is accurate and complete. Rafik Koppel.  04/20/2015. 12:37 AM.      Antony Madura, PA-C 04/20/15 0039  April Palumbo, MD 04/20/15 1610

## 2015-04-20 NOTE — Discharge Instructions (Signed)
There is no indication to give you a rabies vaccination today. Follow-up with your primary care doctor as needed. Return to the emergency department if symptoms worsen.  Animal Bite Animal bites can range from mild to serious. An animal bite can result in a scratch on the skin, a deep open cut, a puncture of the skin, a crush injury, or tearing away of the skin or a body part. A small bite from a house pet will usually not cause serious problems. However, some animal bites can become infected or injure a bone or other tissue.  Bites from certain animals can be more dangerous because of the risk of spreading rabies, which is a serious viral infection. This risk is higher with bites from stray animals or wild animals, such as raccoons, foxes, skunks, and bats. Dogs are responsible for most animal bites. Children are bitten more often than adults. SYMPTOMS  Common symptoms of an animal bite include:   Pain.   Bleeding.   Swelling.   Bruising.  DIAGNOSIS  This condition may be diagnosed based on a physical exam and medical history. Your health care provider will examine the wound and ask for details about the animal and how the bite happened. You may also have tests, such as:   Blood tests to check for infection or to determine if surgery is needed.  X-rays to check for damage to bones or joints.  Culture test. This uses a sample of fluid from the wound to check for infection. TREATMENT  Treatment varies depending on the location and type of animal bite and your medical history. Treatment may include:   Wound care. This often includes cleaning the wound, flushing the wound with saline solution, and applying a bandage (dressing). Sometimes, the wound is left open to heal because of the high risk of infection. However, in some cases, the wound may be closed with stitches (sutures), staples, skin glue, or adhesive strips.   Antibiotic medicine.   Tetanus shot.   Rabies treatment if  the animal could have rabies.  In some cases, bites that have become infected may require IV antibiotics and surgical treatment in the hospital.  HOME CARE INSTRUCTIONS Wound Care  Follow instructions from your health care provider about how to take care of your wound. Make sure you:  Wash your hands with soap and water before you change your dressing. If soap and water are not available, use hand sanitizer.  Change your dressing as told by your health care provider.  Leave sutures, skin glue, or adhesive strips in place. These skin closures may need to be in place for 2 weeks or longer. If adhesive strip edges start to loosen and curl up, you may trim the loose edges. Do not remove adhesive strips completely unless your health care provider tells you to do that.  Check your wound every day for signs of infection. Watch for:   Increasing redness, swelling, or pain.   Fluid, blood, or pus.  General Instructions  Take or apply over-the-counter and prescription medicines only as told by your health care provider.   If you were prescribed an antibiotic, take or apply it as told by your health care provider. Do not stop using the antibiotic even if your condition improves.   Keep the injured area raised (elevated) above the level of your heart while you are sitting or lying down, if this is possible.   If directed, apply ice to the injured area.   Put ice in a  plastic bag.   Place a towel between your skin and the bag.   Leave the ice on for 20 minutes, 2-3 times per day.   Keep all follow-up visits as told by your health care provider. This is important.  SEEK MEDICAL CARE IF:  You have increasing redness, swelling, or pain at the site of your wound.   You have a general feeling of sickness (malaise).   You feel nauseous or you vomit.   You have pain that does not get better.  SEEK IMMEDIATE MEDICAL CARE IF:  You have a red streak extending away from  your wound.   You have fluid, blood, or pus coming from your wound.   You have a fever or chills.   You have trouble moving your injured area.   You have numbness or tingling extending beyond the wound.   This information is not intended to replace advice given to you by your health care provider. Make sure you discuss any questions you have with your health care provider.   Document Released: 03/20/2011 Document Revised: 03/23/2015 Document Reviewed: 11/17/2014 Elsevier Interactive Patient Education [employer]

## 2015-10-27 DIAGNOSIS — A630 Anogenital (venereal) warts: Secondary | ICD-10-CM | POA: Diagnosis not present

## 2016-02-27 DIAGNOSIS — R1084 Generalized abdominal pain: Secondary | ICD-10-CM | POA: Diagnosis not present

## 2016-02-27 DIAGNOSIS — I10 Essential (primary) hypertension: Secondary | ICD-10-CM | POA: Diagnosis not present

## 2016-03-20 DIAGNOSIS — E559 Vitamin D deficiency, unspecified: Secondary | ICD-10-CM | POA: Diagnosis not present

## 2016-03-20 DIAGNOSIS — E119 Type 2 diabetes mellitus without complications: Secondary | ICD-10-CM | POA: Diagnosis not present

## 2016-03-20 DIAGNOSIS — I10 Essential (primary) hypertension: Secondary | ICD-10-CM | POA: Diagnosis not present

## 2016-03-26 DIAGNOSIS — I5032 Chronic diastolic (congestive) heart failure: Secondary | ICD-10-CM | POA: Diagnosis not present

## 2016-03-26 DIAGNOSIS — I10 Essential (primary) hypertension: Secondary | ICD-10-CM | POA: Diagnosis not present

## 2016-03-26 DIAGNOSIS — Z23 Encounter for immunization: Secondary | ICD-10-CM | POA: Diagnosis not present

## 2016-03-26 DIAGNOSIS — E119 Type 2 diabetes mellitus without complications: Secondary | ICD-10-CM | POA: Diagnosis not present

## 2016-03-26 DIAGNOSIS — E785 Hyperlipidemia, unspecified: Secondary | ICD-10-CM | POA: Diagnosis not present

## 2016-08-15 ENCOUNTER — Ambulatory Visit (INDEPENDENT_AMBULATORY_CARE_PROVIDER_SITE_OTHER): Payer: BLUE CROSS/BLUE SHIELD | Admitting: Psychiatry

## 2016-08-15 DIAGNOSIS — F4324 Adjustment disorder with disturbance of conduct: Secondary | ICD-10-CM

## 2016-08-28 ENCOUNTER — Ambulatory Visit (INDEPENDENT_AMBULATORY_CARE_PROVIDER_SITE_OTHER): Payer: BLUE CROSS/BLUE SHIELD | Admitting: Psychiatry

## 2016-08-28 DIAGNOSIS — F4324 Adjustment disorder with disturbance of conduct: Secondary | ICD-10-CM | POA: Diagnosis not present

## 2016-09-11 ENCOUNTER — Ambulatory Visit (INDEPENDENT_AMBULATORY_CARE_PROVIDER_SITE_OTHER): Payer: BLUE CROSS/BLUE SHIELD | Admitting: Psychiatry

## 2016-09-11 DIAGNOSIS — Z6841 Body Mass Index (BMI) 40.0 and over, adult: Secondary | ICD-10-CM | POA: Diagnosis not present

## 2016-09-11 DIAGNOSIS — F4324 Adjustment disorder with disturbance of conduct: Secondary | ICD-10-CM

## 2016-09-11 DIAGNOSIS — R87610 Atypical squamous cells of undetermined significance on cytologic smear of cervix (ASC-US): Secondary | ICD-10-CM | POA: Diagnosis not present

## 2016-09-11 DIAGNOSIS — Z1151 Encounter for screening for human papillomavirus (HPV): Secondary | ICD-10-CM | POA: Diagnosis not present

## 2016-09-11 DIAGNOSIS — Z01419 Encounter for gynecological examination (general) (routine) without abnormal findings: Secondary | ICD-10-CM | POA: Diagnosis not present

## 2016-09-12 ENCOUNTER — Ambulatory Visit (INDEPENDENT_AMBULATORY_CARE_PROVIDER_SITE_OTHER): Payer: BLUE CROSS/BLUE SHIELD | Admitting: Licensed Clinical Social Worker

## 2016-09-12 DIAGNOSIS — F4324 Adjustment disorder with disturbance of conduct: Secondary | ICD-10-CM

## 2016-09-21 DIAGNOSIS — Z0001 Encounter for general adult medical examination with abnormal findings: Secondary | ICD-10-CM | POA: Diagnosis not present

## 2016-09-21 DIAGNOSIS — E559 Vitamin D deficiency, unspecified: Secondary | ICD-10-CM | POA: Diagnosis not present

## 2016-09-21 DIAGNOSIS — E119 Type 2 diabetes mellitus without complications: Secondary | ICD-10-CM | POA: Diagnosis not present

## 2016-09-21 DIAGNOSIS — I10 Essential (primary) hypertension: Secondary | ICD-10-CM | POA: Diagnosis not present

## 2016-09-25 ENCOUNTER — Ambulatory Visit: Payer: BLUE CROSS/BLUE SHIELD | Admitting: Psychiatry

## 2016-09-28 DIAGNOSIS — I5032 Chronic diastolic (congestive) heart failure: Secondary | ICD-10-CM | POA: Diagnosis not present

## 2016-09-28 DIAGNOSIS — E119 Type 2 diabetes mellitus without complications: Secondary | ICD-10-CM | POA: Diagnosis not present

## 2016-09-28 DIAGNOSIS — I10 Essential (primary) hypertension: Secondary | ICD-10-CM | POA: Diagnosis not present

## 2016-09-28 DIAGNOSIS — Z0000 Encounter for general adult medical examination without abnormal findings: Secondary | ICD-10-CM | POA: Diagnosis not present

## 2016-10-01 ENCOUNTER — Ambulatory Visit (INDEPENDENT_AMBULATORY_CARE_PROVIDER_SITE_OTHER): Payer: BLUE CROSS/BLUE SHIELD | Admitting: Licensed Clinical Social Worker

## 2016-10-01 DIAGNOSIS — F3341 Major depressive disorder, recurrent, in partial remission: Secondary | ICD-10-CM

## 2016-10-09 ENCOUNTER — Ambulatory Visit (INDEPENDENT_AMBULATORY_CARE_PROVIDER_SITE_OTHER): Payer: BLUE CROSS/BLUE SHIELD | Admitting: Psychiatry

## 2016-10-09 DIAGNOSIS — F4324 Adjustment disorder with disturbance of conduct: Secondary | ICD-10-CM | POA: Diagnosis not present

## 2016-10-10 DIAGNOSIS — R635 Abnormal weight gain: Secondary | ICD-10-CM | POA: Diagnosis not present

## 2016-10-10 DIAGNOSIS — I10 Essential (primary) hypertension: Secondary | ICD-10-CM | POA: Diagnosis not present

## 2016-10-10 DIAGNOSIS — R609 Edema, unspecified: Secondary | ICD-10-CM | POA: Diagnosis not present

## 2016-10-19 DIAGNOSIS — R635 Abnormal weight gain: Secondary | ICD-10-CM | POA: Diagnosis not present

## 2016-10-19 DIAGNOSIS — R609 Edema, unspecified: Secondary | ICD-10-CM | POA: Diagnosis not present

## 2016-10-19 DIAGNOSIS — I10 Essential (primary) hypertension: Secondary | ICD-10-CM | POA: Diagnosis not present

## 2016-10-23 ENCOUNTER — Ambulatory Visit (INDEPENDENT_AMBULATORY_CARE_PROVIDER_SITE_OTHER): Payer: BLUE CROSS/BLUE SHIELD | Admitting: Psychiatry

## 2016-10-23 DIAGNOSIS — F4324 Adjustment disorder with disturbance of conduct: Secondary | ICD-10-CM | POA: Diagnosis not present

## 2016-11-06 ENCOUNTER — Ambulatory Visit (INDEPENDENT_AMBULATORY_CARE_PROVIDER_SITE_OTHER): Payer: BLUE CROSS/BLUE SHIELD | Admitting: Psychiatry

## 2016-11-06 DIAGNOSIS — R87810 Cervical high risk human papillomavirus (HPV) DNA test positive: Secondary | ICD-10-CM | POA: Diagnosis not present

## 2016-11-06 DIAGNOSIS — F4324 Adjustment disorder with disturbance of conduct: Secondary | ICD-10-CM | POA: Diagnosis not present

## 2016-11-06 DIAGNOSIS — N72 Inflammatory disease of cervix uteri: Secondary | ICD-10-CM | POA: Diagnosis not present

## 2016-11-07 DIAGNOSIS — I10 Essential (primary) hypertension: Secondary | ICD-10-CM | POA: Diagnosis not present

## 2016-11-07 DIAGNOSIS — F900 Attention-deficit hyperactivity disorder, predominantly inattentive type: Secondary | ICD-10-CM | POA: Diagnosis not present

## 2016-11-07 DIAGNOSIS — R635 Abnormal weight gain: Secondary | ICD-10-CM | POA: Diagnosis not present

## 2016-11-07 DIAGNOSIS — R609 Edema, unspecified: Secondary | ICD-10-CM | POA: Diagnosis not present

## 2016-11-20 ENCOUNTER — Ambulatory Visit (INDEPENDENT_AMBULATORY_CARE_PROVIDER_SITE_OTHER): Payer: BLUE CROSS/BLUE SHIELD | Admitting: Psychiatry

## 2016-11-20 DIAGNOSIS — F4324 Adjustment disorder with disturbance of conduct: Secondary | ICD-10-CM

## 2016-12-04 ENCOUNTER — Ambulatory Visit (INDEPENDENT_AMBULATORY_CARE_PROVIDER_SITE_OTHER): Payer: BLUE CROSS/BLUE SHIELD | Admitting: Psychiatry

## 2016-12-04 DIAGNOSIS — F4324 Adjustment disorder with disturbance of conduct: Secondary | ICD-10-CM

## 2016-12-11 ENCOUNTER — Ambulatory Visit: Payer: BLUE CROSS/BLUE SHIELD | Admitting: Psychiatry

## 2016-12-18 ENCOUNTER — Ambulatory Visit: Payer: BLUE CROSS/BLUE SHIELD | Admitting: Psychiatry

## 2017-01-01 ENCOUNTER — Ambulatory Visit: Payer: BLUE CROSS/BLUE SHIELD | Admitting: Psychiatry

## 2017-01-15 ENCOUNTER — Ambulatory Visit: Payer: BLUE CROSS/BLUE SHIELD | Admitting: Psychiatry

## 2017-01-29 ENCOUNTER — Ambulatory Visit (INDEPENDENT_AMBULATORY_CARE_PROVIDER_SITE_OTHER): Payer: BLUE CROSS/BLUE SHIELD | Admitting: Psychiatry

## 2017-01-29 DIAGNOSIS — F4324 Adjustment disorder with disturbance of conduct: Secondary | ICD-10-CM

## 2017-02-12 ENCOUNTER — Ambulatory Visit (INDEPENDENT_AMBULATORY_CARE_PROVIDER_SITE_OTHER): Payer: BLUE CROSS/BLUE SHIELD | Admitting: Psychiatry

## 2017-02-12 DIAGNOSIS — F4324 Adjustment disorder with disturbance of conduct: Secondary | ICD-10-CM | POA: Diagnosis not present

## 2017-02-26 ENCOUNTER — Ambulatory Visit: Payer: Self-pay | Admitting: Psychiatry

## 2017-03-26 ENCOUNTER — Ambulatory Visit (INDEPENDENT_AMBULATORY_CARE_PROVIDER_SITE_OTHER): Payer: BLUE CROSS/BLUE SHIELD | Admitting: Psychiatry

## 2017-03-26 DIAGNOSIS — F4323 Adjustment disorder with mixed anxiety and depressed mood: Secondary | ICD-10-CM | POA: Diagnosis not present

## 2017-04-09 ENCOUNTER — Ambulatory Visit (INDEPENDENT_AMBULATORY_CARE_PROVIDER_SITE_OTHER): Payer: BLUE CROSS/BLUE SHIELD | Admitting: Psychiatry

## 2017-04-09 DIAGNOSIS — F309 Manic episode, unspecified: Secondary | ICD-10-CM

## 2017-05-07 ENCOUNTER — Ambulatory Visit: Payer: Self-pay | Admitting: Psychiatry

## 2017-05-21 ENCOUNTER — Ambulatory Visit (INDEPENDENT_AMBULATORY_CARE_PROVIDER_SITE_OTHER): Payer: BLUE CROSS/BLUE SHIELD | Admitting: Psychiatry

## 2017-05-21 DIAGNOSIS — F4324 Adjustment disorder with disturbance of conduct: Secondary | ICD-10-CM

## 2017-06-04 ENCOUNTER — Ambulatory Visit (INDEPENDENT_AMBULATORY_CARE_PROVIDER_SITE_OTHER): Payer: BLUE CROSS/BLUE SHIELD | Admitting: Psychiatry

## 2017-06-04 DIAGNOSIS — F4324 Adjustment disorder with disturbance of conduct: Secondary | ICD-10-CM

## 2017-06-12 DIAGNOSIS — E119 Type 2 diabetes mellitus without complications: Secondary | ICD-10-CM | POA: Diagnosis not present

## 2017-06-12 DIAGNOSIS — E785 Hyperlipidemia, unspecified: Secondary | ICD-10-CM | POA: Diagnosis not present

## 2017-06-12 DIAGNOSIS — E559 Vitamin D deficiency, unspecified: Secondary | ICD-10-CM | POA: Diagnosis not present

## 2017-06-12 DIAGNOSIS — I10 Essential (primary) hypertension: Secondary | ICD-10-CM | POA: Diagnosis not present

## 2017-06-14 DIAGNOSIS — E119 Type 2 diabetes mellitus without complications: Secondary | ICD-10-CM | POA: Diagnosis not present

## 2017-06-14 DIAGNOSIS — E785 Hyperlipidemia, unspecified: Secondary | ICD-10-CM | POA: Diagnosis not present

## 2017-06-14 DIAGNOSIS — I10 Essential (primary) hypertension: Secondary | ICD-10-CM | POA: Diagnosis not present

## 2017-06-14 DIAGNOSIS — Z23 Encounter for immunization: Secondary | ICD-10-CM | POA: Diagnosis not present

## 2017-06-14 DIAGNOSIS — I5032 Chronic diastolic (congestive) heart failure: Secondary | ICD-10-CM | POA: Diagnosis not present

## 2017-06-17 DIAGNOSIS — E0590 Thyrotoxicosis, unspecified without thyrotoxic crisis or storm: Secondary | ICD-10-CM | POA: Diagnosis not present

## 2017-06-20 ENCOUNTER — Ambulatory Visit: Payer: Self-pay | Admitting: Psychiatry

## 2017-07-02 ENCOUNTER — Ambulatory Visit (INDEPENDENT_AMBULATORY_CARE_PROVIDER_SITE_OTHER): Payer: BLUE CROSS/BLUE SHIELD | Admitting: Psychiatry

## 2017-07-02 DIAGNOSIS — F4324 Adjustment disorder with disturbance of conduct: Secondary | ICD-10-CM

## 2017-07-30 ENCOUNTER — Ambulatory Visit (INDEPENDENT_AMBULATORY_CARE_PROVIDER_SITE_OTHER): Payer: BLUE CROSS/BLUE SHIELD | Admitting: Psychiatry

## 2017-07-30 DIAGNOSIS — F4324 Adjustment disorder with disturbance of conduct: Secondary | ICD-10-CM | POA: Diagnosis not present

## 2017-07-30 DIAGNOSIS — F509 Eating disorder, unspecified: Secondary | ICD-10-CM

## 2017-08-13 ENCOUNTER — Ambulatory Visit: Payer: Self-pay | Admitting: Psychiatry

## 2017-08-27 ENCOUNTER — Ambulatory Visit (INDEPENDENT_AMBULATORY_CARE_PROVIDER_SITE_OTHER): Payer: BLUE CROSS/BLUE SHIELD | Admitting: Psychiatry

## 2017-08-27 DIAGNOSIS — F4324 Adjustment disorder with disturbance of conduct: Secondary | ICD-10-CM

## 2017-08-27 DIAGNOSIS — F509 Eating disorder, unspecified: Secondary | ICD-10-CM | POA: Diagnosis not present

## 2017-09-10 ENCOUNTER — Ambulatory Visit (INDEPENDENT_AMBULATORY_CARE_PROVIDER_SITE_OTHER): Payer: BLUE CROSS/BLUE SHIELD | Admitting: Psychiatry

## 2017-09-10 DIAGNOSIS — F4324 Adjustment disorder with disturbance of conduct: Secondary | ICD-10-CM

## 2017-09-23 DIAGNOSIS — E785 Hyperlipidemia, unspecified: Secondary | ICD-10-CM | POA: Diagnosis not present

## 2017-09-23 DIAGNOSIS — G4733 Obstructive sleep apnea (adult) (pediatric): Secondary | ICD-10-CM | POA: Diagnosis not present

## 2017-09-23 DIAGNOSIS — E119 Type 2 diabetes mellitus without complications: Secondary | ICD-10-CM | POA: Diagnosis not present

## 2017-09-23 DIAGNOSIS — R946 Abnormal results of thyroid function studies: Secondary | ICD-10-CM | POA: Diagnosis not present

## 2017-09-24 ENCOUNTER — Ambulatory Visit: Payer: Self-pay | Admitting: Psychiatry

## 2017-09-25 DIAGNOSIS — Z5181 Encounter for therapeutic drug level monitoring: Secondary | ICD-10-CM | POA: Diagnosis not present

## 2017-09-25 DIAGNOSIS — Z0000 Encounter for general adult medical examination without abnormal findings: Secondary | ICD-10-CM | POA: Diagnosis not present

## 2017-09-25 DIAGNOSIS — I5032 Chronic diastolic (congestive) heart failure: Secondary | ICD-10-CM | POA: Diagnosis not present

## 2017-09-25 DIAGNOSIS — E119 Type 2 diabetes mellitus without complications: Secondary | ICD-10-CM | POA: Diagnosis not present

## 2017-09-26 ENCOUNTER — Other Ambulatory Visit (HOSPITAL_COMMUNITY): Payer: Self-pay | Admitting: Internal Medicine

## 2017-09-26 DIAGNOSIS — E0590 Thyrotoxicosis, unspecified without thyrotoxic crisis or storm: Secondary | ICD-10-CM

## 2017-09-30 ENCOUNTER — Other Ambulatory Visit (HOSPITAL_COMMUNITY): Payer: Self-pay | Admitting: Internal Medicine

## 2017-09-30 DIAGNOSIS — R946 Abnormal results of thyroid function studies: Secondary | ICD-10-CM

## 2017-10-08 ENCOUNTER — Ambulatory Visit: Payer: Self-pay | Admitting: Psychiatry

## 2017-10-10 ENCOUNTER — Ambulatory Visit (HOSPITAL_COMMUNITY): Payer: BLUE CROSS/BLUE SHIELD

## 2017-10-11 ENCOUNTER — Other Ambulatory Visit (HOSPITAL_COMMUNITY): Payer: Self-pay

## 2017-10-22 ENCOUNTER — Ambulatory Visit (INDEPENDENT_AMBULATORY_CARE_PROVIDER_SITE_OTHER): Payer: BLUE CROSS/BLUE SHIELD | Admitting: Psychiatry

## 2017-10-22 DIAGNOSIS — F4324 Adjustment disorder with disturbance of conduct: Secondary | ICD-10-CM

## 2017-10-25 DIAGNOSIS — I10 Essential (primary) hypertension: Secondary | ICD-10-CM | POA: Diagnosis not present

## 2017-10-25 DIAGNOSIS — F988 Other specified behavioral and emotional disorders with onset usually occurring in childhood and adolescence: Secondary | ICD-10-CM | POA: Diagnosis not present

## 2017-10-25 DIAGNOSIS — E119 Type 2 diabetes mellitus without complications: Secondary | ICD-10-CM | POA: Diagnosis not present

## 2017-10-31 ENCOUNTER — Ambulatory Visit (HOSPITAL_COMMUNITY): Payer: Self-pay

## 2017-11-05 ENCOUNTER — Ambulatory Visit (INDEPENDENT_AMBULATORY_CARE_PROVIDER_SITE_OTHER): Payer: BLUE CROSS/BLUE SHIELD | Admitting: Psychiatry

## 2017-11-05 DIAGNOSIS — F4324 Adjustment disorder with disturbance of conduct: Secondary | ICD-10-CM

## 2017-11-05 DIAGNOSIS — F509 Eating disorder, unspecified: Secondary | ICD-10-CM | POA: Diagnosis not present

## 2017-11-09 DIAGNOSIS — M109 Gout, unspecified: Secondary | ICD-10-CM | POA: Diagnosis not present

## 2017-11-14 ENCOUNTER — Ambulatory Visit (INDEPENDENT_AMBULATORY_CARE_PROVIDER_SITE_OTHER): Payer: BLUE CROSS/BLUE SHIELD | Admitting: Psychiatry

## 2017-11-14 DIAGNOSIS — F509 Eating disorder, unspecified: Secondary | ICD-10-CM

## 2017-11-14 DIAGNOSIS — F4324 Adjustment disorder with disturbance of conduct: Secondary | ICD-10-CM

## 2017-11-19 ENCOUNTER — Ambulatory Visit: Payer: Self-pay | Admitting: Psychiatry

## 2017-12-03 ENCOUNTER — Ambulatory Visit (INDEPENDENT_AMBULATORY_CARE_PROVIDER_SITE_OTHER): Payer: BLUE CROSS/BLUE SHIELD | Admitting: Psychiatry

## 2017-12-03 DIAGNOSIS — F4324 Adjustment disorder with disturbance of conduct: Secondary | ICD-10-CM | POA: Diagnosis not present

## 2017-12-03 DIAGNOSIS — F509 Eating disorder, unspecified: Secondary | ICD-10-CM

## 2017-12-17 ENCOUNTER — Ambulatory Visit: Payer: BLUE CROSS/BLUE SHIELD | Admitting: Psychiatry

## 2017-12-31 ENCOUNTER — Ambulatory Visit (INDEPENDENT_AMBULATORY_CARE_PROVIDER_SITE_OTHER): Payer: BLUE CROSS/BLUE SHIELD | Admitting: Psychiatry

## 2017-12-31 DIAGNOSIS — F4324 Adjustment disorder with disturbance of conduct: Secondary | ICD-10-CM

## 2017-12-31 DIAGNOSIS — F509 Eating disorder, unspecified: Secondary | ICD-10-CM

## 2018-01-14 ENCOUNTER — Ambulatory Visit (INDEPENDENT_AMBULATORY_CARE_PROVIDER_SITE_OTHER): Payer: BLUE CROSS/BLUE SHIELD | Admitting: Psychiatry

## 2018-01-14 DIAGNOSIS — F509 Eating disorder, unspecified: Secondary | ICD-10-CM

## 2018-01-14 DIAGNOSIS — F4324 Adjustment disorder with disturbance of conduct: Secondary | ICD-10-CM

## 2018-01-28 ENCOUNTER — Ambulatory Visit (INDEPENDENT_AMBULATORY_CARE_PROVIDER_SITE_OTHER): Payer: BLUE CROSS/BLUE SHIELD | Admitting: Psychiatry

## 2018-01-28 DIAGNOSIS — F509 Eating disorder, unspecified: Secondary | ICD-10-CM

## 2018-01-28 DIAGNOSIS — F4324 Adjustment disorder with disturbance of conduct: Secondary | ICD-10-CM | POA: Diagnosis not present

## 2018-02-11 ENCOUNTER — Ambulatory Visit (INDEPENDENT_AMBULATORY_CARE_PROVIDER_SITE_OTHER): Payer: BLUE CROSS/BLUE SHIELD | Admitting: Psychiatry

## 2018-02-11 DIAGNOSIS — F509 Eating disorder, unspecified: Secondary | ICD-10-CM | POA: Diagnosis not present

## 2018-02-11 DIAGNOSIS — F4324 Adjustment disorder with disturbance of conduct: Secondary | ICD-10-CM

## 2018-02-25 ENCOUNTER — Ambulatory Visit (INDEPENDENT_AMBULATORY_CARE_PROVIDER_SITE_OTHER): Payer: BLUE CROSS/BLUE SHIELD | Admitting: Psychiatry

## 2018-02-25 DIAGNOSIS — F509 Eating disorder, unspecified: Secondary | ICD-10-CM

## 2018-02-25 DIAGNOSIS — F4324 Adjustment disorder with disturbance of conduct: Secondary | ICD-10-CM

## 2018-03-11 ENCOUNTER — Ambulatory Visit: Payer: BLUE CROSS/BLUE SHIELD | Admitting: Psychiatry

## 2018-03-20 ENCOUNTER — Ambulatory Visit (INDEPENDENT_AMBULATORY_CARE_PROVIDER_SITE_OTHER): Payer: BLUE CROSS/BLUE SHIELD | Admitting: Psychiatry

## 2018-03-20 DIAGNOSIS — F4324 Adjustment disorder with disturbance of conduct: Secondary | ICD-10-CM | POA: Diagnosis not present

## 2018-03-20 DIAGNOSIS — F509 Eating disorder, unspecified: Secondary | ICD-10-CM

## 2018-03-25 ENCOUNTER — Ambulatory Visit: Payer: BLUE CROSS/BLUE SHIELD | Admitting: Psychiatry

## 2018-04-08 ENCOUNTER — Ambulatory Visit (INDEPENDENT_AMBULATORY_CARE_PROVIDER_SITE_OTHER): Payer: BLUE CROSS/BLUE SHIELD | Admitting: Psychiatry

## 2018-04-08 DIAGNOSIS — F509 Eating disorder, unspecified: Secondary | ICD-10-CM | POA: Diagnosis not present

## 2018-04-08 DIAGNOSIS — F4324 Adjustment disorder with disturbance of conduct: Secondary | ICD-10-CM

## 2018-04-22 ENCOUNTER — Ambulatory Visit: Payer: BLUE CROSS/BLUE SHIELD | Admitting: Psychiatry

## 2018-05-06 ENCOUNTER — Ambulatory Visit (INDEPENDENT_AMBULATORY_CARE_PROVIDER_SITE_OTHER): Payer: BLUE CROSS/BLUE SHIELD | Admitting: Psychiatry

## 2018-05-06 DIAGNOSIS — F4324 Adjustment disorder with disturbance of conduct: Secondary | ICD-10-CM

## 2018-05-06 DIAGNOSIS — F509 Eating disorder, unspecified: Secondary | ICD-10-CM

## 2018-05-19 DIAGNOSIS — E119 Type 2 diabetes mellitus without complications: Secondary | ICD-10-CM | POA: Diagnosis not present

## 2018-05-19 DIAGNOSIS — I10 Essential (primary) hypertension: Secondary | ICD-10-CM | POA: Diagnosis not present

## 2018-05-20 ENCOUNTER — Ambulatory Visit (INDEPENDENT_AMBULATORY_CARE_PROVIDER_SITE_OTHER): Payer: BLUE CROSS/BLUE SHIELD | Admitting: Psychiatry

## 2018-05-20 DIAGNOSIS — F509 Eating disorder, unspecified: Secondary | ICD-10-CM | POA: Diagnosis not present

## 2018-05-20 DIAGNOSIS — F4324 Adjustment disorder with disturbance of conduct: Secondary | ICD-10-CM

## 2018-05-23 DIAGNOSIS — Z79899 Other long term (current) drug therapy: Secondary | ICD-10-CM | POA: Diagnosis not present

## 2018-05-23 DIAGNOSIS — K219 Gastro-esophageal reflux disease without esophagitis: Secondary | ICD-10-CM | POA: Diagnosis not present

## 2018-05-23 DIAGNOSIS — Z5181 Encounter for therapeutic drug level monitoring: Secondary | ICD-10-CM | POA: Diagnosis not present

## 2018-05-23 DIAGNOSIS — E119 Type 2 diabetes mellitus without complications: Secondary | ICD-10-CM | POA: Diagnosis not present

## 2018-05-23 DIAGNOSIS — F900 Attention-deficit hyperactivity disorder, predominantly inattentive type: Secondary | ICD-10-CM | POA: Diagnosis not present

## 2018-05-23 DIAGNOSIS — I10 Essential (primary) hypertension: Secondary | ICD-10-CM | POA: Diagnosis not present

## 2018-06-03 ENCOUNTER — Ambulatory Visit (INDEPENDENT_AMBULATORY_CARE_PROVIDER_SITE_OTHER): Payer: BLUE CROSS/BLUE SHIELD | Admitting: Psychiatry

## 2018-06-03 DIAGNOSIS — F4324 Adjustment disorder with disturbance of conduct: Secondary | ICD-10-CM

## 2018-06-03 DIAGNOSIS — F509 Eating disorder, unspecified: Secondary | ICD-10-CM | POA: Diagnosis not present

## 2018-06-17 ENCOUNTER — Ambulatory Visit: Payer: Self-pay | Admitting: Psychiatry

## 2018-07-01 ENCOUNTER — Ambulatory Visit (INDEPENDENT_AMBULATORY_CARE_PROVIDER_SITE_OTHER): Payer: BLUE CROSS/BLUE SHIELD | Admitting: Psychiatry

## 2018-07-01 DIAGNOSIS — F4324 Adjustment disorder with disturbance of conduct: Secondary | ICD-10-CM | POA: Diagnosis not present

## 2018-07-15 ENCOUNTER — Ambulatory Visit (INDEPENDENT_AMBULATORY_CARE_PROVIDER_SITE_OTHER): Payer: BLUE CROSS/BLUE SHIELD | Admitting: Psychiatry

## 2018-07-15 DIAGNOSIS — F4324 Adjustment disorder with disturbance of conduct: Secondary | ICD-10-CM

## 2018-07-29 ENCOUNTER — Ambulatory Visit (INDEPENDENT_AMBULATORY_CARE_PROVIDER_SITE_OTHER): Payer: BLUE CROSS/BLUE SHIELD | Admitting: Psychiatry

## 2018-07-29 DIAGNOSIS — F509 Eating disorder, unspecified: Secondary | ICD-10-CM | POA: Diagnosis not present

## 2018-07-29 DIAGNOSIS — F4324 Adjustment disorder with disturbance of conduct: Secondary | ICD-10-CM

## 2018-08-12 ENCOUNTER — Ambulatory Visit (INDEPENDENT_AMBULATORY_CARE_PROVIDER_SITE_OTHER): Payer: BLUE CROSS/BLUE SHIELD | Admitting: Psychiatry

## 2018-08-12 DIAGNOSIS — F509 Eating disorder, unspecified: Secondary | ICD-10-CM | POA: Diagnosis not present

## 2018-08-12 DIAGNOSIS — F4324 Adjustment disorder with disturbance of conduct: Secondary | ICD-10-CM

## 2018-08-26 ENCOUNTER — Ambulatory Visit: Payer: BLUE CROSS/BLUE SHIELD | Admitting: Psychiatry

## 2018-09-04 DIAGNOSIS — R1013 Epigastric pain: Secondary | ICD-10-CM | POA: Diagnosis not present

## 2018-09-04 DIAGNOSIS — R1011 Right upper quadrant pain: Secondary | ICD-10-CM | POA: Diagnosis not present

## 2018-09-09 ENCOUNTER — Ambulatory Visit: Payer: Self-pay | Admitting: Psychiatry

## 2018-09-23 ENCOUNTER — Ambulatory Visit (INDEPENDENT_AMBULATORY_CARE_PROVIDER_SITE_OTHER): Payer: BLUE CROSS/BLUE SHIELD | Admitting: Psychiatry

## 2018-09-23 DIAGNOSIS — F509 Eating disorder, unspecified: Secondary | ICD-10-CM

## 2018-09-23 DIAGNOSIS — F4324 Adjustment disorder with disturbance of conduct: Secondary | ICD-10-CM

## 2018-10-07 ENCOUNTER — Ambulatory Visit: Payer: BLUE CROSS/BLUE SHIELD | Admitting: Psychiatry

## 2018-10-31 ENCOUNTER — Ambulatory Visit (INDEPENDENT_AMBULATORY_CARE_PROVIDER_SITE_OTHER): Payer: BLUE CROSS/BLUE SHIELD | Admitting: Psychology

## 2018-10-31 DIAGNOSIS — F3289 Other specified depressive episodes: Secondary | ICD-10-CM | POA: Diagnosis not present

## 2018-11-04 ENCOUNTER — Ambulatory Visit: Payer: BLUE CROSS/BLUE SHIELD | Admitting: Psychiatry

## 2018-11-06 ENCOUNTER — Ambulatory Visit (INDEPENDENT_AMBULATORY_CARE_PROVIDER_SITE_OTHER): Payer: BLUE CROSS/BLUE SHIELD | Admitting: Psychology

## 2018-11-06 DIAGNOSIS — F3289 Other specified depressive episodes: Secondary | ICD-10-CM

## 2018-11-18 ENCOUNTER — Ambulatory Visit: Payer: BLUE CROSS/BLUE SHIELD | Admitting: Psychiatry

## 2018-11-20 ENCOUNTER — Ambulatory Visit (INDEPENDENT_AMBULATORY_CARE_PROVIDER_SITE_OTHER): Payer: BLUE CROSS/BLUE SHIELD | Admitting: Psychology

## 2018-11-20 DIAGNOSIS — F3289 Other specified depressive episodes: Secondary | ICD-10-CM | POA: Diagnosis not present

## 2018-11-25 DIAGNOSIS — I10 Essential (primary) hypertension: Secondary | ICD-10-CM | POA: Diagnosis not present

## 2018-11-25 DIAGNOSIS — E785 Hyperlipidemia, unspecified: Secondary | ICD-10-CM | POA: Diagnosis not present

## 2018-11-25 DIAGNOSIS — E119 Type 2 diabetes mellitus without complications: Secondary | ICD-10-CM | POA: Diagnosis not present

## 2018-12-02 ENCOUNTER — Ambulatory Visit: Payer: BLUE CROSS/BLUE SHIELD | Admitting: Psychiatry

## 2019-03-29 DIAGNOSIS — N390 Urinary tract infection, site not specified: Secondary | ICD-10-CM | POA: Diagnosis not present

## 2019-04-06 DIAGNOSIS — N898 Other specified noninflammatory disorders of vagina: Secondary | ICD-10-CM | POA: Diagnosis not present

## 2019-04-06 DIAGNOSIS — R300 Dysuria: Secondary | ICD-10-CM | POA: Diagnosis not present

## 2019-06-01 DIAGNOSIS — Z114 Encounter for screening for human immunodeficiency virus [HIV]: Secondary | ICD-10-CM | POA: Diagnosis not present

## 2019-06-01 DIAGNOSIS — Z01419 Encounter for gynecological examination (general) (routine) without abnormal findings: Secondary | ICD-10-CM | POA: Diagnosis not present

## 2019-06-01 DIAGNOSIS — Z124 Encounter for screening for malignant neoplasm of cervix: Secondary | ICD-10-CM | POA: Diagnosis not present

## 2019-06-01 DIAGNOSIS — R87810 Cervical high risk human papillomavirus (HPV) DNA test positive: Secondary | ICD-10-CM | POA: Diagnosis not present

## 2019-06-01 DIAGNOSIS — Z113 Encounter for screening for infections with a predominantly sexual mode of transmission: Secondary | ICD-10-CM | POA: Diagnosis not present

## 2019-06-01 DIAGNOSIS — Z6841 Body Mass Index (BMI) 40.0 and over, adult: Secondary | ICD-10-CM | POA: Diagnosis not present

## 2019-06-01 DIAGNOSIS — Z1159 Encounter for screening for other viral diseases: Secondary | ICD-10-CM | POA: Diagnosis not present

## 2019-06-01 DIAGNOSIS — Z1151 Encounter for screening for human papillomavirus (HPV): Secondary | ICD-10-CM | POA: Diagnosis not present

## 2019-06-01 DIAGNOSIS — Z118 Encounter for screening for other infectious and parasitic diseases: Secondary | ICD-10-CM | POA: Diagnosis not present

## 2019-07-06 ENCOUNTER — Emergency Department (HOSPITAL_COMMUNITY)
Admission: EM | Admit: 2019-07-06 | Discharge: 2019-07-06 | Disposition: A | Discharge status: Home / Self Care | Payer: BC Managed Care – PPO | Attending: Emergency Medicine | Admitting: Emergency Medicine

## 2019-07-06 ENCOUNTER — Other Ambulatory Visit: Payer: Self-pay

## 2019-07-06 ENCOUNTER — Emergency Department (HOSPITAL_COMMUNITY): Payer: BC Managed Care – PPO

## 2019-07-06 ENCOUNTER — Encounter (HOSPITAL_COMMUNITY): Payer: Self-pay | Admitting: Emergency Medicine

## 2019-07-06 DIAGNOSIS — I110 Hypertensive heart disease with heart failure: Secondary | ICD-10-CM | POA: Diagnosis not present

## 2019-07-06 DIAGNOSIS — Z87891 Personal history of nicotine dependence: Secondary | ICD-10-CM | POA: Diagnosis not present

## 2019-07-06 DIAGNOSIS — M549 Dorsalgia, unspecified: Secondary | ICD-10-CM

## 2019-07-06 DIAGNOSIS — M7918 Myalgia, other site: Secondary | ICD-10-CM | POA: Insufficient documentation

## 2019-07-06 DIAGNOSIS — Z79899 Other long term (current) drug therapy: Secondary | ICD-10-CM | POA: Insufficient documentation

## 2019-07-06 DIAGNOSIS — E119 Type 2 diabetes mellitus without complications: Secondary | ICD-10-CM | POA: Diagnosis not present

## 2019-07-06 DIAGNOSIS — R109 Unspecified abdominal pain: Secondary | ICD-10-CM | POA: Insufficient documentation

## 2019-07-06 DIAGNOSIS — I5022 Chronic systolic (congestive) heart failure: Secondary | ICD-10-CM | POA: Insufficient documentation

## 2019-07-06 LAB — CBC WITH DIFFERENTIAL/PLATELET
Abs Immature Granulocytes: 0.04 10*3/uL (ref 0.00–0.07)
Basophils Absolute: 0 10*3/uL (ref 0.0–0.1)
Basophils Relative: 0 %
Eosinophils Absolute: 0 10*3/uL (ref 0.0–0.5)
Eosinophils Relative: 0 %
HCT: 41.8 % (ref 36.0–46.0)
Hemoglobin: 13 g/dL (ref 12.0–15.0)
Immature Granulocytes: 0 %
Lymphocytes Relative: 16 %
Lymphs Abs: 1.7 10*3/uL (ref 0.7–4.0)
MCH: 25.7 pg — ABNORMAL LOW (ref 26.0–34.0)
MCHC: 31.1 g/dL (ref 30.0–36.0)
MCV: 82.8 fL (ref 80.0–100.0)
Monocytes Absolute: 0.6 10*3/uL (ref 0.1–1.0)
Monocytes Relative: 6 %
Neutro Abs: 8.3 10*3/uL — ABNORMAL HIGH (ref 1.7–7.7)
Neutrophils Relative %: 78 %
Platelets: 400 10*3/uL (ref 150–400)
RBC: 5.05 MIL/uL (ref 3.87–5.11)
RDW: 16 % — ABNORMAL HIGH (ref 11.5–15.5)
WBC: 10.7 10*3/uL — ABNORMAL HIGH (ref 4.0–10.5)
nRBC: 0 % (ref 0.0–0.2)

## 2019-07-06 LAB — URINALYSIS, ROUTINE W REFLEX MICROSCOPIC
Bilirubin Urine: NEGATIVE
Glucose, UA: NEGATIVE mg/dL
Ketones, ur: NEGATIVE mg/dL
Leukocytes,Ua: NEGATIVE
Nitrite: NEGATIVE
Protein, ur: NEGATIVE mg/dL
Specific Gravity, Urine: 1.004 — ABNORMAL LOW (ref 1.005–1.030)
pH: 7 (ref 5.0–8.0)

## 2019-07-06 LAB — BASIC METABOLIC PANEL
Anion gap: 16 — ABNORMAL HIGH (ref 5–15)
BUN: 10 mg/dL (ref 6–20)
CO2: 23 mmol/L (ref 22–32)
Calcium: 9 mg/dL (ref 8.9–10.3)
Chloride: 96 mmol/L — ABNORMAL LOW (ref 98–111)
Creatinine, Ser: 0.78 mg/dL (ref 0.44–1.00)
GFR calc Af Amer: >60 mL/min (ref >60)
GFR calc non Af Amer: >60 mL/min (ref >60)
Glucose, Bld: 135 mg/dL — ABNORMAL HIGH (ref 70–99)
Potassium: 3.2 mmol/L — ABNORMAL LOW (ref 3.5–5.1)
Sodium: 135 mmol/L (ref 135–145)

## 2019-07-06 LAB — PREGNANCY, URINE: Preg Test, Ur: NEGATIVE

## 2019-07-06 MED ORDER — HYDROCODONE-ACETAMINOPHEN 5-325 MG PO TABS: 1.0000 | ORAL_TABLET | Freq: Four times a day (QID) | ORAL | 0 refills | Status: DC | PRN

## 2019-07-06 MED ORDER — HYDROMORPHONE HCL 1 MG/ML IJ SOLN
0.5000 mg | Freq: Once | INTRAMUSCULAR | Status: AC
  Administered 2019-07-06: 0.5 mg via INTRAVENOUS
  Filled 2019-07-06: qty 1

## 2019-07-06 MED ORDER — HYDROMORPHONE HCL 1 MG/ML IJ SOLN
1.0000 mg | Freq: Once | INTRAMUSCULAR | Status: AC
  Administered 2019-07-06: 1 mg via INTRAVENOUS
  Filled 2019-07-06: qty 1

## 2019-07-06 MED ORDER — METHOCARBAMOL 500 MG PO TABS: 500.0000 mg | ORAL_TABLET | Freq: Two times a day (BID) | ORAL | 0 refills | Status: DC

## 2019-07-06 MED ORDER — IBUPROFEN 600 MG PO TABS: 600.0000 mg | ORAL_TABLET | Freq: Four times a day (QID) | ORAL | 0 refills | Status: DC | PRN

## 2019-07-06 MED ORDER — ONDANSETRON HCL 4 MG/2ML IJ SOLN
4.0000 mg | Freq: Once | INTRAMUSCULAR | Status: AC
  Administered 2019-07-06: 02:00:00 4 mg via INTRAVENOUS
  Filled 2019-07-06: qty 2

## 2019-07-06 NOTE — ED Notes (Signed)
Lab will add on urine preg 

## 2019-07-06 NOTE — ED Triage Notes (Signed)
PT reports sudden onset of left sided flank pain with pain on any movement. Pt denies any radiation to leg.

## 2019-07-06 NOTE — ED Provider Notes (Signed)
[facility]-EMERGENCY DEPT Provider Note   CSN: [id number] Arrival date & time: 07/06/19  0105     History Chief Complaint  Patient presents with  . Flank Pain    Kathryn [name] is a 38 y.o. female.  Patient to ED with sudden onset left flank pain tonight while at home. No fever, urinary symptoms, injury. She denies history of kidney stones. She has nausea without vomiting. She states it is worse with any movement. No diarrhea. The pain does not radiate from the flank area. No chest pain, SOB, cough or abdominal pain.  The history is provided by the patient. No language interpreter was used.  Flank Pain Pertinent negatives include no chest pain, no abdominal pain and no shortness of breath.       Past Medical History:  Diagnosis Date  . Anemia    history only - no current problem  . CHF (congestive heart failure) (HCC)    ECHO cardiogram schedule 07/11/11  . Diabetes mellitus    no med - last A1C was 5.9  on 04/2011  . Hyperlipidemia   . Hypertension   . Sleep apnea    does not use CPAP  . UTI (lower urinary tract infection)   . Vitamin D deficiency     Patient Active Problem List   Diagnosis Date Noted  . DM type 2 (diabetes mellitus, type 2) (HCC) 04/16/2012  . Chronic systolic CHF (congestive heart failure) (HCC) 04/16/2012  . Abnormal ultrasound of kidney 04/16/2012  . Pancreatitis, acute 04/15/2012  . SCARLET FEVER 11/22/2008  . PULMONARY HYPERTENSION 11/22/2008  . SYSTOLIC HEART FAILURE, ACUTE ON CHRONIC 11/22/2008  . UNSPECIFIED TACHYCARDIA 11/22/2008  . DM 11/20/2008  . Morbid obesity (HCC) 11/20/2008  . HYPERTENSION 11/20/2008  . CARDIOMEGALY 11/20/2008  . SLEEP APNEA 11/20/2008  . LEG EDEMA 11/20/2008    Past Surgical History:  Procedure Laterality Date  . GASTRIC BYPASS  01/15/2011  . UPPER GASTROINTESTINAL ENDOSCOPY    . VULVAR LESION REMOVAL  07/13/2011   Procedure: VULVAR LESION;  Surgeon: Serita KyleSheronette A  Cousins, MD;  Location: [facility];  Service: Gynecology;  Laterality: Right;  Excision of right vulvar mass.     OB History   No obstetric history on file.     History reviewed. No pertinent family history.  Social History   Tobacco Use  . Smoking status: Former Smoker    Packs/day: 0.25    Years: 1.00    Pack years: 0.25    Types: Cigarettes    Quit date: 12/14/2000    Years since quitting: 18.5  . Smokeless tobacco: Never Used  Substance Use Topics  . Alcohol use: Yes    Comment: Rarely - twice a year  . Drug use: No    Home Medications Prior to Admission medications   Medication Sig Start Date End Date Taking? Authorizing Provider  acetaminophen (TYLENOL) 500 MG tablet Take 1,000 mg by mouth every 6 (six) hours as needed for mild pain.    [provider]  carvedilol (COREG) 12.5 MG tablet Take 12.5 mg by mouth 2 (two) times daily with a meal. Pt only taking once a day even though prescribed for two.    [provider]  cholecalciferol (VITAMIN D) 1000 UNITS tablet Take 5,000 Units by mouth daily.     [provider]  dicyclomine (BENTYL) 20 MG tablet Take 1 tablet (20 mg total) by mouth 3 (three) times daily before meals. 03/31/15   Earley FavorSchulz, Gail,  NP  lisinopril-hydrochlorothiazide (PRINZIDE,ZESTORETIC) 20-12.5 MG per tablet Take 1 tablet by mouth 2 (two) times daily.      [provider]  Multiple Vitamin (MULTIVITAMIN WITH MINERALS) TABS tablet Take 1 tablet by mouth daily.    [provider]  norgestimate-ethinyl estradiol (ORTHO-CYCLEN,SPRINTEC,PREVIFEM) 0.25-35 MG-MCG tablet Take 1 tablet by mouth daily.    [provider]  ondansetron (ZOFRAN) 4 MG tablet Take 1 tablet (4 mg total) by mouth every 8 (eight) hours as needed for nausea. Patient not taking: Reported on 03/31/2015 08/18/12   Francine Graven, DO  ranitidine (ZANTAC) 150 MG tablet Take 150 mg by mouth 2 (two) times daily.     [provider]    valACYclovir (VALTREX) 500 MG tablet Take 500 mg by mouth daily.    [provider]  VYVANSE 30 MG capsule Take 1 capsule by mouth daily. 02/27/15   [provider]    Allergies    Floxin [ofloxacin] and Sulfa antibiotics  Review of Systems   Review of Systems  Constitutional: Negative for chills and fever.  HENT: Negative.   Respiratory: Negative.  Negative for shortness of breath.   Cardiovascular: Negative.  Negative for chest pain.  Gastrointestinal: Positive for nausea. Negative for abdominal pain and vomiting.  Genitourinary: Positive for flank pain. Negative for dysuria and hematuria.  Skin: Negative.   Neurological: Negative.     Physical Exam Updated Vital Signs BP 137/88   Pulse 91   Temp 98 F (36.7 C) (Oral)   Resp 16   Ht 5\' 6"  (1.676 m)   Wt (!) 141.5 kg   LMP 06/29/2019   SpO2 100%   BMI 50.36 kg/m   Physical Exam Vitals and nursing note reviewed.  Constitutional:      Appearance: She is well-developed. She is obese.  HENT:     Head: Normocephalic.  Cardiovascular:     Rate and Rhythm: Normal rate and regular rhythm.  Pulmonary:     Effort: Pulmonary effort is normal.     Breath sounds: Normal breath sounds. No wheezing, rhonchi or rales.  Abdominal:     General: Bowel sounds are normal.     Palpations: Abdomen is soft.     Tenderness: There is no abdominal tenderness. There is no guarding or rebound.  Musculoskeletal:        General: Normal range of motion.     Cervical back: Normal range of motion and neck supple.       Back:     Comments: Tender to palpation left flank area  Skin:    General: Skin is warm and dry.  Neurological:     General: No focal deficit present.     Mental Status: She is alert and oriented to person, place, and time.     ED Results / Procedures / Treatments   Labs (all labs ordered are listed, but only abnormal results are displayed) Labs Reviewed  CBC WITH DIFFERENTIAL/PLATELET - Abnormal;  Notable for the following components:      Result Value   WBC 10.7 (*)    MCH 25.7 (*)    RDW 16.0 (*)    Neutro Abs 8.3 (*)    All other components within normal limits  BASIC METABOLIC PANEL - Abnormal; Notable for the following components:   Potassium 3.2 (*)    Chloride 96 (*)    Glucose, Bld 135 (*)    Anion gap 16 (*)    All other components within normal limits  URINALYSIS, ROUTINE W REFLEX MICROSCOPIC - Abnormal; Notable for the following components:   Color, Urine STRAW (*)    Specific Gravity, Urine 1.004 (*)    Hgb urine dipstick LARGE (*)    Bacteria, UA RARE (*)    All other components within normal limits  PREGNANCY, URINE    EKG None  Radiology CT Renal Stone Study  Result Date: 07/06/2019 CLINICAL DATA:  Sudden onset of left flank pain. EXAM: CT ABDOMEN AND PELVIS WITHOUT CONTRAST TECHNIQUE: Multidetector CT imaging of the abdomen and pelvis was performed following the standard protocol without IV contrast. COMPARISON:  03/31/2015 FINDINGS: Lower chest: Minor atelectasis in the lower lobes. No pleural fluid. Small hiatal hernia. Hepatobiliary: Liver is enlarged spanning 21.4 cm cranial caudal. Mild hepatic steatosis. No focal liver lesion. Layering stones in the gallbladder without pericholecystic inflammation. No biliary dilatation. Pancreas: No ductal dilatation or inflammation. Spleen: Normal in size without focal abnormality. Adrenals/Urinary Tract: Normal adrenal glands. Chronic moderate right hydronephrosis with thinning of the renal parenchyma. Transition at the ureteropelvic junction. Findings consistent with chronic UPJ obstruction. Chronic left extrarenal pelvis versus mild hydronephrosis. There are no renal or ureteral calculi. No perinephric edema. Urinary bladder is physiologically distended. No bladder wall thickening. Stomach/Bowel: Small hiatal hernia. Gastric staple line about the greater curvature, reported history of gastric bypass. No small bowel wall  thickening, inflammatory change, or obstruction. Normal appendix. Small volume of stool throughout the colon. No colonic wall thickening or inflammation. Minimal distal descending and sigmoid colonic diverticulosis without diverticulitis. Vascular/Lymphatic: Mild aortic atherosclerosis. No aneurysm. No enlarged lymph nodes in the abdomen or pelvis. Reproductive: Uterus and bilateral adnexa are unremarkable. Ovaries are symmetric and normal in size. Other: No free air, free fluid, or intra-abdominal fluid collection. Musculoskeletal: Mild scoliosis and degenerative change in the spine. There are no acute or suspicious osseous abnormalities. IMPRESSION: 1. No acute abnormality or explanation for left flank pain. 2. 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 Chronic left extrarenal pelvis versus mild hydronephrosis. 3. Hepatomegaly and hepatic steatosis. Cholelithiasis without gallbladder inflammation. 4. Minimal colonic diverticulosis without diverticulitis. Aortic Atherosclerosis (ICD10-I70.0). Electronically Signed   By: Narda Rutherford M.D.   On: 07/06/2019 04:09    Procedures Procedures (including critical care time)  Medications Ordered in ED Medications  HYDROmorphone (DILAUDID) injection 1 mg (1 mg Intravenous Given 07/06/19 0216)  ondansetron (ZOFRAN) injection 4 mg (4 mg Intravenous Given 07/06/19 0216)    ED Course  I have reviewed the triage vital signs and the nursing notes.  Pertinent labs & imaging results that were available during my care of the patient were reviewed by me and considered in my medical decision making (see chart for details).    MDM Rules/Calculators/A&P                      Patient to ED with sudden onset flank pain as detailed in HPI.  The patient appears very uncomfortable on arrival. Nontoxic, no vomiting. Flank on left is tender to palpation.   Labs are essentially unremarkable. No evidence UTI, hematuria. No leukocytosis. VSS,  afebrile.   CT renal study does not show any ureteral stones. Obstructive abnormality on right seen on previous studies again seen without change.   On re-evaluation, patient pain and nausea are resolved with single dose IV medications.   No fever, no evidence stone or urinary infection, no leukocytosis to suggest acute process. Pain is worse with movement so likely musculoskeletal in nature.  She is felt appropriate for discharge home.  Final Clinical Impression(s) / ED Diagnoses Final diagnoses:  None   1. Musculoskeletal pain  Rx / DC Orders ED Discharge Orders    None       Elpidio Anis, PA-C 07/06/19 0528    Ward, Layla Maw, DO 07/06/19 [phone]

## 2019-07-06 NOTE — ED Notes (Signed)
Pt ambulated to restroom with a steady, slow gait.

## 2019-07-07 DIAGNOSIS — N920 Excessive and frequent menstruation with regular cycle: Secondary | ICD-10-CM | POA: Diagnosis not present

## 2019-08-21 DIAGNOSIS — Z20828 Contact with and (suspected) exposure to other viral communicable diseases: Secondary | ICD-10-CM | POA: Diagnosis not present

## 2019-09-30 DIAGNOSIS — I10 Essential (primary) hypertension: Secondary | ICD-10-CM | POA: Diagnosis not present

## 2019-09-30 DIAGNOSIS — E785 Hyperlipidemia, unspecified: Secondary | ICD-10-CM | POA: Diagnosis not present

## 2019-09-30 DIAGNOSIS — E119 Type 2 diabetes mellitus without complications: Secondary | ICD-10-CM | POA: Diagnosis not present

## 2019-09-30 DIAGNOSIS — E0590 Thyrotoxicosis, unspecified without thyrotoxic crisis or storm: Secondary | ICD-10-CM | POA: Diagnosis not present

## 2019-09-30 DIAGNOSIS — E559 Vitamin D deficiency, unspecified: Secondary | ICD-10-CM | POA: Diagnosis not present

## 2019-10-02 ENCOUNTER — Ambulatory Visit: Payer: BC Managed Care – PPO | Attending: Internal Medicine

## 2019-10-02 DIAGNOSIS — Z23 Encounter for immunization: Secondary | ICD-10-CM

## 2019-10-02 NOTE — Progress Notes (Signed)
   Covid-19 Vaccination Clinic  Name:  Kathryn [name]    MRN: [id number] DOB: 1981/03/20  10/02/2019  [name] was observed post Covid-19 immunization for 15 minutes without incident. She was provided with Vaccine Information Sheet and instruction to access the V-Safe system.   [name] was instructed to call 911 with any severe reactions post vaccine: Marland Kitchen Difficulty breathing  . Swelling of face and throat  . A fast heartbeat  . A bad rash all over body  . Dizziness and weakness   Immunizations Administered    Name Date Dose VIS Date Route   Pfizer COVID-19 Vaccine 10/02/2019  2:29 PM 0.3 mL 06/26/2019 Intramuscular   Manufacturer: [employer], [employer]   Lot: [id number]   NDC: [address]-2

## 2019-10-06 DIAGNOSIS — E119 Type 2 diabetes mellitus without complications: Secondary | ICD-10-CM | POA: Diagnosis not present

## 2019-10-06 DIAGNOSIS — Z0000 Encounter for general adult medical examination without abnormal findings: Secondary | ICD-10-CM | POA: Diagnosis not present

## 2019-10-06 DIAGNOSIS — K219 Gastro-esophageal reflux disease without esophagitis: Secondary | ICD-10-CM | POA: Diagnosis not present

## 2019-10-06 DIAGNOSIS — I10 Essential (primary) hypertension: Secondary | ICD-10-CM | POA: Diagnosis not present

## 2019-10-27 ENCOUNTER — Ambulatory Visit: Payer: BC Managed Care – PPO | Attending: Internal Medicine

## 2019-10-27 DIAGNOSIS — Z23 Encounter for immunization: Secondary | ICD-10-CM

## 2019-10-27 NOTE — Progress Notes (Signed)
   Covid-19 Vaccination Clinic  Name:  Kathryn [name]    MRN: [id number] DOB: May 27, 1981  10/27/2019  Kathryn [name] was observed post Covid-19 immunization for 15 minutes without incident. She was provided with Vaccine Information Sheet and instruction to access the V-Safe system.   Kathryn [name] was instructed to call 911 with any severe reactions post vaccine: Marland Kitchen Difficulty breathing  . Swelling of face and throat  . A fast heartbeat  . A bad rash all over body  . Dizziness and weakness   Immunizations Administered    Name Date Dose VIS Date Route   Pfizer COVID-19 Vaccine 10/27/2019  3:46 PM 0.3 mL 06/26/2019 Intramuscular   Manufacturer: [employer], [employer]   Lot: [id number]   NDC: [address]-1

## 2020-04-01 DIAGNOSIS — D649 Anemia, unspecified: Secondary | ICD-10-CM | POA: Diagnosis not present

## 2020-04-01 DIAGNOSIS — E119 Type 2 diabetes mellitus without complications: Secondary | ICD-10-CM | POA: Diagnosis not present

## 2020-04-08 DIAGNOSIS — Z9884 Bariatric surgery status: Secondary | ICD-10-CM | POA: Diagnosis not present

## 2020-04-08 DIAGNOSIS — Z23 Encounter for immunization: Secondary | ICD-10-CM | POA: Diagnosis not present

## 2020-04-08 DIAGNOSIS — Z0000 Encounter for general adult medical examination without abnormal findings: Secondary | ICD-10-CM | POA: Diagnosis not present

## 2020-04-08 DIAGNOSIS — F900 Attention-deficit hyperactivity disorder, predominantly inattentive type: Secondary | ICD-10-CM | POA: Diagnosis not present

## 2020-11-02 DIAGNOSIS — E0590 Thyrotoxicosis, unspecified without thyrotoxic crisis or storm: Secondary | ICD-10-CM | POA: Diagnosis not present

## 2020-11-02 DIAGNOSIS — F900 Attention-deficit hyperactivity disorder, predominantly inattentive type: Secondary | ICD-10-CM | POA: Diagnosis not present

## 2020-11-02 DIAGNOSIS — D649 Anemia, unspecified: Secondary | ICD-10-CM | POA: Diagnosis not present

## 2020-11-02 DIAGNOSIS — K219 Gastro-esophageal reflux disease without esophagitis: Secondary | ICD-10-CM | POA: Diagnosis not present

## 2020-11-02 DIAGNOSIS — E119 Type 2 diabetes mellitus without complications: Secondary | ICD-10-CM | POA: Diagnosis not present

## 2020-11-02 DIAGNOSIS — Z79899 Other long term (current) drug therapy: Secondary | ICD-10-CM | POA: Diagnosis not present

## 2020-11-09 DIAGNOSIS — E785 Hyperlipidemia, unspecified: Secondary | ICD-10-CM | POA: Diagnosis not present

## 2020-11-09 DIAGNOSIS — E119 Type 2 diabetes mellitus without complications: Secondary | ICD-10-CM | POA: Diagnosis not present

## 2020-11-09 DIAGNOSIS — I10 Essential (primary) hypertension: Secondary | ICD-10-CM | POA: Diagnosis not present

## 2020-11-09 DIAGNOSIS — I5032 Chronic diastolic (congestive) heart failure: Secondary | ICD-10-CM | POA: Diagnosis not present

## 2020-12-15 DIAGNOSIS — K219 Gastro-esophageal reflux disease without esophagitis: Secondary | ICD-10-CM | POA: Diagnosis not present

## 2020-12-15 DIAGNOSIS — E119 Type 2 diabetes mellitus without complications: Secondary | ICD-10-CM | POA: Diagnosis not present

## 2021-06-16 DIAGNOSIS — Z79899 Other long term (current) drug therapy: Secondary | ICD-10-CM | POA: Diagnosis not present

## 2021-06-16 DIAGNOSIS — R7989 Other specified abnormal findings of blood chemistry: Secondary | ICD-10-CM | POA: Diagnosis not present

## 2021-06-16 DIAGNOSIS — E119 Type 2 diabetes mellitus without complications: Secondary | ICD-10-CM | POA: Diagnosis not present

## 2021-06-16 DIAGNOSIS — E785 Hyperlipidemia, unspecified: Secondary | ICD-10-CM | POA: Diagnosis not present

## 2021-06-16 DIAGNOSIS — E559 Vitamin D deficiency, unspecified: Secondary | ICD-10-CM | POA: Diagnosis not present

## 2021-06-22 DIAGNOSIS — E559 Vitamin D deficiency, unspecified: Secondary | ICD-10-CM | POA: Diagnosis not present

## 2021-06-22 DIAGNOSIS — Z79899 Other long term (current) drug therapy: Secondary | ICD-10-CM | POA: Diagnosis not present

## 2021-06-22 DIAGNOSIS — Z23 Encounter for immunization: Secondary | ICD-10-CM | POA: Diagnosis not present

## 2021-06-22 DIAGNOSIS — Z0000 Encounter for general adult medical examination without abnormal findings: Secondary | ICD-10-CM | POA: Diagnosis not present

## 2022-01-15 DIAGNOSIS — E785 Hyperlipidemia, unspecified: Secondary | ICD-10-CM | POA: Diagnosis not present

## 2022-01-15 DIAGNOSIS — D649 Anemia, unspecified: Secondary | ICD-10-CM | POA: Diagnosis not present

## 2022-01-15 DIAGNOSIS — E119 Type 2 diabetes mellitus without complications: Secondary | ICD-10-CM | POA: Diagnosis not present

## 2022-01-15 DIAGNOSIS — Z9884 Bariatric surgery status: Secondary | ICD-10-CM | POA: Diagnosis not present

## 2022-01-15 DIAGNOSIS — Z79899 Other long term (current) drug therapy: Secondary | ICD-10-CM | POA: Diagnosis not present

## 2022-01-19 DIAGNOSIS — E538 Deficiency of other specified B group vitamins: Secondary | ICD-10-CM | POA: Diagnosis not present

## 2022-01-19 DIAGNOSIS — E785 Hyperlipidemia, unspecified: Secondary | ICD-10-CM | POA: Diagnosis not present

## 2022-01-19 DIAGNOSIS — D649 Anemia, unspecified: Secondary | ICD-10-CM | POA: Diagnosis not present

## 2022-01-19 DIAGNOSIS — E119 Type 2 diabetes mellitus without complications: Secondary | ICD-10-CM | POA: Diagnosis not present

## 2022-01-19 DIAGNOSIS — Z79899 Other long term (current) drug therapy: Secondary | ICD-10-CM | POA: Diagnosis not present

## 2022-04-20 DIAGNOSIS — E538 Deficiency of other specified B group vitamins: Secondary | ICD-10-CM | POA: Diagnosis not present

## 2022-04-20 DIAGNOSIS — D649 Anemia, unspecified: Secondary | ICD-10-CM | POA: Diagnosis not present

## 2022-04-20 DIAGNOSIS — Z9884 Bariatric surgery status: Secondary | ICD-10-CM | POA: Diagnosis not present

## 2022-09-19 DIAGNOSIS — D649 Anemia, unspecified: Secondary | ICD-10-CM | POA: Diagnosis not present

## 2022-09-19 DIAGNOSIS — E119 Type 2 diabetes mellitus without complications: Secondary | ICD-10-CM | POA: Diagnosis not present

## 2022-09-19 DIAGNOSIS — Z79899 Other long term (current) drug therapy: Secondary | ICD-10-CM | POA: Diagnosis not present

## 2022-09-19 DIAGNOSIS — E785 Hyperlipidemia, unspecified: Secondary | ICD-10-CM | POA: Diagnosis not present

## 2022-09-19 DIAGNOSIS — Z9884 Bariatric surgery status: Secondary | ICD-10-CM | POA: Diagnosis not present

## 2022-10-04 DIAGNOSIS — Z0000 Encounter for general adult medical examination without abnormal findings: Secondary | ICD-10-CM | POA: Diagnosis not present

## 2022-10-04 DIAGNOSIS — K219 Gastro-esophageal reflux disease without esophagitis: Secondary | ICD-10-CM | POA: Diagnosis not present

## 2022-10-04 DIAGNOSIS — E119 Type 2 diabetes mellitus without complications: Secondary | ICD-10-CM | POA: Diagnosis not present

## 2023-04-03 DIAGNOSIS — I10 Essential (primary) hypertension: Secondary | ICD-10-CM | POA: Diagnosis not present

## 2023-04-03 DIAGNOSIS — E119 Type 2 diabetes mellitus without complications: Secondary | ICD-10-CM | POA: Diagnosis not present

## 2023-04-03 DIAGNOSIS — K219 Gastro-esophageal reflux disease without esophagitis: Secondary | ICD-10-CM | POA: Diagnosis not present

## 2023-04-10 DIAGNOSIS — F900 Attention-deficit hyperactivity disorder, predominantly inattentive type: Secondary | ICD-10-CM | POA: Diagnosis not present

## 2023-04-10 DIAGNOSIS — Z23 Encounter for immunization: Secondary | ICD-10-CM | POA: Diagnosis not present

## 2023-04-10 DIAGNOSIS — E119 Type 2 diabetes mellitus without complications: Secondary | ICD-10-CM | POA: Diagnosis not present

## 2023-04-10 DIAGNOSIS — Z0000 Encounter for general adult medical examination without abnormal findings: Secondary | ICD-10-CM | POA: Diagnosis not present

## 2023-10-25 DIAGNOSIS — Z79899 Other long term (current) drug therapy: Secondary | ICD-10-CM | POA: Diagnosis not present

## 2023-10-25 DIAGNOSIS — E538 Deficiency of other specified B group vitamins: Secondary | ICD-10-CM | POA: Diagnosis not present

## 2023-10-25 DIAGNOSIS — I10 Essential (primary) hypertension: Secondary | ICD-10-CM | POA: Diagnosis not present

## 2023-10-25 DIAGNOSIS — E559 Vitamin D deficiency, unspecified: Secondary | ICD-10-CM | POA: Diagnosis not present

## 2023-10-25 DIAGNOSIS — E785 Hyperlipidemia, unspecified: Secondary | ICD-10-CM | POA: Diagnosis not present

## 2023-10-25 DIAGNOSIS — E119 Type 2 diabetes mellitus without complications: Secondary | ICD-10-CM | POA: Diagnosis not present

## 2023-10-30 DIAGNOSIS — I10 Essential (primary) hypertension: Secondary | ICD-10-CM | POA: Diagnosis not present

## 2023-10-30 DIAGNOSIS — Z1231 Encounter for screening mammogram for malignant neoplasm of breast: Secondary | ICD-10-CM | POA: Diagnosis not present

## 2023-10-30 DIAGNOSIS — E119 Type 2 diabetes mellitus without complications: Secondary | ICD-10-CM | POA: Diagnosis not present

## 2023-10-30 DIAGNOSIS — F900 Attention-deficit hyperactivity disorder, predominantly inattentive type: Secondary | ICD-10-CM | POA: Diagnosis not present

## 2023-10-30 DIAGNOSIS — E785 Hyperlipidemia, unspecified: Secondary | ICD-10-CM | POA: Diagnosis not present

## 2024-01-28 DIAGNOSIS — M549 Dorsalgia, unspecified: Secondary | ICD-10-CM | POA: Diagnosis not present

## 2024-05-19 DIAGNOSIS — Z79899 Other long term (current) drug therapy: Secondary | ICD-10-CM | POA: Diagnosis not present

## 2024-05-19 DIAGNOSIS — E785 Hyperlipidemia, unspecified: Secondary | ICD-10-CM | POA: Diagnosis not present

## 2024-05-28 DIAGNOSIS — Z9884 Bariatric surgery status: Secondary | ICD-10-CM | POA: Diagnosis not present

## 2024-05-28 DIAGNOSIS — E119 Type 2 diabetes mellitus without complications: Secondary | ICD-10-CM | POA: Diagnosis not present

## 2024-05-28 DIAGNOSIS — Z0000 Encounter for general adult medical examination without abnormal findings: Secondary | ICD-10-CM | POA: Diagnosis not present

## 2024-05-28 DIAGNOSIS — Z23 Encounter for immunization: Secondary | ICD-10-CM | POA: Diagnosis not present

## 2024-07-10 ENCOUNTER — Other Ambulatory Visit: Payer: Self-pay

## 2024-07-10 ENCOUNTER — Encounter (HOSPITAL_BASED_OUTPATIENT_CLINIC_OR_DEPARTMENT_OTHER): Payer: Self-pay

## 2024-07-10 DIAGNOSIS — Z833 Family history of diabetes mellitus: Secondary | ICD-10-CM

## 2024-07-10 DIAGNOSIS — N136 Pyonephrosis: Secondary | ICD-10-CM | POA: Diagnosis present

## 2024-07-10 DIAGNOSIS — I130 Hypertensive heart and chronic kidney disease with heart failure and stage 1 through stage 4 chronic kidney disease, or unspecified chronic kidney disease: Secondary | ICD-10-CM | POA: Diagnosis present

## 2024-07-10 DIAGNOSIS — Z87440 Personal history of urinary (tract) infections: Secondary | ICD-10-CM

## 2024-07-10 DIAGNOSIS — N189 Chronic kidney disease, unspecified: Secondary | ICD-10-CM | POA: Diagnosis present

## 2024-07-10 DIAGNOSIS — E872 Acidosis, unspecified: Secondary | ICD-10-CM | POA: Diagnosis present

## 2024-07-10 DIAGNOSIS — Z6841 Body Mass Index (BMI) 40.0 and over, adult: Secondary | ICD-10-CM

## 2024-07-10 DIAGNOSIS — M8458XA Pathological fracture in neoplastic disease, other specified site, initial encounter for fracture: Secondary | ICD-10-CM | POA: Diagnosis present

## 2024-07-10 DIAGNOSIS — K449 Diaphragmatic hernia without obstruction or gangrene: Secondary | ICD-10-CM | POA: Diagnosis present

## 2024-07-10 DIAGNOSIS — K760 Fatty (change of) liver, not elsewhere classified: Secondary | ICD-10-CM | POA: Diagnosis present

## 2024-07-10 DIAGNOSIS — M8448XA Pathological fracture, other site, initial encounter for fracture: Secondary | ICD-10-CM | POA: Diagnosis not present

## 2024-07-10 DIAGNOSIS — E1122 Type 2 diabetes mellitus with diabetic chronic kidney disease: Secondary | ICD-10-CM | POA: Diagnosis present

## 2024-07-10 DIAGNOSIS — I5032 Chronic diastolic (congestive) heart failure: Secondary | ICD-10-CM | POA: Diagnosis present

## 2024-07-10 DIAGNOSIS — Z823 Family history of stroke: Secondary | ICD-10-CM

## 2024-07-10 DIAGNOSIS — Z87891 Personal history of nicotine dependence: Secondary | ICD-10-CM

## 2024-07-10 DIAGNOSIS — Z79899 Other long term (current) drug therapy: Secondary | ICD-10-CM

## 2024-07-10 DIAGNOSIS — Z9884 Bariatric surgery status: Secondary | ICD-10-CM

## 2024-07-10 DIAGNOSIS — C9000 Multiple myeloma not having achieved remission: Principal | ICD-10-CM | POA: Diagnosis present

## 2024-07-10 DIAGNOSIS — Z800 Family history of malignant neoplasm of digestive organs: Secondary | ICD-10-CM

## 2024-07-10 DIAGNOSIS — F909 Attention-deficit hyperactivity disorder, unspecified type: Secondary | ICD-10-CM | POA: Diagnosis present

## 2024-07-10 DIAGNOSIS — M545 Low back pain, unspecified: Secondary | ICD-10-CM | POA: Diagnosis not present

## 2024-07-10 DIAGNOSIS — E785 Hyperlipidemia, unspecified: Secondary | ICD-10-CM | POA: Diagnosis present

## 2024-07-10 DIAGNOSIS — D649 Anemia, unspecified: Secondary | ICD-10-CM | POA: Diagnosis present

## 2024-07-10 DIAGNOSIS — N179 Acute kidney failure, unspecified: Secondary | ICD-10-CM | POA: Diagnosis present

## 2024-07-10 DIAGNOSIS — N12 Tubulo-interstitial nephritis, not specified as acute or chronic: Secondary | ICD-10-CM | POA: Diagnosis not present

## 2024-07-10 DIAGNOSIS — R000 Tachycardia, unspecified: Secondary | ICD-10-CM | POA: Diagnosis not present

## 2024-07-10 DIAGNOSIS — I2510 Atherosclerotic heart disease of native coronary artery without angina pectoris: Secondary | ICD-10-CM | POA: Diagnosis present

## 2024-07-10 NOTE — ED Triage Notes (Signed)
 Patient reports lower back pain that started last night and it has progressively worsened.  Patient unsure if it is a kidney stone passing or a uti or from when she was washing dishes last week and she heard a crack in her back. Patient denies loss of bowel/bladder.

## 2024-07-11 ENCOUNTER — Inpatient Hospital Stay (HOSPITAL_BASED_OUTPATIENT_CLINIC_OR_DEPARTMENT_OTHER)
Admission: EM | Admit: 2024-07-11 | Discharge: 2024-07-15 | DRG: 841 | Disposition: A | Discharge status: Home Health | Payer: Self-pay | Attending: Internal Medicine | Admitting: Internal Medicine

## 2024-07-11 ENCOUNTER — Emergency Department (HOSPITAL_BASED_OUTPATIENT_CLINIC_OR_DEPARTMENT_OTHER)

## 2024-07-11 ENCOUNTER — Inpatient Hospital Stay (HOSPITAL_COMMUNITY)

## 2024-07-11 DIAGNOSIS — Z823 Family history of stroke: Secondary | ICD-10-CM | POA: Diagnosis not present

## 2024-07-11 DIAGNOSIS — N261 Atrophy of kidney (terminal): Secondary | ICD-10-CM | POA: Diagnosis not present

## 2024-07-11 DIAGNOSIS — M898X9 Other specified disorders of bone, unspecified site: Secondary | ICD-10-CM | POA: Diagnosis not present

## 2024-07-11 DIAGNOSIS — Z87891 Personal history of nicotine dependence: Secondary | ICD-10-CM | POA: Diagnosis not present

## 2024-07-11 DIAGNOSIS — I5032 Chronic diastolic (congestive) heart failure: Secondary | ICD-10-CM | POA: Diagnosis present

## 2024-07-11 DIAGNOSIS — N12 Tubulo-interstitial nephritis, not specified as acute or chronic: Secondary | ICD-10-CM

## 2024-07-11 DIAGNOSIS — C9000 Multiple myeloma not having achieved remission: Secondary | ICD-10-CM | POA: Diagnosis not present

## 2024-07-11 DIAGNOSIS — J9811 Atelectasis: Secondary | ICD-10-CM | POA: Diagnosis not present

## 2024-07-11 DIAGNOSIS — M8448XA Pathological fracture, other site, initial encounter for fracture: Secondary | ICD-10-CM | POA: Diagnosis present

## 2024-07-11 DIAGNOSIS — S32010A Wedge compression fracture of first lumbar vertebra, initial encounter for closed fracture: Secondary | ICD-10-CM | POA: Diagnosis not present

## 2024-07-11 DIAGNOSIS — M549 Dorsalgia, unspecified: Secondary | ICD-10-CM | POA: Diagnosis not present

## 2024-07-11 DIAGNOSIS — E872 Acidosis, unspecified: Secondary | ICD-10-CM | POA: Diagnosis present

## 2024-07-11 DIAGNOSIS — M8458XA Pathological fracture in neoplastic disease, other specified site, initial encounter for fracture: Secondary | ICD-10-CM | POA: Diagnosis present

## 2024-07-11 DIAGNOSIS — N179 Acute kidney failure, unspecified: Secondary | ICD-10-CM | POA: Diagnosis not present

## 2024-07-11 DIAGNOSIS — Z6841 Body Mass Index (BMI) 40.0 and over, adult: Secondary | ICD-10-CM | POA: Diagnosis not present

## 2024-07-11 DIAGNOSIS — K760 Fatty (change of) liver, not elsewhere classified: Secondary | ICD-10-CM | POA: Diagnosis present

## 2024-07-11 DIAGNOSIS — Z800 Family history of malignant neoplasm of digestive organs: Secondary | ICD-10-CM | POA: Diagnosis not present

## 2024-07-11 DIAGNOSIS — M545 Low back pain, unspecified: Principal | ICD-10-CM

## 2024-07-11 DIAGNOSIS — I10 Essential (primary) hypertension: Secondary | ICD-10-CM | POA: Diagnosis not present

## 2024-07-11 DIAGNOSIS — I2510 Atherosclerotic heart disease of native coronary artery without angina pectoris: Secondary | ICD-10-CM | POA: Diagnosis present

## 2024-07-11 DIAGNOSIS — K449 Diaphragmatic hernia without obstruction or gangrene: Secondary | ICD-10-CM | POA: Diagnosis present

## 2024-07-11 DIAGNOSIS — E785 Hyperlipidemia, unspecified: Secondary | ICD-10-CM | POA: Diagnosis present

## 2024-07-11 DIAGNOSIS — R7989 Other specified abnormal findings of blood chemistry: Secondary | ICD-10-CM | POA: Diagnosis not present

## 2024-07-11 DIAGNOSIS — N132 Hydronephrosis with renal and ureteral calculous obstruction: Secondary | ICD-10-CM | POA: Diagnosis not present

## 2024-07-11 DIAGNOSIS — Z833 Family history of diabetes mellitus: Secondary | ICD-10-CM | POA: Diagnosis not present

## 2024-07-11 DIAGNOSIS — N136 Pyonephrosis: Secondary | ICD-10-CM | POA: Diagnosis present

## 2024-07-11 DIAGNOSIS — I130 Hypertensive heart and chronic kidney disease with heart failure and stage 1 through stage 4 chronic kidney disease, or unspecified chronic kidney disease: Secondary | ICD-10-CM | POA: Diagnosis present

## 2024-07-11 DIAGNOSIS — R9389 Abnormal findings on diagnostic imaging of other specified body structures: Secondary | ICD-10-CM | POA: Diagnosis not present

## 2024-07-11 DIAGNOSIS — Z79899 Other long term (current) drug therapy: Secondary | ICD-10-CM | POA: Diagnosis not present

## 2024-07-11 DIAGNOSIS — N189 Chronic kidney disease, unspecified: Secondary | ICD-10-CM | POA: Diagnosis present

## 2024-07-11 DIAGNOSIS — Z9884 Bariatric surgery status: Secondary | ICD-10-CM | POA: Diagnosis not present

## 2024-07-11 DIAGNOSIS — E1122 Type 2 diabetes mellitus with diabetic chronic kidney disease: Secondary | ICD-10-CM | POA: Diagnosis present

## 2024-07-11 DIAGNOSIS — D649 Anemia, unspecified: Secondary | ICD-10-CM | POA: Diagnosis present

## 2024-07-11 DIAGNOSIS — M898X8 Other specified disorders of bone, other site: Secondary | ICD-10-CM | POA: Diagnosis not present

## 2024-07-11 DIAGNOSIS — F909 Attention-deficit hyperactivity disorder, unspecified type: Secondary | ICD-10-CM | POA: Diagnosis present

## 2024-07-11 LAB — CBC WITH DIFFERENTIAL/PLATELET
Abs Immature Granulocytes: 0.02 K/uL (ref 0.00–0.07)
Basophils Absolute: 0 K/uL (ref 0.0–0.1)
Basophils Relative: 0 %
Eosinophils Absolute: 0.2 K/uL (ref 0.0–0.5)
Eosinophils Relative: 3 %
HCT: 34 % — ABNORMAL LOW (ref 36.0–46.0)
Hemoglobin: 11.6 g/dL — ABNORMAL LOW (ref 12.0–15.0)
Immature Granulocytes: 0 %
Lymphocytes Relative: 19 %
Lymphs Abs: 1.5 K/uL (ref 0.7–4.0)
MCH: 31.7 pg (ref 26.0–34.0)
MCHC: 34.1 g/dL (ref 30.0–36.0)
MCV: 92.9 fL (ref 80.0–100.0)
Monocytes Absolute: 0.8 K/uL (ref 0.1–1.0)
Monocytes Relative: 10 %
Neutro Abs: 5.5 K/uL (ref 1.7–7.7)
Neutrophils Relative %: 68 %
Platelets: 418 K/uL — ABNORMAL HIGH (ref 150–400)
RBC: 3.66 MIL/uL — ABNORMAL LOW (ref 3.87–5.11)
RDW: 14.4 % (ref 11.5–15.5)
WBC: 8 K/uL (ref 4.0–10.5)
nRBC: 0 % (ref 0.0–0.2)

## 2024-07-11 LAB — COMPREHENSIVE METABOLIC PANEL WITH GFR
ALT: 15 U/L (ref 0–44)
AST: 35 U/L (ref 15–41)
Albumin: 4.6 g/dL (ref 3.5–5.0)
Alkaline Phosphatase: 80 U/L (ref 38–126)
Anion gap: 18 — ABNORMAL HIGH (ref 5–15)
BUN: 51 mg/dL — ABNORMAL HIGH (ref 6–20)
CO2: 20 mmol/L — ABNORMAL LOW (ref 22–32)
Calcium: 13 mg/dL — ABNORMAL HIGH (ref 8.9–10.3)
Chloride: 99 mmol/L (ref 98–111)
Creatinine, Ser: 2.31 mg/dL — ABNORMAL HIGH (ref 0.44–1.00)
GFR, Estimated: 26 mL/min — ABNORMAL LOW
Glucose, Bld: 105 mg/dL — ABNORMAL HIGH (ref 70–99)
Potassium: 4.3 mmol/L (ref 3.5–5.1)
Sodium: 136 mmol/L (ref 135–145)
Total Bilirubin: 0.4 mg/dL (ref 0.0–1.2)
Total Protein: 7.2 g/dL (ref 6.5–8.1)

## 2024-07-11 LAB — URINALYSIS, ROUTINE W REFLEX MICROSCOPIC
Bilirubin Urine: NEGATIVE
Glucose, UA: NEGATIVE mg/dL
Ketones, ur: NEGATIVE mg/dL
Nitrite: NEGATIVE
Protein, ur: >=300 mg/dL — AB
Specific Gravity, Urine: 1.02 (ref 1.005–1.030)
pH: 5.5 (ref 5.0–8.0)

## 2024-07-11 LAB — URINALYSIS, MICROSCOPIC (REFLEX): WBC, UA: >50 WBC/hpf (ref 0–5)

## 2024-07-11 LAB — PROCALCITONIN: Procalcitonin: 1.09 ng/mL

## 2024-07-11 LAB — CBC
HCT: 34.4 % — ABNORMAL LOW (ref 36.0–46.0)
Hemoglobin: 11.3 g/dL — ABNORMAL LOW (ref 12.0–15.0)
MCH: 31.7 pg (ref 26.0–34.0)
MCHC: 32.8 g/dL (ref 30.0–36.0)
MCV: 96.6 fL (ref 80.0–100.0)
Platelets: 362 K/uL (ref 150–400)
RBC: 3.56 MIL/uL — ABNORMAL LOW (ref 3.87–5.11)
RDW: 14.6 % (ref 11.5–15.5)
WBC: 6.7 K/uL (ref 4.0–10.5)
nRBC: 0 % (ref 0.0–0.2)

## 2024-07-11 LAB — LACTATE DEHYDROGENASE: LDH: 243 U/L — ABNORMAL HIGH (ref 105–235)

## 2024-07-11 LAB — PREGNANCY, URINE: Preg Test, Ur: NEGATIVE

## 2024-07-11 LAB — HIV ANTIBODY (ROUTINE TESTING W REFLEX): HIV Screen 4th Generation wRfx: NONREACTIVE

## 2024-07-11 LAB — VITAMIN D 25 HYDROXY (VIT D DEFICIENCY, FRACTURES): Vit D, 25-Hydroxy: 76.3 ng/mL (ref 30–100)

## 2024-07-11 LAB — TROPONIN T, HIGH SENSITIVITY: Troponin T High Sensitivity: 66 ng/L — ABNORMAL HIGH (ref 0–19)

## 2024-07-11 LAB — CREATININE, SERUM
Creatinine, Ser: 1.92 mg/dL — ABNORMAL HIGH (ref 0.44–1.00)
GFR, Estimated: 33 mL/min — ABNORMAL LOW

## 2024-07-11 MED ORDER — HYDROCHLOROTHIAZIDE 12.5 MG PO TABS: 12.5000 mg | ORAL_TABLET | Freq: Every day | ORAL | Status: DC

## 2024-07-11 MED ORDER — METHOCARBAMOL 500 MG PO TABS
500.0000 mg | ORAL_TABLET | Freq: Three times a day (TID) | ORAL | Status: DC
  Administered 2024-07-11 – 2024-07-15 (×13): 500 mg via ORAL
  Filled 2024-07-11 (×6): qty 1

## 2024-07-11 MED ORDER — CHLORHEXIDINE GLUCONATE CLOTH 2 % EX PADS: 6.0000 | MEDICATED_PAD | Freq: Every day | CUTANEOUS | Status: DC

## 2024-07-11 MED ORDER — FENTANYL CITRATE (PF) 50 MCG/ML IJ SOSY
100.0000 ug | PREFILLED_SYRINGE | INTRAMUSCULAR | Status: DC | PRN
  Administered 2024-07-11 (×2): 100 ug via INTRAVENOUS
  Filled 2024-07-11 (×2): qty 2

## 2024-07-11 MED ORDER — LACTATED RINGERS IV BOLUS
1000.0000 mL | Freq: Once | INTRAVENOUS | Status: AC
  Administered 2024-07-11: 1000 mL via INTRAVENOUS

## 2024-07-11 MED ORDER — LOSARTAN POTASSIUM-HCTZ 100-12.5 MG PO TABS: 1.0000 | ORAL_TABLET | Freq: Every day | ORAL | Status: DC

## 2024-07-11 MED ORDER — CARVEDILOL 12.5 MG PO TABS
12.5000 mg | ORAL_TABLET | Freq: Two times a day (BID) | ORAL | Status: DC
  Administered 2024-07-11 – 2024-07-12 (×2): 12.5 mg via ORAL
  Filled 2024-07-11 (×2): qty 1

## 2024-07-11 MED ORDER — ADULT MULTIVITAMIN W/MINERALS CH
1.0000 | ORAL_TABLET | Freq: Every day | ORAL | Status: DC
  Administered 2024-07-11 – 2024-07-14 (×4): 1 via ORAL
  Filled 2024-07-11 (×2): qty 1

## 2024-07-11 MED ORDER — CARVEDILOL 12.5 MG PO TABS: 12.5000 mg | ORAL_TABLET | Freq: Two times a day (BID) | ORAL | Status: DC

## 2024-07-11 MED ORDER — SODIUM CHLORIDE 0.9 % IV SOLN
1.0000 g | Freq: Once | INTRAVENOUS | Status: AC
  Administered 2024-07-11: 1 g via INTRAVENOUS
  Filled 2024-07-11: qty 10

## 2024-07-11 MED ORDER — HYDROCODONE-ACETAMINOPHEN 5-325 MG PO TABS: 1.0000 | ORAL_TABLET | ORAL | Status: DC | PRN

## 2024-07-11 MED ORDER — GABAPENTIN 300 MG PO CAPS
300.0000 mg | ORAL_CAPSULE | Freq: Three times a day (TID) | ORAL | Status: DC
  Administered 2024-07-11 – 2024-07-15 (×13): 300 mg via ORAL
  Filled 2024-07-11 (×6): qty 1

## 2024-07-11 MED ORDER — CYCLOBENZAPRINE HCL 10 MG PO TABS
10.0000 mg | ORAL_TABLET | Freq: Once | ORAL | Status: AC
  Administered 2024-07-11: 10 mg via ORAL
  Filled 2024-07-11: qty 1

## 2024-07-11 MED ORDER — MORPHINE SULFATE (PF) 4 MG/ML IV SOLN
4.0000 mg | Freq: Once | INTRAVENOUS | Status: AC
  Administered 2024-07-11: 4 mg via INTRAVENOUS
  Filled 2024-07-11: qty 1

## 2024-07-11 MED ORDER — LOSARTAN POTASSIUM 50 MG PO TABS
100.0000 mg | ORAL_TABLET | Freq: Every day | ORAL | Status: DC
  Administered 2024-07-11: 100 mg via ORAL
  Filled 2024-07-11 (×2): qty 2

## 2024-07-11 MED ORDER — FUROSEMIDE 40 MG PO TABS
40.0000 mg | ORAL_TABLET | Freq: Every day | ORAL | Status: DC
  Administered 2024-07-11: 40 mg via ORAL
  Filled 2024-07-11 (×2): qty 1

## 2024-07-11 MED ORDER — PANTOPRAZOLE SODIUM 40 MG PO TBEC
40.0000 mg | DELAYED_RELEASE_TABLET | Freq: Every day | ORAL | Status: DC
  Administered 2024-07-11 – 2024-07-15 (×5): 40 mg via ORAL
  Filled 2024-07-11 (×2): qty 1

## 2024-07-11 MED ORDER — SODIUM CHLORIDE 0.9 % IV SOLN: INTRAVENOUS | Status: AC

## 2024-07-11 MED ORDER — OXYCODONE HCL 5 MG PO TABS
5.0000 mg | ORAL_TABLET | ORAL | Status: DC | PRN
  Administered 2024-07-11 – 2024-07-15 (×12): 5 mg via ORAL
  Filled 2024-07-11 (×6): qty 1

## 2024-07-11 MED ORDER — ACETAMINOPHEN 500 MG PO TABS
1000.0000 mg | ORAL_TABLET | Freq: Three times a day (TID) | ORAL | Status: DC
  Administered 2024-07-11 – 2024-07-15 (×13): 1000 mg via ORAL
  Filled 2024-07-11 (×6): qty 2

## 2024-07-11 MED ORDER — SODIUM CHLORIDE 0.9 % IV SOLN
1.0000 g | INTRAVENOUS | Status: AC
  Administered 2024-07-12 – 2024-07-15 (×4): 1 g via INTRAVENOUS
  Filled 2024-07-11: qty 10

## 2024-07-11 MED ORDER — ENOXAPARIN SODIUM 80 MG/0.8ML IJ SOSY
70.0000 mg | PREFILLED_SYRINGE | INTRAMUSCULAR | Status: DC
  Administered 2024-07-11 – 2024-07-12 (×2): 70 mg via SUBCUTANEOUS
  Filled 2024-07-11 (×2): qty 0.8

## 2024-07-11 MED ORDER — LISDEXAMFETAMINE DIMESYLATE 20 MG PO CAPS
40.0000 mg | ORAL_CAPSULE | Freq: Every day | ORAL | Status: DC
  Administered 2024-07-11 – 2024-07-15 (×5): 40 mg via ORAL
  Filled 2024-07-11 (×2): qty 2

## 2024-07-11 MED ORDER — METHOCARBAMOL 500 MG PO TABS: 500.0000 mg | ORAL_TABLET | Freq: Two times a day (BID) | ORAL | Status: DC

## 2024-07-11 MED ORDER — ACETAMINOPHEN 500 MG PO TABS
1000.0000 mg | ORAL_TABLET | Freq: Once | ORAL | Status: AC
  Administered 2024-07-11: 1000 mg via ORAL
  Filled 2024-07-11: qty 2

## 2024-07-11 MED ORDER — CARVEDILOL 12.5 MG PO TABS
12.5000 mg | ORAL_TABLET | Freq: Every day | ORAL | Status: DC
  Administered 2024-07-11: 12.5 mg via ORAL
  Filled 2024-07-11: qty 1

## 2024-07-11 NOTE — Consult Note (Signed)
 "   I have been asked to see the patient by Dr. Marsha Ada, for evaluation and management of hydronephrosis.  History of present illness: 43 year old African-American female presented to the emergency department with intractable low back pain and dysuria.  This had been present for about a week or so.  The pain did progress, appears to be mostly in the right flank.  She was also having some dysuria that has been progressive.  She was not having any fevers or chills.  She was not having any nausea or vomiting.  In the emergency department she was noted to have an elevated creatinine, urine analysis fairly reassuring without any real significant concern for infection, and a CT scan that was performed demonstrating an L1 compression fracture, multiple lytic lesions in the left rib, iliac crest, and vertebral bodies as well as bilateral UPJ obstructions and an atrophic right kidney.  The patient has a known history of bilateral UPJ obstructions.  She has a atrophic right kidney.  She has had several CT scans over the course of the last 10 to 15 years that have demonstrated stability of these findings.  In addition, the patient was seen in our office for recurrent urinary tract infections over the course of the last many years with a really otherwise insignificant workup.  Review of systems: A 12 point comprehensive review of systems was obtained and is negative unless otherwise stated in the history of present illness.  Patient Active Problem List   Diagnosis Date Noted   Pathologic fracture of lumbar vertebra 07/11/2024   DM type 2 (diabetes mellitus, type 2) (HCC) 04/16/2012   Chronic systolic CHF (congestive heart failure) (HCC) 04/16/2012   Abnormal ultrasound of kidney 04/16/2012   Pancreatitis, acute 04/15/2012   SCARLET FEVER 11/22/2008   PULMONARY HYPERTENSION 11/22/2008   SYSTOLIC HEART FAILURE, ACUTE ON CHRONIC 11/22/2008   UNSPECIFIED TACHYCARDIA 11/22/2008   DM 11/20/2008   Morbid  obesity (HCC) 11/20/2008   Essential hypertension 11/20/2008   CARDIOMEGALY 11/20/2008   Sleep apnea 11/20/2008   LEG EDEMA 11/20/2008    Medications Ordered Prior to Encounter[1]  Past Medical History:  Diagnosis Date   Anemia    history only - no current problem   CHF (congestive heart failure) (HCC)    ECHO cardiogram schedule 07/11/11   Diabetes mellitus    no med - last A1C was 5.9  on 04/2011   Hyperlipidemia    Hypertension    Sleep apnea    does not use CPAP   UTI (lower urinary tract infection)    Vitamin D  deficiency     Past Surgical History:  Procedure Laterality Date   GASTRIC BYPASS  01/15/2011   UPPER GASTROINTESTINAL ENDOSCOPY     VULVAR LESION REMOVAL  07/13/2011   Procedure: VULVAR LESION;  Surgeon: Dickie DELENA Carder, MD;  Location: [facility];  Service: Gynecology;  Laterality: Right;  Excision of right vulvar mass.    Social History[2]  History reviewed. No pertinent family history.  PE: Vitals:   07/11/24 0500 07/11/24 0530 07/11/24 0700 07/11/24 0846  BP: (!) 147/96 (!) 161/105 (!) 154/111 (!) 162/96  Pulse: (!) 112 (!) 112 (!) 111 (!) 109  Resp: 16 17 18 18   Temp:    97.9 F (36.6 C)  TempSrc:    Oral  SpO2: 97% 96% 96% 100%  Weight:      Height:       Patient appears to be in no acute distress  patient is alert  and oriented x3 Atraumatic normocephalic head No cervical or supraclavicular lymphadenopathy appreciated No increased work of breathing, no audible wheezes/rhonchi Regular sinus rhythm/rate Abdomen is soft, nontender, nondistended Lower extremities are symmetric without appreciable edema Grossly neurologically intact No identifiable skin lesions  Recent Labs    07/11/24 0300  WBC 8.0  HGB 11.6*  HCT 34.0*   Recent Labs    07/11/24 0140  NA 136  K 4.3  CL 99  CO2 20*  GLUCOSE 105*  BUN 51*  CREATININE 2.31*  CALCIUM 13.0*   No results for input(s): LABPT, INR in the last 72 hours. No results for input(s):  LABURIN in the last 72 hours. Results for orders placed or performed during the hospital encounter of 07/11/24  Blood culture (routine x 2)     Status: None (Preliminary result)   Collection Time: 07/11/24  6:06 AM   Specimen: BLOOD  Result Value Ref Range Status   Specimen Description   Final    BLOOD LEFT ANTECUBITAL Performed at [facility], [address] [place], [place] [address]    Special Requests   Final    BOTTLES DRAWN AEROBIC AND ANAEROBIC Blood Culture adequate volume Performed at [facility], [address] [place], [place] [address]    Culture   Final    NO GROWTH <12 HOURS Performed at [facility], [address], [place], [place] [address]    Report Status PENDING  Incomplete    Imaging: I have independently reviewed the patient's CT scan performed in the emergency department on 12/27.  I also reviewed her scan from 2020 and her [facility] in 2008.  She has known bilateral UPJ obstructions and significant scarring on her kidneys bilaterally.  She also has an atrophic right kidney which has been present for at least the last 5 years.  She has no hydroureter.  The renal parenchyma on the left kidney appears to be preserved.   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  These findings are chronic.  There is preservation of the parenchyma in the left kidney indicating a low-grade obstruction if any at all.  Her renal function up until a few months ago was completely normal.  She is not having any kidney pain.  Recommendation: I recommend no additional intervention from a urologic perspective at this time.  I am concerned about the lytic bone lesion findings on the CT scan.  I see that a multiple myeloma workup as been started.  I will continue to follow along, but at this point I have no plans for any interventions from a urologic perspective.  Morene LELON Salines        [1]  No current  facility-administered medications on file prior to encounter.   Current Outpatient Medications on File Prior to Encounter  Medication Sig Dispense Refill   lisdexamfetamine  (VYVANSE ) 40 MG capsule Take 40 mg by mouth every morning.     carvedilol  (COREG ) 12.5 MG tablet Take 12.5 mg by mouth daily after breakfast.      Cholecalciferol (VITAMIN D -3) 25 MCG (1000 UT) CAPS Take 5,000 Units by mouth daily.     esomeprazole (NEXIUM) 20 MG capsule Take 20 mg by mouth daily before breakfast.     famotidine  (PEPCID ) 20 MG tablet Take 20 mg by mouth daily after breakfast.     furosemide  (LASIX ) 40 MG tablet Take 40 mg by mouth daily after breakfast.  HYDROcodone -acetaminophen  (NORCO/VICODIN) 5-325 MG tablet Take 1-2 tablets by mouth every 6 (six) hours as needed. 8 tablet 0   ibuprofen  (ADVIL ) 600 MG tablet Take 1 tablet (600 mg total) by mouth every 6 (six) hours as needed. 30 tablet 0   losartan -hydrochlorothiazide  (HYZAAR) 100-12.5 MG tablet Take 1 tablet by mouth daily after breakfast.     methocarbamol  (ROBAXIN ) 500 MG tablet Take 1 tablet (500 mg total) by mouth 2 (two) times daily. 20 tablet 0   Multiple Vitamin (MULTIVITAMIN WITH MINERALS) TABS tablet Take 1 tablet by mouth daily.     valACYclovir (VALTREX) 500 MG tablet Take 500 mg by mouth daily.    [2]  Social History Tobacco Use   Smoking status: Former    Current packs/day: 0.00    Average packs/day: 0.3 packs/day for 1 year (0.3 ttl pk-yrs)    Types: Cigarettes    Start date: 12/15/1999    Quit date: 12/14/2000    Years since quitting: 23.5   Smokeless tobacco: Never  Substance Use Topics   Alcohol use: Yes    Comment: Rarely - twice a year   Drug use: No   "

## 2024-07-11 NOTE — ED Notes (Signed)
 7 total failed attempts at IV placement and attempts to get labs. Unable to get either.  MD aware.

## 2024-07-11 NOTE — ED Notes (Signed)
 Attempted an IV x 2 without success. Patient tolerated well.

## 2024-07-11 NOTE — Progress Notes (Signed)
 Patient transported to CT

## 2024-07-11 NOTE — ED Notes (Signed)
 1 set of blood cultures obtained. 2nd unable to be obtained due to difficult stick. (8x)

## 2024-07-11 NOTE — Consult Note (Signed)
 Hematology/Oncology Consult Note  Clinical Summary: Mrs. Kathryn [name] is a 43 year old female who presented with back pain and was found to have a pathologic L1 fracture and other concerning lytic lesions of the bone.  Reason for Consult: Pathologic fractures and lytic lesions concerning for multiple myeloma versus metastatic disease  HPI: Mrs. Kathryn [name] is a 43 year old female with medical history significant for morbid obesity, OSA, heart failure with preserved ejection fraction, hypertension, hyperlipidemia, and type 2 diabetes who began developing back pain approximately 1.5 weeks ago.  She was washing dishes when she felt a pop and a cracking sensation in her back and severe/intense pain.  She presented emergency department after this event and was noted to have an elevation in her creatinine up to 2.31, hypercalcemia, and tachycardia.  She underwent a CT scan on 07/11/2024 which showed expansile intramedullary predominantly lytic lesion of the left eighth rib measuring 5.3 x 1.5 cm.  Due to concern for this finding oncology was consulted for further evaluation and management.  On exam today Kathryn [name] reports she is feeling much better now that her UTI is being treated.  She is continue to have some pain in her back but it is improved from admission.  She reports she is never had any nausea, vomit, or diarrhea.  She is not having any constipation or abdominal pain.  She reports she has had a gastric sleeve procedure in 2012 and was 1 point time down to 250 pounds.  She reports that she is actually been gaining weight rather than losing weight lately.  She also reports she has had no change in the color of the urine and no bubbling or foaming.  She denies any fevers, chills, sweats, nausea, vomiting or diarrhea.  Full 10 point ROS otherwise negative.    O:  Vitals:   07/11/24 0846 07/11/24 1115  BP: (!) 162/96 (!) 162/96  Pulse: (!) 109 (!) 109  Resp: 18   Temp: 97.9 F (36.6 C)    SpO2: 100%       Latest Ref Rng & Units 07/11/2024   11:15 AM 07/11/2024    1:40 AM 07/06/2019    2:05 AM  CMP  Glucose 70 - 99 mg/dL  894  864   BUN 6 - 20 mg/dL  51  10   Creatinine 9.55 - 1.00 mg/dL 8.07  7.68  9.21   Sodium 135 - 145 mmol/L  136  135   Potassium 3.5 - 5.1 mmol/L  4.3  3.2   Chloride 98 - 111 mmol/L  99  96   CO2 22 - 32 mmol/L  20  23   Calcium 8.9 - 10.3 mg/dL  86.9  9.0   Total Protein 6.5 - 8.1 g/dL  7.2    Total Bilirubin 0.0 - 1.2 mg/dL  0.4    Alkaline Phos 38 - 126 U/L  80    AST 15 - 41 U/L  35    ALT 0 - 44 U/L  15        Latest Ref Rng & Units 07/11/2024   11:15 AM 07/11/2024    3:00 AM 07/06/2019    2:05 AM  CBC  WBC 4.0 - 10.5 K/uL 6.7  8.0  10.7   Hemoglobin 12.0 - 15.0 g/dL 88.6  88.3  86.9   Hematocrit 36.0 - 46.0 % 34.4  34.0  41.8   Platelets 150 - 400 K/uL 362  418  400       GENERAL: well  appearing obese African-American female in NAD  SKIN: skin color, texture, turgor are normal, no rashes or significant lesions EYES: conjunctiva are pink and non-injected, sclera clear OROPHARYNX: no exudate, no erythema; lips, buccal mucosa, and tongue normal  NECK: supple, non-tender LYMPH:  no palpable lymphadenopathy in the cervical, axillary or supraclavicular lymph nodes.  LUNGS: clear to auscultation and percussion with normal breathing effort HEART: regular rate & rhythm and no murmurs and no lower extremity edema Musculoskeletal: no cyanosis of digits and no clubbing  PSYCH: alert & oriented x 3, fluent speech NEURO: no focal motor/sensory deficits  Assessment/Plan:  # Pathologic L1 Compression Fraction # Posterior Left 8th Rib Lesion # Diffuse Skeletal Lytic Lesions  -- CT scan on 07/11/2024 showed expansile intramedullary predominantly lytic lesion of the left eighth rib measuring 5.3 x 1.5 cm. -- Additionally patient found to have an L5 lytic lesion as well as iliac crest lytic lesions measuring 2.5 x 1 cm.  Concern for  pathologic L1 fracture. -- At this time findings are deeply concerning for multiple myeloma versus metastatic disease. -- Recommend CT scan of the chest to complete staging. -- Will order multiple myeloma labs with SPEP, UPEP, and serum free light chains -- Additionally we will move forward with a bone marrow biopsy performed on Monday, 07/13/2024. -- Oncology service will continue to follow.   Norleen IVAR Kidney, MD Department of Hematology/Oncology [facility] Phone: [phone] Pager: [phone] Email: [email or web address]

## 2024-07-11 NOTE — Consult Note (Cosign Needed Addendum)
 "  Chief Complaint: Back pain, lytic bony lesions, acute kidney injury, concern for multiple myeloma vs metastatic disease; referred for image guided bone marrow biopsy  Referring Provider(s): Moore,W/Dorsey,J  Supervising Physician: Jenna Hacker  Patient Status: [facility] - In-pt  History of Present Illness: Kathryn [name] is a 43 y.o. female ex smoker  with past medical history significant for anemia, CHF, diabetes, hyperlipidemia, hypertension, ADHD, sleep apnea, vit D deficiency, morbid obesity with prior gastric bypass who was recently admitted to [facility] with persistent back pain, KI, cystitis and imaging revealing:  1. Recent comminuted wedge compression fracture of the L1 vertebral body with mild paraspinal soft tissue edema and slight retropulsion, likely pathologic. 2. No nephrolithiasis or obstructing ureteral stone. 3. New expansile intramedullary lytic lesion in the posterior left 8th rib, suspicious for neoplasm (including multiple myeloma or metastatic disease), without a primary malignancy identified in the abdomen or pelvis. 4. Additional lytic lesions in the L5 vertebral body and both medial iliac crests, suspicious for metastases or myeloma. 5. Chronic bilateral hydronephrosis with UPJ configuration, including chronic moderate to severe right renal pelvic dilatation with cortical scarring and volume loss, and increased left perinephric stranding since the prior study, which may be chronic or inflammatory. 6. Mildly prominent fatty liver. 7. Hiatal hernia. 8. Aortic and coronary artery atherosclerosis   No prior history of malignancy. She is afebrile, sl tachycardic, WBC normal, hemoglobin 11.3, platelets nl, creatinine 1.92 , myeloma panel pending ;blood/urine cultures pending  Request  now received from primary team/oncology for image guided bone marrow biopsy for further evaluation   Patient is Full Code  Past Medical History:  Diagnosis Date    Anemia    history only - no current problem   CHF (congestive heart failure) (HCC)    ECHO cardiogram schedule 07/11/11   Diabetes mellitus    no med - last A1C was 5.9  on 04/2011   Hyperlipidemia    Hypertension    Sleep apnea    does not use CPAP   UTI (lower urinary tract infection)    Vitamin D  deficiency     Past Surgical History:  Procedure Laterality Date   GASTRIC BYPASS  01/15/2011   UPPER GASTROINTESTINAL ENDOSCOPY     VULVAR LESION REMOVAL  07/13/2011   Procedure: VULVAR LESION;  Surgeon: Dickie DELENA Carder, MD;  Location: [facility];  Service: Gynecology;  Laterality: Right;  Excision of right vulvar mass.    Allergies: Pravastatin, Floxin [ofloxacin], Lisinopril , and Sulfa antibiotics  Medications: Prior to Admission medications  Medication Sig Start Date End Date Taking? Authorizing Provider  lisdexamfetamine  (VYVANSE ) 40 MG capsule Take 40 mg by mouth every morning. 06/18/24  Yes [provider]  carvedilol  (COREG ) 12.5 MG tablet Take 12.5 mg by mouth daily after breakfast.     [provider]  Cholecalciferol (VITAMIN D -3) 25 MCG (1000 UT) CAPS Take 5,000 Units by mouth daily.    [provider]  esomeprazole (NEXIUM) 20 MG capsule Take 20 mg by mouth daily before breakfast.    [provider]  famotidine  (PEPCID ) 20 MG tablet Take 20 mg by mouth daily after breakfast.    [provider]  furosemide  (LASIX ) 40 MG tablet Take 40 mg by mouth daily after breakfast.    [provider]  HYDROcodone -acetaminophen  (NORCO/VICODIN) 5-325 MG tablet Take 1-2 tablets by mouth every 6 (six) hours as needed. 07/06/19   Odell Balls, PA-C  ibuprofen  (ADVIL ) 600 MG tablet Take 1 tablet (600 mg  total) by mouth every 6 (six) hours as needed. 07/06/19   Odell Balls, PA-C  losartan -hydrochlorothiazide  (HYZAAR) 100-12.5 MG tablet Take 1 tablet by mouth daily after breakfast.    [provider]  methocarbamol  (ROBAXIN ) 500  MG tablet Take 1 tablet (500 mg total) by mouth 2 (two) times daily. 07/06/19   Odell Balls, PA-C  Multiple Vitamin (MULTIVITAMIN WITH MINERALS) TABS tablet Take 1 tablet by mouth daily.    [provider]  valACYclovir (VALTREX) 500 MG tablet Take 500 mg by mouth daily.    [provider]     History reviewed. No pertinent family history.  Social History   Socioeconomic History   Marital status: Married    Spouse name: Not on file   Number of children: Not on file   Years of education: Not on file   Highest education level: Not on file  Occupational History   Not on file  Tobacco Use   Smoking status: Former    Current packs/day: 0.00    Average packs/day: 0.3 packs/day for 1 year (0.3 ttl pk-yrs)    Types: Cigarettes    Start date: 12/15/1999    Quit date: 12/14/2000    Years since quitting: 23.5   Smokeless tobacco: Never  Substance and Sexual Activity   Alcohol use: Yes    Comment: Rarely - twice a year   Drug use: No   Sexual activity: Never    Birth control/protection: Condom  Other Topics Concern   Not on file  Social History Narrative   Not on file   Social Drivers of Health   Tobacco Use: Medium Risk (07/10/2024)   Patient History    Smoking Tobacco Use: Former    Smokeless Tobacco Use: Never    Passive Exposure: Not on Actuary Strain: Not on file  Food Insecurity: Not on file  Transportation Needs: Not on file  Physical Activity: Not on file  Stress: Not on file  Social Connections: Not on file  Depression (EYV7-0): Not on file  Alcohol Screen: Not on file  Housing: Not on file  Utilities: Not on file  Health Literacy: Not on file       Review of Systems currently denies fever, headache, chest pain, dyspnea, cough, nausea, vomiting or bleeding.  Does have back pain and some suprapubic discomfort.  Vital Signs: BP (!) 162/96   Pulse (!) 109   Temp 97.9 F (36.6 C) (Oral)   Resp 18   Ht 5' 6 (1.676 m)   Wt  (!) 320 lb (145.2 kg)   SpO2 100%   BMI 51.65 kg/m   Advance Care Plan: No documents on file.  Physical Exam awake, alert.  Chest with clear breath sounds bilaterally anteriorly.  Heart with slightly tachycardic but regular rhythm.  Abdomen obese, soft, positive bowel sounds, nontender.  No significant lower extremity edema  Imaging: CT ABDOMEN PELVIS WO CONTRAST Result Date: 07/11/2024 EXAM: CT ABDOMEN AND PELVIS WITHOUT CONTRAST 07/11/2024 03:28:00 AM TECHNIQUE: CT of the abdomen and pelvis was performed without the administration of intravenous contrast. Multiplanar reformatted images are provided for review. Automated exposure control, iterative reconstruction, and/or weight-based adjustment of the mA/kV was utilized to reduce the radiation dose to as low as reasonably achievable. COMPARISON: CT without contrast 07/06/2019, CT with contrast 03/31/2015. CLINICAL HISTORY: Low back pain onset last night, progressively worsening since. Unsure if this is a kidney stone or urinary tract infection. She also felt a painful pop in her back  last week. FINDINGS: LOWER CHEST: Mild atelectasis in the posterior lower lobes. Lung bases are clear of infiltrates with mild chronic elevation of the right diaphragm. The cardiac size is normal. There is a small hiatal hernia. There is calcification in the LAD (Left Anterior Descending) coronary artery. LIVER: The liver is enlarged, measuring 23 cm in length, and is mildly steatotic. No mass is seen without contrast. GALLBLADDER AND BILE DUCTS: There are tiny stones layering posteriorly in the gallbladder, but no wall thickening or biliary dilatation. SPLEEN: The spleen is normal in size and noncontrast attenuation. PANCREAS: The pancreas is normal in size, shape and noncontrast attenuation. ADRENAL GLANDS: There is no adrenal mass. KIDNEYS, URETERS AND BLADDER: No renal mass is seen without contrast bilaterally. There is chronic moderate to severe dilatation in the right  renal pelvis with patchy cortical scarring and volume loss of the right kidney. The right ureter is small in caliber without obstructing stone. This was seen previously, consistent with a chronic UPJ (Ureteropelvic Junction) stenosis. On the left, there is also chronic hydronephrosis ending at the UPJ but this kidney demonstrates compensatory hypertrophy without focal volume loss. There is increased left perinephric stranding since the prior study, which could be chronic or inflammatory. There is no nephrolithiasis, ureteral or bladder stones. There are left gonadal vein phleboliths. Multiple pelvic phleboliths. Urinary bladder is unremarkable. 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 Stomach demonstrates no acute abnormality. There is no small bowel obstruction or inflammation. The appendix is normal. There is sigmoid diverticulosis without diverticulitis. There is no bowel obstruction. PERITONEUM AND RETROPERITONEUM: No ascites. No free air. No free hemorrhage. No incarcerated hernia. VASCULATURE: Aorta is normal in caliber. There is mild aortic atherosclerosis without aneurysm. LYMPH NODES: No lymphadenopathy. REPRODUCTIVE ORGANS: No acute abnormality. BONES AND SOFT TISSUES: There is a new expansile intramedullary predominantly lucent/lytic lesion with areas of cortical dehiscence involving the posterior left 8th rib measuring 5.3 x 1.5 cm, without an overt pathologic fracture.+ This is suspicious for primary or metastatic neoplasm including multiple myeloma. If this is metastatic disease, I do not see a primary source for it on this exam. In addition, there is evidence of a recent comminuted wedge compression fracture of the L1 vertebral body, with fragmentation centrally and mild edema in the paraspinal soft tissues without paraspinal hematoma. Loss of vertebral body height is 30% anteriorly, up to 70%  centrally, and about 10% posteriorly with slight posterior superior retropulsion. I do not see a soft tissue component to this infiltrating the spinal canal. I suspect this was probably a pathologic fracture. There is a somewhat lucent appearance in the central L1 body. The L5 body demonstrates several small lytic lesions. There are lytic lesions in both medial iliac crests measuring 2.5 x 1 cm on the right and 1.2 cm on the left. No other focal bone lesion is seen. No focal soft tissue abnormality. IMPRESSION: 1. Recent comminuted wedge compression fracture of the L1 vertebral body with mild paraspinal soft tissue edema and slight retropulsion, likely pathologic. 2. No nephrolithiasis or obstructing ureteral stone. 3. New expansile intramedullary lytic lesion in the posterior left 8th rib, suspicious for neoplasm (including multiple myeloma or metastatic disease), without a primary malignancy identified in the abdomen or pelvis. 4. Additional lytic lesions in the L5 vertebral body and both medial iliac crests, suspicious for metastases or myeloma. 5. Chronic bilateral hydronephrosis with UPJ configuration, including  chronic moderate to severe right renal pelvic dilatation with cortical scarring and volume loss, and increased left perinephric stranding since the prior study, which may be chronic or inflammatory. 6. Mildly prominent fatty liver. 7. Hiatal hernia. 8. Aortic and coronary artery atherosclerosis. Electronically signed by: Francis Quam MD 07/11/2024 04:36 AM EST RP Workstation: [id number]    Labs:  CBC: Recent Labs    07/11/24 0300 07/11/24 1115  WBC 8.0 6.7  HGB 11.6* 11.3*  HCT 34.0* 34.4*  PLT 418* 362    COAGS: No results for input(s): INR, APTT in the last 8760 hours.  BMP: Recent Labs    07/11/24 0140 07/11/24 1115  NA 136  --   K 4.3  --   CL 99  --   CO2 20*  --   GLUCOSE 105*  --   BUN 51*  --   CALCIUM 13.0*  --   CREATININE 2.31* 1.92*  GFRNONAA 26* 33*     LIVER FUNCTION TESTS: Recent Labs    07/11/24 0140  BILITOT 0.4  AST 35  ALT 15  ALKPHOS 80  PROT 7.2  ALBUMIN 4.6    TUMOR MARKERS: No results for input(s): AFPTM, CEA, CA199, CHROMGRNA in the last 8760 hours.  Assessment and Plan: 43 y.o. female ex smoker  with past medical history significant for anemia, CHF, diabetes, hyperlipidemia, hypertension, ADHD, sleep apnea, vit D deficiency, morbid obesity with prior gastric bypass who was recently admitted to [facility] with persistent back pain, AKI, cystitis and imaging revealing:  1. Recent comminuted wedge compression fracture of the L1 vertebral body with mild paraspinal soft tissue edema and slight retropulsion, likely pathologic. 2. No nephrolithiasis or obstructing ureteral stone. 3. New expansile intramedullary lytic lesion in the posterior left 8th rib, suspicious for neoplasm (including multiple myeloma or metastatic disease), without a primary malignancy identified in the abdomen or pelvis. 4. Additional lytic lesions in the L5 vertebral body and both medial iliac crests, suspicious for metastases or myeloma. 5. Chronic bilateral hydronephrosis with UPJ configuration, including chronic moderate to severe right renal pelvic dilatation with cortical scarring and volume loss, and increased left perinephric stranding since the prior study, which may be chronic or inflammatory. 6. Mildly prominent fatty liver. 7. Hiatal hernia. 8. Aortic and coronary artery atherosclerosis   No prior history of malignancy. She is afebrile, sl tachycardic, WBC normal, hemoglobin 11.3, platelets nl, creatinine 1.92 , myeloma panel pending ;blood/urine cultures pending  Request  now received from primary team/oncology for image guided bone marrow biopsy for further evaluation.Risks and benefits of procedure was discussed with the patient including, but not limited to bleeding, infection, damage to adjacent structures or low yield  requiring additional tests.  All of the questions were answered and there is agreement to proceed.  Consent signed and in chart.  Procedure tentatively scheduled for 12/29 am  Thank you for allowing our service to participate in Kathryn [name] 's care.  Electronically Signed: D. Franky Rakers, PA-C   07/11/2024, 2:14 PM      I spent a total of  20 minutes   in face to face in clinical consultation, greater than 50% of which was counseling/coordinating care for image guided bone marrow biopsy   "

## 2024-07-11 NOTE — ED Notes (Signed)
 Spoke with Augustin at [facility] and requested transport 06:40a

## 2024-07-11 NOTE — ED Notes (Signed)
Unsuccessful IV attempt X2

## 2024-07-11 NOTE — Progress Notes (Signed)
 Notified Marsha Ada, MD via secure chat requesting patient's home medication carvedilol . Patient is yellow MEWS, yellow MEWS protocol implemented, notified CN Noe Riling, RN.   07/11/24 1843  Assess: MEWS Score  Temp (!) 97.5 F (36.4 C)  BP (!) 137/97  MAP (mmHg) 110  Pulse Rate (!) 121  SpO2 96 %  O2 Device Room Air  Assess: MEWS Score  MEWS Temp 0  MEWS Systolic 0  MEWS Pulse 2  MEWS RR 0  MEWS LOC 0  MEWS Score 2  MEWS Score Color Yellow  Assess: if the MEWS score is Yellow or Red  Were vital signs accurate and taken at a resting state? Yes  Does the patient meet 2 or more of the SIRS criteria? No  MEWS guidelines implemented  Yes, yellow  Treat  MEWS Interventions Considered administering scheduled or prn medications/treatments as ordered  Take Vital Signs  Increase Vital Sign Frequency  Yellow: Q2hr x1, continue Q4hrs until patient remains green for 12hrs  Escalate  MEWS: Escalate Yellow: Discuss with charge nurse and consider notifying provider and/or RRT  Notify: Charge Nurse/RN  Name of Charge Nurse/RN Notified Noe Riling, RN  Provider Notification  Provider Name/Title Eulas Ada, MD  Date Provider Notified 07/11/24  Time Provider Notified 1855  Method of Notification  (SECURE CHAT)  Notification Reason Change in status;Other (Comment) (yelllow MEWS d/t HR 121)  Assess: SIRS CRITERIA  SIRS Temperature  0  SIRS Respirations  0  SIRS Pulse 1  SIRS WBC 0  SIRS Score Sum  1

## 2024-07-11 NOTE — ED Notes (Signed)
 Attempted to call report, was told by secretary to call back later.

## 2024-07-11 NOTE — ED Provider Notes (Signed)
 " [place] [facility] Provider Note   CSN: [id number] Arrival date & time: 07/10/24  2121     History Chief Complaint  Patient presents with   Back Pain    HPI Kathryn [name] is a 43 y.o. female presenting for chief complaint of back pain. States that a week ago she was standing and washing dishes and felt a crack and worsening back pain immediately. Has been holding her bladder because of pain using the bathroom.  Patient's recorded medical, surgical, social, medication list and allergies were reviewed in the Snapshot window as part of the initial history.   Review of Systems   Review of Systems  Constitutional:  Positive for fatigue. Negative for chills and fever.  HENT:  Negative for ear pain and sore throat.   Eyes:  Negative for pain and visual disturbance.  Respiratory:  Negative for cough and shortness of breath.   Cardiovascular:  Negative for chest pain and palpitations.  Gastrointestinal:  Negative for abdominal pain and vomiting.  Genitourinary:  Positive for dysuria and flank pain. Negative for hematuria.  Musculoskeletal:  Negative for arthralgias and back pain.  Skin:  Negative for color change and rash.  Neurological:  Negative for seizures and syncope.  All other systems reviewed and are negative.   Physical Exam Updated Vital Signs BP (!) 161/105   Pulse (!) 112   Temp (!) 97.4 F (36.3 C)   Resp 17   Ht 5' 6 (1.676 m)   Wt (!) 145.2 kg   SpO2 96%   BMI 51.65 kg/m  Physical Exam Vitals and nursing note reviewed.  Constitutional:      General: She is not in acute distress.    Appearance: She is well-developed.  HENT:     Head: Normocephalic and atraumatic.  Eyes:     Conjunctiva/sclera: Conjunctivae normal.  Cardiovascular:     Rate and Rhythm: Normal rate and regular rhythm.     Heart sounds: No murmur heard. Pulmonary:     Effort: Pulmonary effort is normal. No respiratory distress.      Breath sounds: Normal breath sounds.  Abdominal:     Palpations: Abdomen is soft.     Tenderness: There is abdominal tenderness. There is right CVA tenderness and left CVA tenderness.  Musculoskeletal:        General: No swelling.     Cervical back: Neck supple.  Skin:    General: Skin is warm and dry.     Capillary Refill: Capillary refill takes less than 2 seconds.  Neurological:     Mental Status: She is alert.  Psychiatric:        Mood and Affect: Mood normal.      ED Course/ Medical Decision Making/ A&P    Procedures .Critical Care  Performed by: Jerral Meth, MD Authorized by: Jerral Meth, MD   Critical care provider statement:    Critical care time (minutes):  30   Critical care was necessary to treat or prevent imminent or life-threatening deterioration of the following conditions: IV narcotic titration for pain.   Critical care was time spent personally by me on the following activities:  Development of treatment plan with patient or surrogate, discussions with consultants, evaluation of patient's response to treatment, examination of patient, ordering and review of laboratory studies, ordering and review of radiographic studies, ordering and performing treatments and interventions, pulse oximetry, re-evaluation of patient's condition and review of old charts   Care discussed  with: admitting provider      Medications Ordered in ED Medications  fentaNYL  (SUBLIMAZE ) injection 100 mcg (100 mcg Intravenous Given 07/11/24 0459)  cefTRIAXone  (ROCEPHIN ) 1 g in sodium chloride  0.9 % 100 mL IVPB (0 g Intravenous Stopped 07/11/24 0406)  acetaminophen  (TYLENOL ) tablet 1,000 mg (1,000 mg Oral Given 07/11/24 0258)  cyclobenzaprine  (FLEXERIL ) tablet 10 mg (10 mg Oral Given 07/11/24 0258)  lactated ringers  bolus 1,000 mL (0 mLs Intravenous Stopped 07/11/24 0450)   Medical Decision Making:   Kathryn [name] is a 43 y.o. female who presented to the ED today  with both sided flank pain, detailed above.    Patient placed on continuous vitals and telemetry monitoring while in ED which was reviewed periodically.  Complete initial physical exam performed, notably the patient  was HDS in NAD.     Reviewed and confirmed nursing documentation for past medical history, family history, social history.    Initial Assessment:   With the patient's presentation of flank pain, most likely diagnosis is Urologic pathology (including UL vs UTI vs pyelo) vs MSK etiology. Other diagnoses were considered including (but not limited to) gastroenteritis, colitis, small bowel obstruction, appendicitis, cholecystitis, pancreatitis,  ruptured ectopic pregnancy, PID, ovarian torsion. These are considered less likely due to history of present illness and physical exam findings.   This is most consistent with an acute life/limb threatening illness complicated by underlying chronic conditions.   Initial Plan:  CBC/ Metabolic Panel  to evaluate for underlying infectious/metabolic etiology for patient's abdominal pain  Lipase to evaluate for pancreatitis  EKG to evaluate for cardiac source of pain  CT Ab/pelvis with contrast due to favored urologic over GI etiology for patient's abdominal pain  Urinalysis and repeat physical assessment to evaluate for UTI/Pyelonpehritis  Empiric management of symptoms with escalating pain control and antiemetics as needed.   Initial Study Results:   Laboratory  All laboratory results reviewed without evidence of clinically relevant pathology.   Exceptions include: AKI including creatinine/GFR disruption   EKG EKG was reviewed independently. Rate, rhythm, axis, intervals all examined and without medically relevant abnormality. ST segments without concerns for elevations.    Radiology All images reviewed independently. Agree with radiology report at this time.   CT ABDOMEN PELVIS WO CONTRAST Result Date: 07/11/2024 EXAM: CT ABDOMEN AND  PELVIS WITHOUT CONTRAST 07/11/2024 03:28:00 AM TECHNIQUE: CT of the abdomen and pelvis was performed without the administration of intravenous contrast. Multiplanar reformatted images are provided for review. Automated exposure control, iterative reconstruction, and/or weight-based adjustment of the mA/kV was utilized to reduce the radiation dose to as low as reasonably achievable. COMPARISON: CT without contrast 07/06/2019, CT with contrast 03/31/2015. CLINICAL HISTORY: Low back pain onset last night, progressively worsening since. Unsure if this is a kidney stone or urinary tract infection. She also felt a painful pop in her back last week. FINDINGS: LOWER CHEST: Mild atelectasis in the posterior lower lobes. Lung bases are clear of infiltrates with mild chronic elevation of the right diaphragm. The cardiac size is normal. There is a small hiatal hernia. There is calcification in the LAD (Left Anterior Descending) coronary artery. LIVER: The liver is enlarged, measuring 23 cm in length, and is mildly steatotic. No mass is seen without contrast. GALLBLADDER AND BILE DUCTS: There are tiny stones layering posteriorly in the gallbladder, but no wall thickening or biliary dilatation. SPLEEN: The spleen is normal in size and noncontrast attenuation. PANCREAS: The pancreas is normal in size, shape and noncontrast attenuation. ADRENAL  GLANDS: There is no adrenal mass. KIDNEYS, URETERS AND BLADDER: No renal mass is seen without contrast bilaterally. There is chronic moderate to severe dilatation in the right renal pelvis with patchy cortical scarring and volume loss of the right kidney. The right ureter is small in caliber without obstructing stone. This was seen previously, consistent with a chronic UPJ (Ureteropelvic Junction) stenosis. On the left, there is also chronic hydronephrosis ending at the UPJ but this kidney demonstrates compensatory hypertrophy without focal volume loss. There is increased left perinephric  stranding since the prior study, which could be chronic or inflammatory. There is no nephrolithiasis, ureteral or bladder stones. There are left gonadal vein phleboliths. Multiple pelvic phleboliths. Urinary bladder is unremarkable. 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 Stomach demonstrates no acute abnormality. There is no small bowel obstruction or inflammation. The appendix is normal. There is sigmoid diverticulosis without diverticulitis. There is no bowel obstruction. PERITONEUM AND RETROPERITONEUM: No ascites. No free air. No free hemorrhage. No incarcerated hernia. VASCULATURE: Aorta is normal in caliber. There is mild aortic atherosclerosis without aneurysm. LYMPH NODES: No lymphadenopathy. REPRODUCTIVE ORGANS: No acute abnormality. BONES AND SOFT TISSUES: There is a new expansile intramedullary predominantly lucent/lytic lesion with areas of cortical dehiscence involving the posterior left 8th rib measuring 5.3 x 1.5 cm, without an overt pathologic fracture.+ This is suspicious for primary or metastatic neoplasm including multiple myeloma. If this is metastatic disease, I do not see a primary source for it on this exam. In addition, there is evidence of a recent comminuted wedge compression fracture of the L1 vertebral body, with fragmentation centrally and mild edema in the paraspinal soft tissues without paraspinal hematoma. Loss of vertebral body height is 30% anteriorly, up to 70% centrally, and about 10% posteriorly with slight posterior superior retropulsion. I do not see a soft tissue component to this infiltrating the spinal canal. I suspect this was probably a pathologic fracture. There is a somewhat lucent appearance in the central L1 body. The L5 body demonstrates several small lytic lesions. There are lytic lesions in both medial iliac crests measuring 2.5 x 1 cm on the right and 1.2  cm on the left. No other focal bone lesion is seen. No focal soft tissue abnormality. IMPRESSION: 1. Recent comminuted wedge compression fracture of the L1 vertebral body with mild paraspinal soft tissue edema and slight retropulsion, likely pathologic. 2. No nephrolithiasis or obstructing ureteral stone. 3. New expansile intramedullary lytic lesion in the posterior left 8th rib, suspicious for neoplasm (including multiple myeloma or metastatic disease), without a primary malignancy identified in the abdomen or pelvis. 4. Additional lytic lesions in the L5 vertebral body and both medial iliac crests, suspicious for metastases or myeloma. 5. Chronic bilateral hydronephrosis with UPJ configuration, including chronic moderate to severe right renal pelvic dilatation with cortical scarring and volume loss, and increased left perinephric stranding since the prior study, which may be chronic or inflammatory. 6. Mildly prominent fatty liver. 7. Hiatal hernia. 8. Aortic and coronary artery atherosclerosis. Electronically signed by: Francis Quam MD 07/11/2024 04:36 AM EST RP Workstation: [id number]    Final Reassessment and Plan:   Patient with complex diagnosis today. She has first pyelonephritis consistent with perinephric stranding plus the findings on her UA as well as an AKI.  She was treated with IV fluids, Rocephin  and blood/urine cultures were sent to lab. She required admission for the management  of the AKI. Additionally, she was found to have diffuse lytic lesions concerning for myeloma versus neoplasm.  No primary identified on my CT abdomen pelvis.  Discussed this with hospitalist that she would need further diagnostic evaluation.  They are in agreement with admission for further care and management. Disposition:   Based on the above findings, I believe this patient is stable for admission.    Patient/family educated about specific findings on our evaluation and explained exact reasons for admission.   Patient/family educated about clinical situation and time was allowed to answer questions.   Admission team communicated with and agreed with need for admission. Patient admitted. Patient ready to move at this time.     Emergency Department Medication Summary:   Medications  fentaNYL  (SUBLIMAZE ) injection 100 mcg (100 mcg Intravenous Given 07/11/24 0459)  cefTRIAXone  (ROCEPHIN ) 1 g in sodium chloride  0.9 % 100 mL IVPB (0 g Intravenous Stopped 07/11/24 0406)  acetaminophen  (TYLENOL ) tablet 1,000 mg (1,000 mg Oral Given 07/11/24 0258)  cyclobenzaprine  (FLEXERIL ) tablet 10 mg (10 mg Oral Given 07/11/24 0258)  lactated ringers  bolus 1,000 mL (0 mLs Intravenous Stopped 07/11/24 0450)        Admit   Final Clinical Impression(s) / ED Diagnoses Final diagnoses:  Acute low back pain without sciatica, unspecified back pain laterality  Pyelonephritis    Rx / DC Orders ED Discharge Orders     None         Jerral Meth, MD 07/11/24 [phone]  "

## 2024-07-11 NOTE — H&P (Addendum)
 " History and Physical    Patient: Kathryn [name] FMW:[id number] DOB: March 01, 1981 DOA: 07/11/2024 DOS: the patient was seen and examined on 07/11/2024 PCP: Gerome Brunet, DO  Patient coming from: Home  Chief Complaint:  Chief Complaint  Patient presents with   Back Pain   HPI: Kathryn [name] is a 43 y.o. female with medical history significant of HFpEF (EF 65-70% in 2015), HTN, HLD, DM2, OSA (not on CPAP), and morbid obesity (BMI 51) who p/w back pain and found to have L1 compression fracture as well as multiple lytic lesions c/f MM or metastasis.  The patient reported experiencing lower back pain approximately one and a half to two weeks ago, which she initially did not make much of because it coincided with the mid-cycle of their menstrual period. The patient noted that this type of pain/cramping is typical for them during this time. However, while standing and washing dishes, the patient heard a pop and felt a crackling sensation in the back, accompanied by the worst pain they had ever experienced. This intense pain caused the patient to scream and made it difficult to move. The patient managed to limp to the bed and lay down, drifting off to sleep due to the severity of the pain. The following day, the pain seemed to ease slightly, and the patient speculated that the pain might have been due to an awkward movement or sleeping position. The patient was eventually able to make it home and used ibuprofen  200mg  QID as well as Icy Hot to treat her pain while she worked the past week; unfortunately, after Christmas the pain worsened, and the aforementioned medicines no longer provided relief; thus, she presented to the ED for evaluation.  In the ED she was afebrile, tachycardic, and hypertensive. CBC unremarkable except for mild anemia. CMP notable for AKI with creatinine of 2.31 (baseline 0.8-0.9). Troponin 66. UA with only small leukocyte esterase. CT abdomen and pelvis with  multiple concerning findings of L1 compression fracture, multiple lytic lesions suspicious for metastasis or myeloma chronic bilateral hydronephrosis, left perinephric stranding, chronic or inflammatory. OSH EDP [facility]) started IV Rocephin , IV NS bolus, and requested transfer for PCU admission. Multimodal pain treatment with fentanyl , cyclobenzaprine , acetaminophen .    Review of Systems: As mentioned in the history of present illness. All other systems reviewed and are negative. 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   Social History:  reports that she quit smoking about 23 years ago. Her smoking use included cigarettes. She started smoking about 24 years ago. She has a 0.3 pack-year smoking history. She has never used smokeless tobacco. She reports current alcohol use. She reports that she does not use drugs.  Allergies[1]  History reviewed. No pertinent  family history.  Prior to Admission medications  Medication Sig Start Date End Date Taking? Authorizing Provider  lisdexamfetamine  (VYVANSE ) 40 MG capsule Take 40 mg by mouth every morning. 06/18/24  Yes [provider]  carvedilol  (COREG ) 12.5 MG tablet Take 12.5 mg by mouth daily after breakfast.     [provider]  Cholecalciferol (VITAMIN D -3) 25 MCG (1000 UT) CAPS Take 5,000 Units by mouth daily.     [provider]  esomeprazole (NEXIUM) 20 MG capsule Take 20 mg by mouth daily before breakfast.    [provider]  famotidine  (PEPCID ) 20 MG tablet Take 20 mg by mouth daily after breakfast.    [provider]  furosemide  (LASIX ) 40 MG tablet Take 40 mg by mouth daily after breakfast.    [provider]  HYDROcodone -acetaminophen  (NORCO/VICODIN) 5-325 MG tablet Take 1-2 tablets by mouth every 6 (six) hours as needed. 07/06/19   Odell Balls, PA-C  ibuprofen  (ADVIL ) 600 MG tablet Take 1 tablet (600 mg total) by mouth every 6 (six) hours as needed. 07/06/19   Odell Balls, PA-C  losartan -hydrochlorothiazide  (HYZAAR) 100-12.5 MG tablet Take 1 tablet by mouth daily after breakfast.    [provider]  methocarbamol  (ROBAXIN ) 500 MG tablet Take 1 tablet (500 mg total) by mouth 2 (two) times daily. 07/06/19   Odell Balls, PA-C  Multiple Vitamin (MULTIVITAMIN WITH MINERALS) TABS tablet Take 1 tablet by mouth daily.    [provider]  valACYclovir (VALTREX) 500 MG tablet Take 500 mg by mouth daily.    [provider]    Physical Exam: Vitals:   07/11/24 0500 07/11/24 0530 07/11/24 0700 07/11/24 0846  BP: (!) 147/96 (!) 161/105 (!) 154/111 (!) 162/96  Pulse: (!) 112 (!) 112 (!) 111 (!) 109  Resp: 16 17 18 18   Temp:    97.9 F (36.6 C)  TempSrc:    Oral  SpO2: 97% 96% 96% 100%  Weight:      Height:       General: Alert, oriented x3, resting comfortably in no acute distress Respiratory: Lungs clear to auscultation bilaterally with normal respiratory effort; no w/r/r Cardiovascular: Regular rate and rhythm w/o m/r/g Abdomen: Soft, nontender, nondistended. Positive bowel sounds   Data Reviewed:  Lab Results  Component Value Date   WBC 8.0 07/11/2024   HGB 11.6 (L) 07/11/2024   HCT 34.0 (L) 07/11/2024   MCV 92.9 07/11/2024   PLT 418 (H) 07/11/2024   Lab Results  Component Value Date   GLUCOSE 105 (H) 07/11/2024    CALCIUM 13.0 (H) 07/11/2024   NA 136 07/11/2024   K 4.3 07/11/2024   CO2 20 (L) 07/11/2024   CL 99 07/11/2024   BUN 51 (H) 07/11/2024   CREATININE 2.31 (H) 07/11/2024   Lab Results  Component Value Date   ALT 15 07/11/2024   AST 35 07/11/2024   ALKPHOS 80 07/11/2024   BILITOT 0.4 07/11/2024   No results found for: INR, PROTIME Radiology: CT ABDOMEN PELVIS WO CONTRAST Result Date: 07/11/2024 EXAM: CT ABDOMEN AND PELVIS WITHOUT CONTRAST 07/11/2024 03:28:00 AM TECHNIQUE: CT of the abdomen and pelvis was performed without the administration of intravenous contrast. Multiplanar reformatted images are provided for review. Automated exposure control, iterative reconstruction, and/or weight-based adjustment of the mA/kV was utilized to reduce the radiation dose to as low as reasonably achievable. COMPARISON: CT without contrast 07/06/2019, CT with contrast 03/31/2015. CLINICAL HISTORY: Low back pain onset last night, progressively worsening since. Unsure if this  is a kidney stone or urinary tract infection. She also felt a painful pop in her back last week. FINDINGS: LOWER CHEST: Mild atelectasis in the posterior lower lobes. Lung bases are clear of infiltrates with mild chronic elevation of the right diaphragm. The cardiac size is normal. There is a small hiatal hernia. There is calcification in the LAD (Left Anterior Descending) coronary artery. LIVER: The liver is enlarged, measuring 23 cm in length, and is mildly steatotic. No mass is seen without contrast. GALLBLADDER AND BILE DUCTS: There are tiny stones layering posteriorly in the gallbladder, but no wall thickening or biliary dilatation. SPLEEN: The spleen is normal in size and noncontrast attenuation. PANCREAS: The pancreas is normal in size, shape and noncontrast attenuation. ADRENAL GLANDS: There is no adrenal mass. KIDNEYS, URETERS AND BLADDER: No renal mass is seen without contrast bilaterally. There is chronic moderate to severe  dilatation in the right renal pelvis with patchy cortical scarring and volume loss of the right kidney. The right ureter is small in caliber without obstructing stone. This was seen previously, consistent with a chronic UPJ (Ureteropelvic Junction) stenosis. On the left, there is also chronic hydronephrosis ending at the UPJ but this kidney demonstrates compensatory hypertrophy without focal volume loss. There is increased left perinephric stranding since the prior study, which could be chronic or inflammatory. There is no nephrolithiasis, ureteral or bladder stones. There are left gonadal vein phleboliths. Multiple pelvic phleboliths. Urinary bladder is unremarkable. 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 Stomach demonstrates no acute abnormality. There is no small bowel obstruction or inflammation. The appendix is normal. There is sigmoid diverticulosis without diverticulitis. There is no bowel obstruction. PERITONEUM AND RETROPERITONEUM: No ascites. No free air. No free hemorrhage. No incarcerated hernia. VASCULATURE: Aorta is normal in caliber. There is mild aortic atherosclerosis without aneurysm. LYMPH NODES: No lymphadenopathy. REPRODUCTIVE ORGANS: No acute abnormality. BONES AND SOFT TISSUES: There is a new expansile intramedullary predominantly lucent/lytic lesion with areas of cortical dehiscence involving the posterior left 8th rib measuring 5.3 x 1.5 cm, without an overt pathologic fracture.+ This is suspicious for primary or metastatic neoplasm including multiple myeloma. If this is metastatic disease, I do not see a primary source for it on this exam. In addition, there is evidence of a recent comminuted wedge compression fracture of the L1 vertebral body, with fragmentation centrally and mild edema in the paraspinal soft tissues without paraspinal hematoma. Loss of vertebral body height is  30% anteriorly, up to 70% centrally, and about 10% posteriorly with slight posterior superior retropulsion. I do not see a soft tissue component to this infiltrating the spinal canal. I suspect this was probably a pathologic fracture. There is a somewhat lucent appearance in the central L1 body. The L5 body demonstrates several small lytic lesions. There are lytic lesions in both medial iliac crests measuring 2.5 x 1 cm on the right and 1.2 cm on the left. No other focal bone lesion is seen. No focal soft tissue abnormality. IMPRESSION: 1. Recent comminuted wedge compression fracture of the L1 vertebral body with mild paraspinal soft tissue edema and slight retropulsion, likely pathologic. 2. No nephrolithiasis or obstructing ureteral stone. 3. New expansile intramedullary lytic lesion in the posterior left 8th rib, suspicious for neoplasm (including multiple myeloma or metastatic disease), without a primary malignancy identified in the abdomen or pelvis. 4. Additional lytic lesions in the L5 vertebral body and  both medial iliac crests, suspicious for metastases or myeloma. 5. Chronic bilateral hydronephrosis with UPJ configuration, including chronic moderate to severe right renal pelvic dilatation with cortical scarring and volume loss, and increased left perinephric stranding since the prior study, which may be chronic or inflammatory. 6. Mildly prominent fatty liver. 7. Hiatal hernia. 8. Aortic and coronary artery atherosclerosis. Electronically signed by: Francis Quam MD 07/11/2024 04:36 AM EST RP Workstation: [id number]    Assessment and Plan: 6F h/o ADHD, HFpEF (EF 65-70% in 2015), HTN, HLD, DM2, OSA (not on CPAP), and morbid obesity (BMI 51) who p/w back pain and found to have L1 compression fracture as well as multiple lytic lesions c/f MM or metastasis.  Pathologic L1 compression fracture Possible posterior 8th rib fracture Fragility fracture Multiple lytic lesions including L5 vertebral body and  L posterior 8th rib c/f MM or metastasis -Oncology consulted; apprec eval/recs -IR consulted for bone marrow biopsy/aspiration; apprec eval/recs -Multimodal pain control with tylenol , gabapentin , robaxin , and oxycodone  prn -F/u 25-OH vitamin D  and treat if low w/ ergochalciferol 50,000 IU weekly for 8-12 weeks; pt will need 25-OH vitamin D  re-testing as outpt to ensure repletion in 8-12 weeks; may consider ergochalciferol 2000 IU daily after testing in 8-12 weeks for maintenance therapy -Consider bisphosphonate or Prolia at d/c and OP PCP f/u for DEXA scan  AKI -MIVF: NS at 50cc/h for 24h -Strict I&Os and daily weights (standing preferred) -F/u BMP daily -Renally dose medications for CrCl -Avoid lovenox , NSAIDs, morphine , Fleet's phosphate enema, regular insulin , contrast; no gadolinium for MRI to avoid nephrogenic systemic fibrosis -Consider renal US  and nephrology consult if worsening AKI  Acute cystitis UA w/ bacteruria and polyuria -IV CTX 1g daily for now -F/u urine culture  Chronic b/l hydronephrosis -Urology consulted; apprec eval/recs -Foley catheter deferred given presumed UTI  HFpEF -PTA Coreg  12.5mg  BID, and PO lasix  40mg  daily  HTN -PTA losartan  100mg  daily -HOLD pta hydrochlorothiazide  given pt on lasix  concurrently  ADHD -PTA Vyvanse    Advance Care Planning:   Code Status: Full Code   Consults: IR, Urology, and Oncology  Family Communication: [name], friend   Severity of Illness: The appropriate patient status for this patient is INPATIENT. Inpatient status is judged to be reasonable and necessary in order to provide the required intensity of service to ensure the patient's safety. The patient's presenting symptoms, physical exam findings, and initial radiographic and laboratory data in the context of their chronic comorbidities is felt to place them at high risk for further clinical deterioration. Furthermore, it is not anticipated that the patient  will be medically stable for discharge from the hospital within 2 midnights of admission.   * I certify that at the point of admission it is my clinical judgment that the patient will require inpatient hospital care spanning beyond 2 midnights from the point of admission due to high intensity of service, high risk for further deterioration and high frequency of surveillance required.*   ------- I spent 55 minutes reviewing previous notes, at the bedside counseling/discussing the treatment plan, and performing clinical documentation.  Author: Marsha Ada, MD 07/11/2024 9:44 AM  For on call review [email or web address].      [1]  Allergies Allergen Reactions   Pravastatin Other (See Comments)    Got pancreatitis   Floxin [Ofloxacin] Swelling    Side of face corresponding to ear being treated swelled after usage. Ok with po quinolones   Lisinopril  Swelling   Sulfa Antibiotics Hives   "

## 2024-07-12 ENCOUNTER — Inpatient Hospital Stay (HOSPITAL_COMMUNITY)

## 2024-07-12 DIAGNOSIS — I10 Essential (primary) hypertension: Secondary | ICD-10-CM | POA: Diagnosis not present

## 2024-07-12 DIAGNOSIS — R7989 Other specified abnormal findings of blood chemistry: Secondary | ICD-10-CM | POA: Diagnosis not present

## 2024-07-12 LAB — CBC
HCT: 31.6 % — ABNORMAL LOW (ref 36.0–46.0)
Hemoglobin: 10.4 g/dL — ABNORMAL LOW (ref 12.0–15.0)
MCH: 31.7 pg (ref 26.0–34.0)
MCHC: 32.9 g/dL (ref 30.0–36.0)
MCV: 96.3 fL (ref 80.0–100.0)
Platelets: 368 K/uL (ref 150–400)
RBC: 3.28 MIL/uL — ABNORMAL LOW (ref 3.87–5.11)
RDW: 14.5 % (ref 11.5–15.5)
WBC: 6.6 K/uL (ref 4.0–10.5)
nRBC: 0 % (ref 0.0–0.2)

## 2024-07-12 LAB — COMPREHENSIVE METABOLIC PANEL WITH GFR
ALT: 12 U/L (ref 0–44)
AST: 25 U/L (ref 15–41)
Albumin: 3.9 g/dL (ref 3.5–5.0)
Alkaline Phosphatase: 66 U/L (ref 38–126)
Anion gap: 11 (ref 5–15)
BUN: 51 mg/dL — ABNORMAL HIGH (ref 6–20)
CO2: 19 mmol/L — ABNORMAL LOW (ref 22–32)
Calcium: 12.5 mg/dL — ABNORMAL HIGH (ref 8.9–10.3)
Chloride: 104 mmol/L (ref 98–111)
Creatinine, Ser: 2.05 mg/dL — ABNORMAL HIGH (ref 0.44–1.00)
GFR, Estimated: 30 mL/min — ABNORMAL LOW
Glucose, Bld: 105 mg/dL — ABNORMAL HIGH (ref 70–99)
Potassium: 4.2 mmol/L (ref 3.5–5.1)
Sodium: 135 mmol/L (ref 135–145)
Total Bilirubin: 0.2 mg/dL (ref 0.0–1.2)
Total Protein: 6.2 g/dL — ABNORMAL LOW (ref 6.5–8.1)

## 2024-07-12 LAB — URINE CULTURE: Culture: <10000 — AB

## 2024-07-12 LAB — TROPONIN T, HIGH SENSITIVITY: Troponin T High Sensitivity: 74 ng/L — ABNORMAL HIGH (ref 0–19)

## 2024-07-12 MED ORDER — CARVEDILOL 6.25 MG PO TABS: 6.2500 mg | ORAL_TABLET | Freq: Two times a day (BID) | ORAL | Status: DC

## 2024-07-12 MED ORDER — CARVEDILOL 12.5 MG PO TABS
12.5000 mg | ORAL_TABLET | Freq: Two times a day (BID) | ORAL | Status: DC
  Administered 2024-07-12 – 2024-07-15 (×6): 12.5 mg via ORAL
  Filled 2024-07-12 (×3): qty 1

## 2024-07-12 NOTE — Progress Notes (Signed)
" °  Echocardiogram 2D Echocardiogram has been performed.  LAMON MAXWELL 07/12/2024, 5:06 PM "

## 2024-07-12 NOTE — Progress Notes (Signed)
 " PROGRESS NOTE Kathryn [name]  FMW:[id number] DOB: Nov 25, 1980 DOA: 07/11/2024 PCP: Gerome Brunet, DO  Brief Narrative/Hospital Course: Kathryn [name] is a 43 y.o. female with PMH of HFpEF (EF 65-70% in 2015), HTN, HLD, DM2, OSA (not on CPAP), and morbid obesity (BMI 51) who presented w/ back pain and found to have L1 compression fracture as well as multiple lytic lesions consistent with MM or metastasis. Presented to the ED 12/26 with lower back pain onset ~ 1-2 wks ago,coincided with the mid-cycle of their menstrual period. While standing and washing dishes, the patient heard a pop and felt a crackling sensation in the back, accompanied by the worst pain they had ever experienced. In the ED : afebrile, tachycardic, and hypertensive.  Labs-mild anemia AKI with creat of 2.31 (baseline 0.8-0.9). Troponin 66. UA with only small leukocyte esterase. CT abdomen and pelvis>>multiple concerning findings of L1 compression fracture, multiple lytic lesions suspicious for metastasis or myeloma chronic bilateral hydronephrosis, left perinephric stranding, chronic or inflammatory. At [facility] ED started IV Rocephin , IV NS bolus, and admitted to [facility]  Urology, hematology oncology and IR consulted  Subjective: Seen and examined today Aaox3, pain improving able ot mobile better today Overnight afebrile, tachycardic 110, on room air BP fairly stable Labs-creatinine about the same 2 bicarb 19 LFTs and potassium stable, CBC with anemia stable   Assessment and plan:  Pathologic L1 compression fracture Posterior left atrial lesions/multiple scattered lytic lesions: Oncology following finding are concerning for multiple myeloma versus metastatic disease.  CT of the chest for staging  pending, Multiple myeloma panel sent with SPEP UPEP serum free light chain BM  biopsy planned for 12/29 -IR following Continue pain management supportive care, follow-up vitamin level Further treatment plan  per oncology  Chronic bilateral hydronephrosis Left perinephric stranding Bilateral UPJ obstruction Atrophic right kidney: Seen by urology finding are chronic with preservation of parenchyma in the left kidney indicating low-grade obstruction if any at all.  Creatinine is elevated, no further urological intervention at this time but to manage AKI along with workup of lytic lesions possible myeloma, urine culture unremarkable, and empiric antibiotics complete days x 3  Hypercalcemia: Likely in the setting of multiple myeloma/lytic lesion.  Calcium 13>13, check intact PTH, ionized calcium Recent Labs  Lab 07/11/24 0140 07/12/24 0814  K 4.3 4.2  CALCIUM 13.0* 12.5*    No results found for: PTH   AKI Metabolic acidosis: Suspect multifactorial primarily in the setting of multiple myeloma, volume depletion.  Continue gentle IV fluid hydration.  Hold losartan  Lasix , monitor renal function Recent Labs    07/11/24 0140 07/11/24 1115 07/12/24 0814  BUN 51*  --  51*  CREATININE 2.31* 1.92* 2.05*  CO2 20*  --  19*  K 4.3  --  4.2    Mildly prominent fatty liver Hiatal hernia: Continue PPI.  LFTs fairly stable.  HFpEF ON Coreg  12.5mg  BID, and PO lasix  40mg  -decrease the dose of Coreg  hold losartan  and Lasix    Elevated troponin at 66 on admission: Recheck troponin    HTN: On losartan  HCTZ will hold given AKI soft BP    ADHD PTA Vyvanse .  Morbid Obesity w/ Body mass index is 51.65 kg/m.: Will benefit with PCP follow-up, weight loss,healthy lifestyle.  DVT prophylaxis:  Code Status:   Code Status: Full Code Family Communication: plan of care discussed with patient at bedside. Patient status is: Remains hospitalized because of severity of illness Level of care: Telemetry   Dispo: The  patient is from: HOME            Anticipated disposition: TBD Objective: Vitals last 24 hrs: Vitals:   07/11/24 2100 07/12/24 0025 07/12/24 0414 07/12/24 0843  BP: (!) 135/90 127/81 (!)  119/94 111/77  Pulse: (!) 130 (!) 127 (!) 119 (!) 109  Resp: 18 17 17 17   Temp: (!) 97.3 F (36.3 C) 98.7 F (37.1 C) 97.9 F (36.6 C) 97.9 F (36.6 C)  TempSrc: Oral Oral Oral Oral  SpO2: 98% 93% 99% 99%  Weight:      Height:       Physical Examination: General exam: alert awake, oriented, older than stated age HEENT:Oral mucosa moist, Ear/Nose WNL grossly Respiratory system: Bilaterally clear BS,no use of accessory muscle Cardiovascular system: S1 & S2 +, No JVD. Gastrointestinal system: Abdomen soft,NT,ND, BS+ Nervous System: Alert, awake, moving all extremities,and following commands. Extremities: extremities warm, leg edema NEG Skin: Warm, no rashes MSK: Normal muscle bulk,tone, power   Medications reviewed:  Scheduled Meds:  acetaminophen   1,000 mg Oral TID   carvedilol   6.25 mg Oral BID WC   enoxaparin  (LOVENOX ) injection  70 mg Subcutaneous Q24H   gabapentin   300 mg Oral TID   lisdexamfetamine   40 mg Oral Daily   methocarbamol   500 mg Oral TID   multivitamin with minerals  1 tablet Oral Daily   pantoprazole   40 mg Oral Daily   Continuous Infusions:  sodium chloride  100 mL/hr at 07/12/24 0122   cefTRIAXone  (ROCEPHIN )  IV 1 g (07/12/24 0901)   Diet: Diet Order             Diet NPO time specified Except for: Sips with Meds  Diet effective midnight           Diet regular Room service appropriate? Yes; Fluid consistency: Thin  Diet effective now                   Data Reviewed: I have personally reviewed following labs and imaging studies ( see epic result tab) CBC: Recent Labs  Lab 07/11/24 0300 07/11/24 1115 07/12/24 0814  WBC 8.0 6.7 6.6  NEUTROABS 5.5  --   --   HGB 11.6* 11.3* 10.4*  HCT 34.0* 34.4* 31.6*  MCV 92.9 96.6 96.3  PLT 418* 362 368   CMP: Recent Labs  Lab 07/11/24 0140 07/11/24 1115 07/12/24 0814  NA 136  --  135  K 4.3  --  4.2  CL 99  --  104  CO2 20*  --  19*  GLUCOSE 105*  --  105*  BUN 51*  --  51*  CREATININE 2.31*  1.92* 2.05*  CALCIUM 13.0*  --  12.5*   GFR: Estimated Creatinine Clearance: 52.3 mL/min (A) (by C-G formula based on SCr of 2.05 mg/dL (H)). Recent Labs  Lab 07/11/24 0140 07/12/24 0814  AST 35 25  ALT 15 12  ALKPHOS 80 66  BILITOT 0.4 0.2  PROT 7.2 6.2*  ALBUMIN 4.6 3.9   No results for input(s): LIPASE, AMYLASE in the last 168 hours. No results for input(s): AMMONIA in the last 168 hours. Coagulation Profile: No results for input(s): INR, PROTIME in the last 168 hours. Unresulted Labs (From admission, onward)     Start     Ordered   07/18/24 0500  Creatinine, serum  (enoxaparin  (LOVENOX )    CrCl >/= 30 ml/min)  Weekly,   R     Comments: while on enoxaparin  therapy  07/11/24 0928   07/13/24 0500  CBC with Differential/Platelet  Once,   R        07/11/24 1426   07/13/24 0500  Basic metabolic panel with GFR  Daily,   R      07/12/24 0924   07/13/24 0500  CBC  Daily,   R      07/12/24 0924   07/12/24 0925  PTH, intact and calcium  Once,   R        07/12/24 0924   07/11/24 1001  Multiple Myeloma Panel (SPEP&IFE w/QIG)  Once,   R        07/11/24 1000   07/11/24 1001  Kappa/lambda light chains  Once,   R        07/11/24 1000   07/11/24 1001  UPEP/UIFE/Light Chains/TP, 24-Hr Ur  Once,   R        07/11/24 1000   07/11/24 1001  Beta 2 microglobulin, serum  Once,   R        07/11/24 1000   07/11/24 0949  Urinalysis, w/ Reflex to Culture (Infection Suspected) -Urine, Catheterized  (Urine Labs)  Once,   R       Question:  Specimen Source  Answer:  Urine, Catheterized   07/11/24 0948   07/11/24 0100  CBC with Differential  Once,   STAT        07/11/24 0102           Antimicrobials/Microbiology: Anti-infectives (From admission, onward)    Start     Dose/Rate Route Frequency Ordered Stop   07/12/24 1000  cefTRIAXone  (ROCEPHIN ) 1 g in sodium chloride  0.9 % 100 mL IVPB        1 g 200 mL/hr over 30 Minutes Intravenous Every 24 hours 07/11/24 0948 07/16/24 0959    07/11/24 0115  cefTRIAXone  (ROCEPHIN ) 1 g in sodium chloride  0.9 % 100 mL IVPB        1 g 200 mL/hr over 30 Minutes Intravenous  Once 07/11/24 0102 07/11/24 0406         Component Value Date/Time   SDES  07/11/2024 0606    BLOOD LEFT ANTECUBITAL Performed at [facility], [address] [place], [place] [address]    [facility]  07/11/2024 0606    BOTTLES DRAWN AEROBIC AND ANAEROBIC Blood Culture adequate volume Performed at [facility], [address] [place], [place] [address]    CULT  07/11/2024 0606    NO GROWTH 1 DAY Performed at [facility], [address], [place], [place] [address]    REPTSTATUS PENDING 07/11/2024 9393    Procedures:  Mennie LAMY, MD [facility] 07/12/2024, 12:12 PM   "

## 2024-07-12 NOTE — Plan of Care (Signed)

## 2024-07-12 NOTE — Plan of Care (Signed)
" °  Problem: Education: Goal: Knowledge of General Education information will improve Description: Including pain rating scale, medication(s)/side effects and non-pharmacologic comfort measures Outcome: Progressing   Problem: Health Behavior/Discharge Planning: Goal: Ability to manage health-related needs will improve Outcome: Progressing   Problem: Activity: Goal: Risk for activity intolerance will decrease Outcome: Progressing   Problem: Nutrition: Goal: Adequate nutrition will be maintained Outcome: Progressing   Problem: Elimination: Goal: Will not experience complications related to urinary retention Outcome: Progressing   Problem: Safety: Goal: Ability to remain free from injury will improve Outcome: Progressing   "

## 2024-07-12 NOTE — Hospital Course (Addendum)
 Kathryn [name] is a 43 y.o. female with PMH of HFpEF (EF 65-70% in 2015), HTN, HLD, DM2, OSA (not on CPAP), and morbid obesity (BMI 51) who presented w/ back pain and found to have L1 compression fracture as well as multiple lytic lesions consistent with MM or metastasis. Presented to the ED 12/26 with lower back pain onset ~ 1-2 wks ago,coincided with the mid-cycle of their menstrual period. While standing and washing dishes, the patient heard a pop and felt a crackling sensation in the back, accompanied by the worst pain they had ever experienced. In the ED : afebrile, tachycardic, and hypertensive.  Labs-mild anemia AKI with creat of 2.31 (baseline 0.8-0.9). Troponin 66. UA with only small leukocyte esterase. CT abdomen and pelvis>>multiple concerning findings of L1 compression fracture, multiple lytic lesions suspicious for metastasis or myeloma chronic bilateral hydronephrosis, left perinephric stranding, chronic or inflammatory. At [facility] ED started IV Rocephin , IV NS bolus, and admitted to [facility]  Urology, hematology oncology and IR consulted-underwent bone marrow biopsy 12/29. Due to persistent hypercalcemia, given Zometa  12/13 by hematology oncology They are planning for outpatient follow-up for biopsy result and further management Patient has been cleared for discharge from oncology and they will plan on outpatient follow-up  Subjective: Seen and examined Overall she feels much improved, pain is controlled, eager to go home today Overnight febrile heart rate 105-116, BP stable on room air, labs/calcium improved to 11.8 mag phos stable, creat further improved 1.2  Discharge Diagnoses:   Pathologic L1 compression fracture Multiple lytic lesions Acute low back pain: Oncology following finding are concerning for multiple myeloma versus metastatic disease.  CT of the chest for staging completed 12/27-expansile lytic lesions of the right first rib and right fourth rib sternum  left eighth rib T1 vertebral body with pathologic fractures and mild loss of height. Multiple myeloma panel,SPEP UPEP, serum free light chain pending. Beta2 microglobulin elevated ~ 10. BM  biopsy done 12/29-results pending.  Continue multimodal pain management with prn oxycodone , Robaxin  Tylenol  1 g tid,gabapentin  . PT OT to eval-see lives in extended stay facility/home. Due to persistent hypercalcemia, given Zometa  12/13 by hematology oncology They are planning for outpatient follow-up for biopsy result and further management  Hypercalcemia 13: Suspect due to patient's skeletal finding with lytic lesions.Calcium improving > 11.8, iPTH < 6 suppressed appropriately S/P Zometa  12/30.  Calcium improving encourage oral hydration, remained on IV fluid hydration while inpatient-follow-up with hem onc with labs Hold vitamin D  supplementation.. Recent Labs  Lab 07/11/24 0140 07/12/24 0814 07/12/24 1059 07/13/24 0714 07/14/24 0521 07/15/24 0543  K 4.3 4.2  --  4.1 4.4 4.2  CALCIUM 13.0* 12.5* 12.2* 12.1* 12.2* 11.8*  MG  --   --   --   --   --  1.8  PHOS  --   --   --   --   --  3.6       Component Value Date/Time   PTH <6 (L) 07/12/2024 1059   PTH Comment 07/12/2024 1059   Chronic bilateral hydronephrosis Left perinephric stranding Bilateral UPJ obstruction Atrophic right kidney: Seen by urology finding are chronic with preservation of parenchyma in the left kidney indicating low-grade obstruction if any. No urological intervention planned at this time.  Creatinine is nicely improving indicating AKI in the setting of hypercalcemia, volume depletion  Urine culture unremarkable,  recheck UA showed large pyuria more than 50 bacteria and large LE she will compelted 7 days of antibiotics   AKI  Metabolic acidosis: Suspect multifactorial primarily in the setting of lytic lesions ?multiple myeloma, volume depletion.  Overall much improved 1.2 Likely has component of CKD-needs to be seen how  much creatinine will improve.  Encourage p.o. intake.  Holding her Lasix  and Hyzaar Recent Labs    07/11/24 0140 07/11/24 1115 07/12/24 0814 07/13/24 0714 07/14/24 0521 07/15/24 0543  BUN 51*  --  51* 46* 36* 29*  CREATININE 2.31* 1.92* 2.05* 1.72* 1.45* 1.29*  CO2 20*  --  19* 20* 22 21*  K 4.3  --  4.2 4.1 4.4 4.2    Mildly prominent fatty liver Hiatal hernia: Continue PPI.  LFTs fairly stable.  HFpEF Continue home Coreg  dose, holding LasiX , Losartan   Elevated troponin at 66 on admission: Recheck troponin elevated but flat-2D echo obtained with normal EF no RWMA   HTN: Stable.  PTA losartan  HCTZ and Lasix  on hold.  Continue Coreg    ADHD PTA Vyvanse .  Morbid Obesity w/ Body mass index is 51.65 kg/m.: Will benefit with PCP follow-up, weight loss,healthy lifestyle.  Deconditioning debility back pain due to above: Mobility: PT Follow up Rec: Home Health Pt12/30/2025 1328    DVT prophylaxis:  Code Status:   Code Status: Full Code Family Communication: plan of care discussed with patient at bedside. Patient status is: Remains hospitalized because of severity of illness Level of care: Telemetry   Dispo: The patient is from: HOME            Anticipated disposition: home w/ HH  Objective: Vitals last 24 hrs: Vitals:   07/14/24 1818 07/14/24 2032 07/15/24 0614 07/15/24 0904  BP: (!) 143/76 123/67 132/89 (!) 146/96  Pulse: (!) 116 (!) 125 (!) 105 (!) 116  Resp:  17 17 17   Temp:  97.9 F (36.6 C) 97.9 F (36.6 C) 98.1 F (36.7 C)  TempSrc:    Oral  SpO2:  97% 99% 97%  Weight:      Height:       Physical Examination: General exam: AAOX3 HEENT:Oral mucosa moist, Ear/Nose WNL grossly Respiratory system: Bilaterally clear. Cardiovascular system: S1 & S2 +, No JVD. Gastrointestinal system: Abdomen soft, obese NT,ND, BS+ Nervous System: Alert, awake, moving all extremities non focal Extremities: extremities warm, leg edema NEG Skin: Warm, no rashes MSK: Normal  muscle bulk,tone, power

## 2024-07-13 ENCOUNTER — Inpatient Hospital Stay (HOSPITAL_COMMUNITY)

## 2024-07-13 DIAGNOSIS — M8448XA Pathological fracture, other site, initial encounter for fracture: Secondary | ICD-10-CM | POA: Diagnosis not present

## 2024-07-13 HISTORY — PX: IR BONE MARROW BIOPSY: IMG5733

## 2024-07-13 LAB — CBC WITH DIFFERENTIAL/PLATELET
Abs Immature Granulocytes: 0.03 K/uL (ref 0.00–0.07)
Basophils Absolute: 0 K/uL (ref 0.0–0.1)
Basophils Relative: 0 %
Eosinophils Absolute: 0.2 K/uL (ref 0.0–0.5)
Eosinophils Relative: 4 %
HCT: 31.1 % — ABNORMAL LOW (ref 36.0–46.0)
Hemoglobin: 10.1 g/dL — ABNORMAL LOW (ref 12.0–15.0)
Immature Granulocytes: 0 %
Lymphocytes Relative: 22 %
Lymphs Abs: 1.5 K/uL (ref 0.7–4.0)
MCH: 31.4 pg (ref 26.0–34.0)
MCHC: 32.5 g/dL (ref 30.0–36.0)
MCV: 96.6 fL (ref 80.0–100.0)
Monocytes Absolute: 0.8 K/uL (ref 0.1–1.0)
Monocytes Relative: 11 %
Neutro Abs: 4.3 K/uL (ref 1.7–7.7)
Neutrophils Relative %: 63 %
Platelets: 378 K/uL (ref 150–400)
RBC: 3.22 MIL/uL — ABNORMAL LOW (ref 3.87–5.11)
RDW: 14.7 % (ref 11.5–15.5)
WBC: 6.8 K/uL (ref 4.0–10.5)
nRBC: 0 % (ref 0.0–0.2)

## 2024-07-13 LAB — BASIC METABOLIC PANEL WITH GFR
Anion gap: 11 (ref 5–15)
BUN: 46 mg/dL — ABNORMAL HIGH (ref 6–20)
CO2: 20 mmol/L — ABNORMAL LOW (ref 22–32)
Calcium: 12.1 mg/dL — ABNORMAL HIGH (ref 8.9–10.3)
Chloride: 108 mmol/L (ref 98–111)
Creatinine, Ser: 1.72 mg/dL — ABNORMAL HIGH (ref 0.44–1.00)
GFR, Estimated: 37 mL/min — ABNORMAL LOW
Glucose, Bld: 96 mg/dL (ref 70–99)
Potassium: 4.1 mmol/L (ref 3.5–5.1)
Sodium: 139 mmol/L (ref 135–145)

## 2024-07-13 LAB — PTH, INTACT AND CALCIUM
Calcium, Total (PTH): 12.2 mg/dL — ABNORMAL HIGH (ref 8.7–10.2)
PTH: <6 pg/mL — ABNORMAL LOW (ref 15–65)

## 2024-07-13 LAB — ECHOCARDIOGRAM COMPLETE
Calc EF: 51 %
S' Lateral: 2.5 cm
Single Plane A2C EF: 50.3 %
Single Plane A4C EF: 50.1 %

## 2024-07-13 MED ORDER — ENOXAPARIN SODIUM 80 MG/0.8ML IJ SOSY: 70.0000 mg | PREFILLED_SYRINGE | INTRAMUSCULAR | Status: DC

## 2024-07-13 MED ORDER — HEPARIN SODIUM (PORCINE) 1000 UNIT/ML IJ SOLN
INTRAMUSCULAR | Status: AC
  Filled 2024-07-13: qty 1

## 2024-07-13 MED ORDER — MIDAZOLAM HCL 2 MG/2ML IJ SOLN
INTRAMUSCULAR | Status: AC
  Filled 2024-07-13: qty 2

## 2024-07-13 MED ORDER — FENTANYL CITRATE (PF) 100 MCG/2ML IJ SOLN
INTRAMUSCULAR | Status: AC
  Filled 2024-07-13: qty 2

## 2024-07-13 MED ORDER — ENOXAPARIN SODIUM 80 MG/0.8ML IJ SOSY
70.0000 mg | PREFILLED_SYRINGE | INTRAMUSCULAR | Status: DC
  Administered 2024-07-13 – 2024-07-14 (×2): 70 mg via SUBCUTANEOUS
  Filled 2024-07-13 (×2): qty 0.8

## 2024-07-13 MED ORDER — LIDOCAINE-EPINEPHRINE 1 %-1:100000 IJ SOLN
INTRAMUSCULAR | Status: AC
  Filled 2024-07-13: qty 20

## 2024-07-13 MED ORDER — FENTANYL CITRATE (PF) 100 MCG/2ML IJ SOLN
INTRAMUSCULAR | Status: AC | PRN
  Administered 2024-07-13: 50 ug via INTRAVENOUS

## 2024-07-13 MED ORDER — SODIUM CHLORIDE 0.9 % IV SOLN: INTRAVENOUS | Status: DC

## 2024-07-13 MED ORDER — LIDOCAINE-EPINEPHRINE 1 %-1:100000 IJ SOLN
20.0000 mL | Freq: Once | INTRAMUSCULAR | Status: AC
  Administered 2024-07-13: 8 mL via INTRADERMAL

## 2024-07-13 MED ORDER — MIDAZOLAM HCL (PF) 2 MG/2ML IJ SOLN
INTRAMUSCULAR | Status: AC | PRN
  Administered 2024-07-13: 1 mg via INTRAVENOUS
  Administered 2024-07-13: .5 mg via INTRAVENOUS

## 2024-07-13 NOTE — Plan of Care (Signed)
  Problem: Education: Goal: Knowledge of General Education information will improve Description: Including pain rating scale, medication(s)/side effects and non-pharmacologic comfort measures Outcome: Progressing   Problem: Health Behavior/Discharge Planning: Goal: Ability to manage health-related needs will improve Outcome: Progressing   Problem: Activity: Goal: Risk for activity intolerance will decrease Outcome: Progressing   Problem: Nutrition: Goal: Adequate nutrition will be maintained Outcome: Progressing   Problem: Elimination: Goal: Will not experience complications related to bowel motility Outcome: Progressing Goal: Will not experience complications related to urinary retention Outcome: Progressing   Problem: Safety: Goal: Ability to remain free from injury will improve Outcome: Progressing

## 2024-07-13 NOTE — Procedures (Signed)
 Interventional Radiology Procedure Note  Procedure: Fluoro guided Bone Marrow biopsy (iliac bone)  Complications: None  Estimated Blood Loss: < 10 mL  Findings: Fluoro guided bone marrow biopsy of the left iliac bone.  Aspirate and core samples sent to pathology for further processing.  Cordella DELENA Banner, MD

## 2024-07-13 NOTE — Plan of Care (Signed)

## 2024-07-13 NOTE — Progress Notes (Signed)
 " PROGRESS NOTE Kathryn [name]  FMW:[id number] DOB: [birth year] DOA: 07/11/2024 PCP: Gerome Brunet, DO  Brief Narrative/Hospital Course: Kathryn [name] is a 43 y.o. female with PMH of HFpEF (EF 65-70% in 2015), HTN, HLD, DM2, OSA (not on CPAP), and morbid obesity (BMI 51) who presented w/ back pain and found to have L1 compression fracture as well as multiple lytic lesions consistent with MM or metastasis. Presented to the ED 12/26 with lower back pain onset ~ 1-2 wks ago,coincided with the mid-cycle of their menstrual period. While standing and washing dishes, the patient heard a pop and felt a crackling sensation in the back, accompanied by the worst pain they had ever experienced. In the ED : afebrile, tachycardic, and hypertensive.  Labs-mild anemia AKI with creat of 2.31 (baseline 0.8-0.9). Troponin 66. UA with only small leukocyte esterase. CT abdomen and pelvis>>multiple concerning findings of L1 compression fracture, multiple lytic lesions suspicious for metastasis or myeloma chronic bilateral hydronephrosis, left perinephric stranding, chronic or inflammatory. At [facility] ED started IV Rocephin , IV NS bolus, and admitted to [facility]  Urology, hematology oncology and IR consulted  Subjective: Seen and examined Underwent fluoroscopy guided bone marrow biopsy this morning Pain stable. No new complaints. Overnight mildly tachycardic but BP stable afebrile Labs reviewed calcium further down 12.1 stable improving creatinine 2.3>2> 1.7  Assessment and plan:  Pathologic L1 compression fracture Posterior left atrial lesions/multiple scattered lytic lesions: Oncology following finding are concerning for multiple myeloma versus metastatic disease.  CT of the chest for staging completed 12/27-expansile lytic lesions of the right first rib and right fourth rib sternum left eighth rib T1 vertebral body with pathologic fractures and mild loss of height concerning Multiple  myeloma panel sent with SPEP UPEP serum free light chain. BM  biopsy done 12/29 Further treatment plan per oncology  Chronic bilateral hydronephrosis Left perinephric stranding Bilateral UPJ obstruction Atrophic right kidney: Seen by urology finding are chronic with preservation of parenchyma in the left kidney indicating low-grade obstruction if any at all.  Creatinine is elevated, no further urological intervention at this time but to manage AKI along with workup of lytic lesions possible myeloma, urine culture unremarkable, and empiric antibiotics complete days x 3  Hypercalcemia: Likely in the setting of multiple myeloma/lytic lesion.  Calcium 13>13, check intact PTH suppressed appropriately, follow, ionized calcium Continue IV fluid hydration. Recent Labs  Lab 07/11/24 0140 07/12/24 0814 07/12/24 1059 07/13/24 0714  K 4.3 4.2  --  4.1  CALCIUM 13.0* 12.5* 12.2* 12.1*       Component Value Date/Time   PTH <6 (L) 07/12/2024 1059   PTH Comment 07/12/2024 1059    AKI Metabolic acidosis: Suspect multifactorial primarily in the setting of multiple myeloma, volume depletion.  Overall much better continue to hydrate hide IV fluids holding losartan  Lasix  for now.  Avoid hypotension, avoid nephrotoxic medication and monitor renal function.  Recent Labs    07/11/24 0140 07/11/24 1115 07/12/24 0814 07/13/24 0714  BUN 51*  --  51* 46*  CREATININE 2.31* 1.92* 2.05* 1.72*  CO2 20*  --  19* 20*  K 4.3  --  4.2 4.1    Mildly prominent fatty liver Hiatal hernia: Continue PPI.  LFTs fairly stable.  HFpEF Continue home Coreg  dose, holding LasiX , Losartan   Elevated troponin at 66 on admission: Recheck troponin elevated but flat, getting echo for completeness    HTN: On losartan  HCTZ will hold given AKI soft BP    ADHD PTA  Vyvanse .  Morbid Obesity w/ Body mass index is 51.65 kg/m.: Will benefit with PCP follow-up, weight loss,healthy lifestyle.  DVT prophylaxis:  Code  Status:   Code Status: Full Code Family Communication: plan of care discussed with patient at bedside. Patient status is: Remains hospitalized because of severity of illness Level of care: Telemetry   Dispo: The patient is from: HOME            Anticipated disposition: TBD one calcium better. Objective: Vitals last 24 hrs: Vitals:   07/13/24 1005 07/13/24 1010 07/13/24 1122 07/13/24 1157  BP: (!) 151/113 (!) 138/92 (!) 138/92 (!) 150/106  Pulse: (!) 120 (!) 118 (!) 118 (!) 115  Resp: 18 18    Temp:      TempSrc:      SpO2: 97% 96%  98%  Weight:      Height:       Physical Examination: General exam: AAOX3, pleasant. HEENT:Oral mucosa moist, Ear/Nose WNL grossly Respiratory system: Bilaterally clear BS,no use of accessory muscle Cardiovascular system: S1 & S2 +, No JVD. Gastrointestinal system: Abdomen soft, obese NT,ND, BS+ Nervous System: Alert, awake, moving all extremities,and following commands. Extremities: extremities warm, leg edema NEG Skin: Warm, no rashes MSK: Normal muscle bulk,tone, power   Medications reviewed:  Scheduled Meds:  acetaminophen   1,000 mg Oral TID   carvedilol   12.5 mg Oral BID WC   enoxaparin  (LOVENOX ) injection  70 mg Subcutaneous Q24H   gabapentin   300 mg Oral TID   lisdexamfetamine   40 mg Oral Daily   methocarbamol   500 mg Oral TID   multivitamin with minerals  1 tablet Oral Daily   pantoprazole   40 mg Oral Daily   Continuous Infusions:  sodium chloride  125 mL/hr at 07/13/24 1204   cefTRIAXone  (ROCEPHIN )  IV 1 g (07/13/24 1139)   Diet: Diet Order             Diet regular Room service appropriate? Yes; Fluid consistency: Thin  Diet effective now                   Data Reviewed: I have personally reviewed following labs and imaging studies ( see epic result tab) CBC: Recent Labs  Lab 07/11/24 0300 07/11/24 1115 07/12/24 0814 07/13/24 0714  WBC 8.0 6.7 6.6 6.8  NEUTROABS 5.5  --   --  4.3  HGB 11.6* 11.3* 10.4* 10.1*  HCT  34.0* 34.4* 31.6* 31.1*  MCV 92.9 96.6 96.3 96.6  PLT 418* 362 368 378   CMP: Recent Labs  Lab 07/11/24 0140 07/11/24 1115 07/12/24 0814 07/12/24 1059 07/13/24 0714  NA 136  --  135  --  139  K 4.3  --  4.2  --  4.1  CL 99  --  104  --  108  CO2 20*  --  19*  --  20*  GLUCOSE 105*  --  105*  --  96  BUN 51*  --  51*  --  46*  CREATININE 2.31* 1.92* 2.05*  --  1.72*  CALCIUM 13.0*  --  12.5* 12.2* 12.1*   GFR: Estimated Creatinine Clearance: 62.4 mL/min (A) (by C-G formula based on SCr of 1.72 mg/dL (H)). Recent Labs  Lab 07/11/24 0140 07/12/24 0814  AST 35 25  ALT 15 12  ALKPHOS 80 66  BILITOT 0.4 0.2  PROT 7.2 6.2*  ALBUMIN 4.6 3.9   No results for input(s): LIPASE, AMYLASE in the last 168 hours. No results for input(s): AMMONIA  in the last 168 hours. Coagulation Profile: No results for input(s): INR, PROTIME in the last 168 hours. Unresulted Labs (From admission, onward)     Start     Ordered   07/18/24 0500  Creatinine, serum  (enoxaparin  (LOVENOX )    CrCl >/= 30 ml/min)  Weekly,   R     Comments: while on enoxaparin  therapy    07/11/24 0928   07/13/24 0500  Basic metabolic panel with GFR  Daily,   R      07/12/24 0924   07/13/24 0500  CBC  Daily,   R      07/12/24 0924   07/11/24 1001  Multiple Myeloma Panel (SPEP&IFE w/QIG)  Once,   R        07/11/24 1000   07/11/24 1001  Kappa/lambda light chains  Once,   R        07/11/24 1000   07/11/24 1001  UPEP/UIFE/Light Chains/TP, 24-Hr Ur  Once,   R        07/11/24 1000   07/11/24 0949  Urinalysis, w/ Reflex to Culture (Infection Suspected) -Urine, Catheterized  (Urine Labs)  Once,   R       Question:  Specimen Source  Answer:  Urine, Catheterized   07/11/24 0948   07/11/24 0100  CBC with Differential  Once,   STAT        07/11/24 0102           Antimicrobials/Microbiology: Anti-infectives (From admission, onward)    Start     Dose/Rate Route Frequency Ordered Stop   07/12/24 1000  cefTRIAXone   (ROCEPHIN ) 1 g in sodium chloride  0.9 % 100 mL IVPB        1 g 200 mL/hr over 30 Minutes Intravenous Every 24 hours 07/11/24 0948 07/16/24 0959   07/11/24 0115  cefTRIAXone  (ROCEPHIN ) 1 g in sodium chloride  0.9 % 100 mL IVPB        1 g 200 mL/hr over 30 Minutes Intravenous  Once 07/11/24 0102 07/11/24 0406         Component Value Date/Time   SDES  07/11/2024 0606    BLOOD LEFT ANTECUBITAL Performed at [facility], [address] [place], [place] [address]    [facility]  07/11/2024 0606    BOTTLES DRAWN AEROBIC AND ANAEROBIC Blood Culture adequate volume Performed at [facility], [address] [place], [place] [address]    CULT  07/11/2024 0606    NO GROWTH 2 DAYS Performed at [facility], [address] [place], [place] [address]    REPTSTATUS PENDING 07/11/2024 9393    Procedures:  Mennie LAMY, MD [facility] 07/13/2024, 12:37 PM   "

## 2024-07-14 DIAGNOSIS — M8448XA Pathological fracture, other site, initial encounter for fracture: Secondary | ICD-10-CM | POA: Diagnosis not present

## 2024-07-14 LAB — URINALYSIS, W/ REFLEX TO CULTURE (INFECTION SUSPECTED)
Bilirubin Urine: NEGATIVE
Glucose, UA: NEGATIVE mg/dL
Ketones, ur: NEGATIVE mg/dL
Nitrite: NEGATIVE
Protein, ur: 100 mg/dL — AB
Specific Gravity, Urine: 1.011 (ref 1.005–1.030)
WBC, UA: >50 WBC/hpf (ref 0–5)
pH: 5 (ref 5.0–8.0)

## 2024-07-14 LAB — BASIC METABOLIC PANEL WITH GFR
Anion gap: 9 (ref 5–15)
BUN: 36 mg/dL — ABNORMAL HIGH (ref 6–20)
CO2: 22 mmol/L (ref 22–32)
Calcium: 12.2 mg/dL — ABNORMAL HIGH (ref 8.9–10.3)
Chloride: 108 mmol/L (ref 98–111)
Creatinine, Ser: 1.45 mg/dL — ABNORMAL HIGH (ref 0.44–1.00)
GFR, Estimated: 46 mL/min — ABNORMAL LOW
Glucose, Bld: 108 mg/dL — ABNORMAL HIGH (ref 70–99)
Potassium: 4.4 mmol/L (ref 3.5–5.1)
Sodium: 138 mmol/L (ref 135–145)

## 2024-07-14 LAB — CBC
HCT: 30.7 % — ABNORMAL LOW (ref 36.0–46.0)
Hemoglobin: 10 g/dL — ABNORMAL LOW (ref 12.0–15.0)
MCH: 32.1 pg (ref 26.0–34.0)
MCHC: 32.6 g/dL (ref 30.0–36.0)
MCV: 98.4 fL (ref 80.0–100.0)
Platelets: 364 K/uL (ref 150–400)
RBC: 3.12 MIL/uL — ABNORMAL LOW (ref 3.87–5.11)
RDW: 14.6 % (ref 11.5–15.5)
WBC: 7.4 K/uL (ref 4.0–10.5)
nRBC: 0 % (ref 0.0–0.2)

## 2024-07-14 MED ORDER — ZOLEDRONIC ACID 4 MG/5ML IV CONC
3.3000 mg | Freq: Once | INTRAVENOUS | Status: AC
  Administered 2024-07-14: 3.3 mg via INTRAVENOUS
  Filled 2024-07-14: qty 4.13

## 2024-07-14 MED ORDER — ENSURE PLUS HIGH PROTEIN PO LIQD
237.0000 mL | Freq: Two times a day (BID) | ORAL | Status: DC
  Administered 2024-07-14 – 2024-07-15 (×4): 237 mL via ORAL

## 2024-07-14 NOTE — TOC Initial Note (Addendum)
 Transition of Care (TOC) - Initial/Assessment Note  Spoke to patient at bedside.   Patient discharging to [facility] [address] [place], [place] [address] . Does not want address changed in system.   PT recommending HHPT, 3 in 1 , rolling walker and shower seat .   Discussed with patient . No preference.   Burnard with [facility] accepted referral for home health. Patient aware usually twice a week for a hour at a time.   DME ordered with Jermaine with [employer]. Shower seat is not covered by insurance. [employer] will discuss price with patient. Patient aware and does not want shower seat.   Patient plans to arrange a large uber and states she can handle rolling walker  and 3 in 1 . She is aware uber driver will not assist with DME . Messaged  Catherine PT . Discussed with patient and PT and [employer]. [employer] will deliver Bari RW to hospital room and drop ship bari 3 in 1 to hotel address which takes 1 to 2 days   Patient would like to use [facility] at discharge. Pharmacy changed to [facility]   Vannie has been delivered to bedside. Friend will transport her to her hotel. Patient still wants bari 3 in 1 shipped to hotel. [employer] has spoken with patient   Patient Details  Name: Kathryn [name] MRN: [id number] Date of Birth: Aug 25, 1980  Transition of Care [facility]) CM/SW Contact:    Stephane Powell Jansky, RN Phone Number: 07/14/2024, 2:26 PM  Clinical Narrative:                   Expected Discharge Plan: Home w Home Health Services Barriers to Discharge: Continued Medical Work up   Patient Goals and CMS Choice Patient states their goals for this hospitalization and ongoing recovery are:: return to hotel CMS Medicare.gov Compare Post Acute Care list provided to:: Patient Choice offered to / list presented to : Patient      Expected Discharge Plan and Services   Discharge Planning Services: CM Consult Post Acute Care Choice: Home Health Living arrangements for  the past 2 months: Hotel/Motel                 DME Arranged: 3-N-1, Walker rolling, Shower stool DME Agency: [employer] Date DME Agency Contacted: 07/14/24 Time DME Agency Contacted: 1424 Representative spoke with at DME Agency: [place] HH Arranged: PT HH Agency: [facility] Date [facility] Agency Contacted: 07/14/24 Time HH Agency Contacted: 1424 Representative spoke with at [facility] Agency: Burnard  Prior Living Arrangements/Services Living arrangements for the past 2 months: Hotel/Motel Lives with:: Self Patient language and need for interpreter reviewed:: Yes Do you feel safe going back to the place where you live?: Yes      Need for Family Participation in Patient Care: No (Comment) Care giver support system in place?:  (has friend)   Criminal Activity/Legal Involvement Pertinent to Current Situation/Hospitalization: No - Comment as needed  Activities of Daily Living   ADL Screening (condition at time of admission) Independently performs ADLs?: Yes (appropriate for developmental age) Is the patient deaf or have difficulty hearing?: No Does the patient have difficulty seeing, even when wearing glasses/contacts?: No Does the patient have difficulty concentrating, remembering, or making decisions?: No  Permission Sought/Granted   Permission granted to share information with : Yes, Verbal Permission Granted     Permission granted to share info w AGENCY: [employer] and [facility]  Emotional Assessment Appearance:: Appears stated age Attitude/Demeanor/Rapport: Engaged Affect (typically observed): Appropriate Orientation: : Oriented to Self, Oriented to Place, Oriented to  Time, Oriented to Situation Alcohol / Substance Use: Not Applicable Psych Involvement: No (comment)  Admission diagnosis:  Pyelonephritis [N12] Pathologic fracture of lumbar vertebra [F15.51KJ] Acute low back pain without sciatica, unspecified back pain laterality [M54.50] Patient Active  Problem List   Diagnosis Date Noted   Pathologic fracture of lumbar vertebra 07/11/2024   Lytic bone lesions on xray 07/11/2024   DM type 2 (diabetes mellitus, type 2) (HCC) 04/16/2012   Chronic systolic CHF (congestive heart failure) (HCC) 04/16/2012   Abnormal ultrasound of kidney 04/16/2012   Pancreatitis, acute 04/15/2012   SCARLET FEVER 11/22/2008   PULMONARY HYPERTENSION 11/22/2008   SYSTOLIC HEART FAILURE, ACUTE ON CHRONIC 11/22/2008   UNSPECIFIED TACHYCARDIA 11/22/2008   DM 11/20/2008   Morbid obesity (HCC) 11/20/2008   Essential hypertension 11/20/2008   CARDIOMEGALY 11/20/2008   Sleep apnea 11/20/2008   LEG EDEMA 11/20/2008   PCP:  Gerome Brunet, DO Pharmacy:   [facility] [phone] [place], [place] - [address] [place] [address] Phone: [phone] Fax: [phone]  [id number] - [place], [place] - [address] [facility] [address] [place] [address] Phone: [phone] Fax: [phone]     Social Drivers of Health (SDOH) Social History: SDOH Screenings   Food Insecurity: No Food Insecurity (07/11/2024)  Housing: Unknown (07/11/2024)  Transportation Needs: No Transportation Needs (07/11/2024)  Utilities: Not At Risk (07/11/2024)  Tobacco Use: Medium Risk (07/10/2024)   SDOH Interventions:     Readmission Risk Interventions     No data to display

## 2024-07-14 NOTE — Consult Note (Signed)
 [facility] CONSULT NOTE  Patient Care Team: Gerome Brunet, DO as PCP - General (Family Medicine)  CHIEF COMPLAINTS/PURPOSE OF CONSULTATION:  Suspicion for multiple myeloma  REFERRING PHYSICIAN:  HISTORY OF PRESENTING ILLNESS:  Kathryn [name] 43 y.o. female who presented on 07/11/2024 with complaints of progressive back pain.  Imaging done showed L1 compression fracture and multiple lytic lesions suspicious for myeloma or metastasis.  Therefore oncology evaluation was requested. Patient is seen awake alert and oriented x 4 she is a very pleasant lady who details her chief complaints and history.  She reports that approximately 2 weeks prior to presentation she began to have severe back pain, initially thought related to her monthly menstruation however back pain became progressively worse.  She took ibuprofen  although was informed she was not supposed to many years ago due to weight loss surgery.  This somewhat alleviated the back pain however while she was washing dishes and turned around she heard a pop and felt the worst pain she had ever had in her life.  States she went to bed and tried to sleep it off.  The pain eased somewhat the next day and she was able to celebrate Christmas with her very good friend.  However the pain returned worse than before and she decided to go to the ED for evaluation. Medical history significant for anemia, CHF, diabetes, hyperlipidemia, and hypertension. Surgical history significant for weight loss surgery in July 2012.  Also states she had labial surgery to reduce the size of her labia. Family oncologic history includes a maternal great aunt with a liver cancer.  Family history is significant for diabetes, heart disease and stroke. Social history includes social alcohol use, very rarely.  Tobacco use x 1 year only.  Denies illicit drug use.  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     I have reviewed her chart and materials related to her cancer extensively and collaborated history with the patient. Summary of oncologic history is as follows: Oncology History   No problem history exists.    ASSESSMENT & PLAN:  Multiple lytic lesions Acute back pain Pathologic L1 compression fracture - Imaging done 07/11/2024 showed lytic lesion posterior left eighth rib, T11 and L5 vertebral body.  Suspicious for metastatic disease or myeloma. - Status post bone marrow biopsy done 07/13/2024.  Path is pending. - Definitive treatment recommendations pending path results. - Medical oncology/Dr. Federico managing and will make further recommendations.  Hypercalcemia - Calcium 13.0 on arrival 07/11/2024.  Current calcium 12.2 today. - May be secondary to myeloma - Zometa  3.3 mg ordered due to renal dysfunction.  Will plan for administration every 3 months. -Continue to monitor calcium level  Renal dysfunction/AKI - Likely multifactorial and secondary to malignancy, diabetes - Elevated creatinine and BUN with decreased GFR - Avoid nephrotoxic agents - Continue to monitor renal function - Urology following  Anemia - Hemoglobin 10.0 - No transfusional intervention required at this time - Continue to monitor CBC with differential  Diabetes - Continue to monitor blood glucose levels and administer insulin  per protocol  Morbid obesity Hypertension Heart failure - Status post weight loss surgery in 2012 - Continue to monitor blood pressure closely - Medicine managing   MEDICAL HISTORY:  Past Medical History:  Diagnosis Date   Anemia    history only - no current problem   CHF (congestive heart failure) (HCC)    ECHO cardiogram schedule 07/11/11  Diabetes mellitus    no med - last A1C was 5.9  on 04/2011   Hyperlipidemia    Hypertension    Sleep apnea    does not use CPAP   UTI (lower urinary tract infection)    Vitamin D  deficiency     SURGICAL  HISTORY: Past Surgical History:  Procedure Laterality Date   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    SOCIAL HISTORY: Social History   Socioeconomic History   Marital status: Married    Spouse name: Not on file   Number of children: Not on file   Years of education: Not on file   Highest education level: Not on file  Occupational History   Not on file  Tobacco Use   Smoking status: Former    Current packs/day: 0.00    Average packs/day: 0.3 packs/day for 1 year (0.3 ttl pk-yrs)    Types: Cigarettes    Start date: 12/15/1999    Quit date: 12/14/2000    Years since quitting: 23.5   Smokeless tobacco: Never  Substance and Sexual Activity   Alcohol use: Yes    Comment: Rarely - twice a year   Drug use: No   Sexual activity: Never    Birth control/protection: Condom  Other Topics Concern   Not on file  Social History Narrative   Not on file   Social Drivers of Health   Tobacco Use: Medium Risk (07/10/2024)   Patient History    Smoking Tobacco Use: Former    Smokeless Tobacco Use: Never    Passive Exposure: Not on Actuary Strain: Not on file  Food Insecurity: No Food Insecurity (07/11/2024)   Epic    Worried About Programme Researcher, Broadcasting/film/video in the Last Year: Never true    Ran Out of Food in the Last Year: Never true  Transportation Needs: No Transportation Needs (07/11/2024)   Epic    Lack of Transportation (Medical): No    Lack of Transportation (Non-Medical): No  Physical Activity: Not on file  Stress: Not on file  Social Connections: Not on file  Intimate Partner Violence: Not At Risk (07/11/2024)   Epic    Fear of Current or Ex-Partner: No    Emotionally Abused: No    Physically Abused: No     Sexually Abused: No  Depression (PHQ2-9): Not on file  Alcohol Screen: Not on file  Housing: Unknown (07/11/2024)   Epic    Unable to Pay for Housing in the Last Year: No    Number of Times Moved in the Last Year: Not on file    Homeless in the Last Year: No  Utilities: Not At Risk (07/11/2024)   Epic    Threatened with loss of utilities: No  Health Literacy: Not on file    FAMILY HISTORY: History reviewed. No pertinent family history.   PHYSICAL EXAMINATION: ECOG PERFORMANCE STATUS: 3 - Symptomatic, >50% confined to bed  Vitals:   07/14/24 0537 07/14/24 1020  BP: 121/88 132/82  Pulse: (!) 117 (!) 120  Resp: 17 18  Temp: 98.2 F (36.8 C) 98.1 F (36.7 C)  SpO2: 98% 99%   Filed Weights   07/10/24 2138  Weight: (!) 320 lb (  145.2 kg)    GENERAL: alert, no distress and comfortable +morbidly obese SKIN: skin color, texture, turgor are normal, no rashes or significant lesions EYES: normal, conjunctiva are pink and non-injected, sclera clear OROPHARYNX: no exudate, no erythema and lips, buccal mucosa, and tongue normal  NECK: supple, thyroid  normal size, non-tender, without nodularity LYMPH: no palpable lymphadenopathy in the cervical, axillary or inguinal LUNGS: clear to auscultation and percussion with normal breathing effort HEART: regular rate & rhythm and no murmurs and no lower extremity edema ABDOMEN: abdomen soft, non-tender and normal bowel sounds MUSCULOSKELETAL: no cyanosis of digits and no clubbing  PSYCH: alert & oriented x 3 with fluent speech NEURO: no focal motor/sensory deficits   ALLERGIES:  is allergic to pravastatin, floxin [ofloxacin], lisinopril , and sulfa antibiotics.  MEDICATIONS:  Current Facility-Administered Medications  Medication Dose Route Frequency Provider Last Rate Last Admin   0.9 %  sodium chloride  infusion   Intravenous Continuous Christobal Guadalajara, MD 125 mL/hr at 07/14/24 0845 New Bag at 07/14/24 0845   acetaminophen  (TYLENOL ) tablet  1,000 mg  1,000 mg Oral TID Georgina Basket, MD   1,000 mg at 07/14/24 9164   carvedilol  (COREG ) tablet 12.5 mg  12.5 mg Oral BID WC Kc, Guadalajara, MD   12.5 mg at 07/14/24 0835   cefTRIAXone  (ROCEPHIN ) 1 g in sodium chloride  0.9 % 100 mL IVPB  1 g Intravenous Q24H Georgina Basket, MD 200 mL/hr at 07/14/24 0844 1 g at 07/14/24 0844   enoxaparin  (LOVENOX ) injection 70 mg  70 mg Subcutaneous Q24H Kc, Ramesh, MD   70 mg at 07/13/24 1813   feeding supplement (ENSURE PLUS HIGH PROTEIN) liquid 237 mL  237 mL Oral BID BM Kc, Guadalajara, MD   237 mL at 07/14/24 9043   gabapentin  (NEURONTIN ) capsule 300 mg  300 mg Oral TID Georgina Basket, MD   300 mg at 07/14/24 0835   lisdexamfetamine  (VYVANSE ) capsule 40 mg  40 mg Oral Daily Georgina Basket, MD   40 mg at 07/14/24 0835   methocarbamol  (ROBAXIN ) tablet 500 mg  500 mg Oral TID Georgina Basket, MD   500 mg at 07/14/24 9164   multivitamin with minerals tablet 1 tablet  1 tablet Oral Daily Georgina Basket, MD   1 tablet at 07/14/24 9164   oxyCODONE  (Oxy IR/ROXICODONE ) immediate release tablet 5 mg  5 mg Oral Q4H PRN Georgina Basket, MD   5 mg at 07/14/24 1237   pantoprazole  (PROTONIX ) EC tablet 40 mg  40 mg Oral Daily Georgina Basket, MD   40 mg at 07/14/24 9164     LABORATORY DATA:  I have reviewed the data as listed Lab Results  Component Value Date   WBC 7.4 07/14/2024   HGB 10.0 (L) 07/14/2024   HCT 30.7 (L) 07/14/2024   MCV 98.4 07/14/2024   PLT 364 07/14/2024   Recent Labs    07/11/24 0140 07/11/24 1115 07/12/24 0814 07/12/24 1059 07/13/24 0714 07/14/24 0521  NA 136  --  135  --  139 138  K 4.3  --  4.2  --  4.1 4.4  CL 99  --  104  --  108 108  CO2 20*  --  19*  --  20* 22  GLUCOSE 105*  --  105*  --  96 108*  BUN 51*  --  51*  --  46* 36*  CREATININE 2.31*   < > 2.05*  --  1.72* 1.45*  CALCIUM 13.0*  --  12.5* 12.2* 12.1* 12.2*  GFRNONAA 26*   < > 30*  --  37* 46*  PROT 7.2  --  6.2*  --   --   --   ALBUMIN 4.6  --  3.9  --   --   --   AST 35  --   25  --   --   --   ALT 15  --  12  --   --   --   ALKPHOS 80  --  66  --   --   --   BILITOT 0.4  --  0.2  --   --   --    < > = values in this interval not displayed.    RADIOGRAPHIC STUDIES: I have personally reviewed the radiological images as listed and agreed with the findings in the report. IR BONE MARROW BIOPSY Result Date: 07/13/2024 INDICATION: Back pain, lytic bony lesions, acute kidney injury and concern for multiple myeloma versus metastatic disease. Planned image guided bone marrow biopsy for diagnosis. EXAM: Bone marrow biopsy FLUOROSCOPIC GUIDANCE ANESTHESIA/SEDATION: Moderate (conscious) sedation was employed during this procedure. A total of Versed  1.5 mg and Fentanyl  50 mcg was administered intravenously by the radiology nurse. Total intra-service moderate Sedation Time: 10 minutes. The patient's level of consciousness and vital signs were monitored continuously by radiology nursing throughout the procedure under my direct supervision. FLUOROSCOPY: Radiation Exposure Index (as provided by the fluoroscopic device): 33 mGy Kerma COMPLICATIONS: None immediate. PROCEDURE: The patient was advised of the possible risks and complications and agreed to undergo the procedure. The patient was then brought to the angiographic suite for the procedure. Patient was placed in a prone position on the fluoroscopic suite. Fluoroscopy demonstrated access and the gluteal region was prepped and draped in the usual sterile fashion. Local anesthesia was achieved with 1% lidocaine  by infiltrating subcutaneous tissue from the skin to the level of the end ostium overlying the left iliac bone. A small incision was made in the left gluteal region. 13 gauge needle was then advanced through the incision to the right iliac bone posteriorly under fluoroscopic guidance. The cortex was engaged and the needle was advanced into the bone marrow. A 2 mL sample was obtained and evaluated for spicules. A 7 mL sample was then  obtained mixed with heparin . A core needle sample was then obtained of the bone marrow by advancing the needle 2 cm further into the posterior aspect of the left iliac bone. All needles withdrawn from the patient. Sterile dressing applied. IMPRESSION: Satisfactory aspirate and core sampling of the left iliac bone. Electronically Signed   By: Cordella Banner   On: 07/13/2024 20:59   ECHOCARDIOGRAM COMPLETE Result Date: 07/13/2024    ECHOCARDIOGRAM REPORT   Patient Name:   [facility] Shiffer Date of Exam: 07/12/2024 Medical Rec #:  [id number]                   Height:       66.0 in Accession #:    [id number]                  Weight:       320.0 lb Date of Birth:  12/31/1980                   BSA:          2.442 m Patient Age:    63 years  BP:           128/82 mmHg Patient Gender: F                           HR:           124 bpm. Exam Location:  Inpatient Procedure: 2D Echo (Both Spectral and Color Flow Doppler were utilized during            procedure). Indications:    elevated troponin  History:        Patient has prior history of Echocardiogram examinations, most                 recent 11/11/2013. CHF, Pulmonary HTN, Signs/Symptoms:Edema; Risk                 Factors:Diabetes, Hypertension and Sleep Apnea.  Sonographer:    Tinnie Barefoot RDCS Referring Phys: [id number] RAMESH KC  Sonographer Comments: Patient is obese. Image acquisition challenging due to patient body habitus. IMPRESSIONS  1. Left ventricular ejection fraction, by estimation, is 65 to 70%. The left ventricle has normal function. The left ventricle has no regional wall motion abnormalities. There is moderate concentric left ventricular hypertrophy. Left ventricular diastolic parameters were normal.  2. Right ventricular systolic function is normal. The right ventricular size is normal. There is mildly elevated pulmonary artery systolic pressure. The estimated right ventricular systolic pressure is 38.3 mmHg.  3. There  is no evidence of cardiac tamponade.  4. The mitral valve is normal in structure. No evidence of mitral valve regurgitation. No evidence of mitral stenosis.  5. The aortic valve is tricuspid. Aortic valve regurgitation is not visualized. No aortic stenosis is present.  6. The inferior vena cava is normal in size with greater than 50% respiratory variability, suggesting right atrial pressure of 3 mmHg. FINDINGS  Left Ventricle: Left ventricular ejection fraction, by estimation, is 65 to 70%. The left ventricle has normal function. The left ventricle has no regional wall motion abnormalities. The left ventricular internal cavity size was normal in size. There is  moderate concentric left ventricular hypertrophy. Left ventricular diastolic parameters were normal. Right Ventricle: The right ventricular size is normal. No increase in right ventricular wall thickness. Right ventricular systolic function is normal. There is mildly elevated pulmonary artery systolic pressure. The tricuspid regurgitant velocity is 2.97  m/s, and with an assumed right atrial pressure of 3 mmHg, the estimated right ventricular systolic pressure is 38.3 mmHg. Left Atrium: Left atrial size was normal in size. Right Atrium: Right atrial size was normal in size. Pericardium: Trivial pericardial effusion is present. The pericardial effusion is posterior and lateral to the left ventricle. There is no evidence of cardiac tamponade. Mitral Valve: The mitral valve is normal in structure. No evidence of mitral valve regurgitation. No evidence of mitral valve stenosis. Tricuspid Valve: The tricuspid valve is normal in structure. Tricuspid valve regurgitation is trivial. No evidence of tricuspid stenosis. Aortic Valve: The aortic valve is tricuspid. Aortic valve regurgitation is not visualized. No aortic stenosis is present. Pulmonic Valve: The pulmonic valve was normal in structure. Pulmonic valve regurgitation is not visualized. No evidence of pulmonic  stenosis. Aorta: The aortic root is normal in size and structure. Venous: The inferior vena cava is normal in size with greater than 50% respiratory variability, suggesting right atrial pressure of 3 mmHg. IAS/Shunts: No atrial level shunt detected by color flow Doppler.  LEFT VENTRICLE PLAX 2D LVIDd:  3.60 cm LVIDs:         2.50 cm LV PW:         1.40 cm LV IVS:        1.40 cm LV IVRT:       95 msec  LV Volumes (MOD) LV vol d, MOD A2C: 66.6 ml LV vol d, MOD A4C: 69.1 ml LV vol s, MOD A2C: 33.1 ml LV vol s, MOD A4C: 34.5 ml LV SV MOD A2C:     33.5 ml LV SV MOD A4C:     69.1 ml LV SV MOD BP:      35.4 ml RIGHT VENTRICLE             IVC RV Basal diam:  2.70 cm     IVC diam: 1.70 cm RV S prime:     11.20 cm/s TAPSE (M-mode): 1.7 cm LEFT ATRIUM           Index        RIGHT ATRIUM           Index LA diam:      4.30 cm 1.76 cm/m   RA Area:     13.50 cm LA Vol (A2C): 41.7 ml 17.08 ml/m  RA Volume:   34.60 ml  14.17 ml/m   AORTA Ao Root diam: 2.90 cm Ao Asc diam:  2.90 cm TRICUSPID VALVE TR Peak grad:   35.3 mmHg TR Vmax:        297.00 cm/s Oneil Parchment MD Electronically signed by Oneil Parchment MD Signature Date/Time: 07/13/2024/5:20:20 PM    Final    CT CHEST WO CONTRAST Result Date: 07/13/2024 EXAM: CT CHEST WITHOUT CONTRAST 07/11/2024 11:36:00 AM TECHNIQUE: CT of the chest was performed without the administration of intravenous contrast. Multiplanar reformatted images are provided for review. Automated exposure control, iterative reconstruction, and/or weight based adjustment of the mA/kV was utilized to reduce the radiation dose to as low as reasonably achievable. COMPARISON: None available. CLINICAL HISTORY: chronic dyspnea, back pain FINDINGS: MEDIASTINUM: Heart and pericardium are unremarkable. The central airways are clear. LYMPH NODES: No mediastinal, hilar or axillary lymphadenopathy. LUNGS AND PLEURA: Band like atelectasis within the right lower lobe. No focal consolidation or pulmonary edema. No  pleural effusion or pneumothorax. SOFT TISSUES/BONES: Multiple healed left rib fractures. Expansile lytic lesion within the right first rib at the costochondral junction and right fourth rib anterolaterally, sternum, and left eighth rib posterolaterally, and T11 vertebral body which demonstrates pathologic fracture and a mild loss of height or in keeping with probably multifocal metastatic disease or multiple myeloma. UPPER ABDOMEN: Small hiatal hernia. 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 Atrophy of the right kidney and hydronephrosis of the left kidney are partially visualized, better assessed on CT examination of the abdomen and pelvis performed at 02:33 am. IMPRESSION: 1. Band-like atelectasis within the right lower lobe. 2. Expansile lytic lesions involving the right first rib at the costochondral junction, right fourth rib anterolaterally, sternum, left eighth rib posterolaterally, and T11 vertebral body with pathologic fracture and mild loss of height, concerning for multifocal metastatic disease or multiple myeloma. 3. Atrophy of the right kidney and hydronephrosis of the left kidney, partially visualized. 4. Multiple healed left rib fractures. 5. 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 Electronically signed by: Dorethia Molt MD 07/13/2024 12:43 AM EST RP Workstation: [id number]   CT ABDOMEN PELVIS WO CONTRAST Result Date: 07/11/2024 EXAM: CT ABDOMEN AND PELVIS WITHOUT CONTRAST 07/11/2024 03:28:00  AM TECHNIQUE: CT of the abdomen and pelvis was performed without the administration of intravenous contrast. Multiplanar reformatted images are provided for review. Automated exposure control, iterative reconstruction, and/or weight-based adjustment of the mA/kV was utilized to reduce the radiation dose to as low as reasonably achievable. COMPARISON: CT without contrast 07/06/2019, CT with contrast 03/31/2015. CLINICAL HISTORY: Low back  pain onset last night, progressively worsening since. Unsure if this is a kidney stone or urinary tract infection. She also felt a painful pop in her back last week. FINDINGS: LOWER CHEST: Mild atelectasis in the posterior lower lobes. Lung bases are clear of infiltrates with mild chronic elevation of the right diaphragm. The cardiac size is normal. There is a small hiatal hernia. There is calcification in the LAD (Left Anterior Descending) coronary artery. LIVER: The liver is enlarged, measuring 23 cm in length, and is mildly steatotic. No mass is seen without contrast. GALLBLADDER AND BILE DUCTS: There are tiny stones layering posteriorly in the gallbladder, but no wall thickening or biliary dilatation. SPLEEN: The spleen is normal in size and noncontrast attenuation. PANCREAS: The pancreas is normal in size, shape and noncontrast attenuation. ADRENAL GLANDS: There is no adrenal mass. KIDNEYS, URETERS AND BLADDER: No renal mass is seen without contrast bilaterally. There is chronic moderate to severe dilatation in the right renal pelvis with patchy cortical scarring and volume loss of the right kidney. The right ureter is small in caliber without obstructing stone. This was seen previously, consistent with a chronic UPJ (Ureteropelvic Junction) stenosis. On the left, there is also chronic hydronephrosis ending at the UPJ but this kidney demonstrates compensatory hypertrophy without focal volume loss. There is increased left perinephric stranding since the prior study, which could be chronic or inflammatory. There is no nephrolithiasis, ureteral or bladder stones. There are left gonadal vein phleboliths. Multiple pelvic phleboliths. Urinary bladder is unremarkable. 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 Stomach demonstrates no acute abnormality. There is no small bowel obstruction or inflammation. The  appendix is normal. There is sigmoid diverticulosis without diverticulitis. There is no bowel obstruction. PERITONEUM AND RETROPERITONEUM: No ascites. No free air. No free hemorrhage. No incarcerated hernia. VASCULATURE: Aorta is normal in caliber. There is mild aortic atherosclerosis without aneurysm. LYMPH NODES: No lymphadenopathy. REPRODUCTIVE ORGANS: No acute abnormality. BONES AND SOFT TISSUES: There is a new expansile intramedullary predominantly lucent/lytic lesion with areas of cortical dehiscence involving the posterior left 8th rib measuring 5.3 x 1.5 cm, without an overt pathologic fracture.+ This is suspicious for primary or metastatic neoplasm including multiple myeloma. If this is metastatic disease, I do not see a primary source for it on this exam. In addition, there is evidence of a recent comminuted wedge compression fracture of the L1 vertebral body, with fragmentation centrally and mild edema in the paraspinal soft tissues without paraspinal hematoma. Loss of vertebral body height is 30% anteriorly, up to 70% centrally, and about 10% posteriorly with slight posterior superior retropulsion. I do not see a soft tissue component to this infiltrating the spinal canal. I suspect this was probably a pathologic fracture. There is a somewhat lucent appearance in the central L1 body. The L5 body demonstrates several small lytic lesions. There are lytic lesions in both medial iliac crests measuring 2.5 x 1 cm on the right and 1.2 cm on the left. No other focal bone lesion is seen. No focal soft tissue abnormality. IMPRESSION:  1. Recent comminuted wedge compression fracture of the L1 vertebral body with mild paraspinal soft tissue edema and slight retropulsion, likely pathologic. 2. No nephrolithiasis or obstructing ureteral stone. 3. New expansile intramedullary lytic lesion in the posterior left 8th rib, suspicious for neoplasm (including multiple myeloma or metastatic disease), without a primary  malignancy identified in the abdomen or pelvis. 4. Additional lytic lesions in the L5 vertebral body and both medial iliac crests, suspicious for metastases or myeloma. 5. Chronic bilateral hydronephrosis with UPJ configuration, including chronic moderate to severe right renal pelvic dilatation with cortical scarring and volume loss, and increased left perinephric stranding since the prior study, which may be chronic or inflammatory. 6. Mildly prominent fatty liver. 7. Hiatal hernia. 8. Aortic and coronary artery atherosclerosis. Electronically signed by: Francis Quam MD 07/11/2024 04:36 AM EST RP Workstation: [id number]     I personally spent a total of 55 minutes minutes in the care of the patient today including preparing to see the patient, getting/reviewing separately obtained history, performing a medically appropriate exam/evaluation, counseling and educating, placing orders, referring and communicating with other health care professionals, documenting clinical information in the EHR, communicating results, and coordinating care.    All questions were answered. The patient knows to call the clinic with any problems, questions or concerns. No barriers to learning was detected.  Olam JINNY Brunner, NP 12/30/20251:23 PM

## 2024-07-14 NOTE — Progress Notes (Signed)
 " PROGRESS NOTE Kathryn [name]  FMW:[id number] DOB: 28-May-1981 DOA: 07/11/2024 PCP: Gerome Brunet, DO  Brief Narrative/Hospital Course: Kathryn [name] is a 43 y.o. female with PMH of HFpEF (EF 65-70% in 2015), HTN, HLD, DM2, OSA (not on CPAP), and morbid obesity (BMI 51) who presented w/ back pain and found to have L1 compression fracture as well as multiple lytic lesions consistent with MM or metastasis. Presented to the ED 12/26 with lower back pain onset ~ 1-2 wks ago,coincided with the mid-cycle of their menstrual period. While standing and washing dishes, the patient heard a pop and felt a crackling sensation in the back, accompanied by the worst pain they had ever experienced. In the ED : afebrile, tachycardic, and hypertensive.  Labs-mild anemia AKI with creat of 2.31 (baseline 0.8-0.9). Troponin 66. UA with only small leukocyte esterase. CT abdomen and pelvis>>multiple concerning findings of L1 compression fracture, multiple lytic lesions suspicious for metastasis or myeloma chronic bilateral hydronephrosis, left perinephric stranding, chronic or inflammatory. At [facility] ED started IV Rocephin , IV NS bolus, and admitted to [facility]  Urology, hematology oncology and IR consulted  Subjective: Seen and examined Complains of ongoing low back pain Was able to mobilize independently earlier but she thinks she overdid it, requesting PT OT Overnight remains afebrile does have tachycardia but vitals stable on room air Labs reviewed creatinine further down 1.4 calcium 12.2,   Assessment and plan:  Pathologic L1 compression fracture Multiple lytic lesions Acute low back pain: Oncology following finding are concerning for multiple myeloma versus metastatic disease.  CT of the chest for staging completed 12/27-expansile lytic lesions of the right first rib and right fourth rib sternum left eighth rib T1 vertebral body with pathologic fractures and mild loss of height.  Multiple myeloma panel,SPEP UPEP, serum free light chain pending. Beta2 microglobulin elevated ~ 10. BM  biopsy done 12/29-results pending.  Urine is still being collected. Continue multimodal pain management with oxycodone , Robaxin  tid, Tylenol  1 g tid,gabapentin  . PT OT to eval-see lives in extended stay facility/home. Further treatment plan per oncology-requested to follow-up today   Hypercalcemia 13: Suspect due to patient's skeletal finding with lytic lesions.Calcium improving > 12.2 iPTH < 6 suppressed appropriately follow, ionized calcium, Continue IV fluid hydration. Hem onc notified -await further recommendation if patient needs additional meds question Zometa  Recent Labs  Lab 07/11/24 0140 07/12/24 0814 07/12/24 1059 07/13/24 0714 07/14/24 0521  K 4.3 4.2  --  4.1 4.4  CALCIUM 13.0* 12.5* 12.2* 12.1* 12.2*       Component Value Date/Time   PTH <6 (L) 07/12/2024 1059   PTH Comment 07/12/2024 1059   Chronic bilateral hydronephrosis Left perinephric stranding Bilateral UPJ obstruction Atrophic right kidney: Seen by urology finding are chronic with preservation of parenchyma in the left kidney indicating low-grade obstruction if any. No urological intervention planned at this time.  Creatinine is nicely improving indicating AKI in the setting of hypercalcemia, volume depletion  Urine culture unremarkable, and complete empiric antibiotics   AKI Metabolic acidosis: Suspect multifactorial primarily in the setting of lytic lesions ?multiple myeloma, volume depletion.  Creatinine overall improving Likely has component of CKD-needs to be seen how much creatinine will improve.  Encourage p.o. intake, continue IVF Recent Labs    07/11/24 0140 07/11/24 1115 07/12/24 0814 07/13/24 0714 07/14/24 0521  BUN 51*  --  51* 46* 36*  CREATININE 2.31* 1.92* 2.05* 1.72* 1.45*  CO2 20*  --  19* 20* 22  K 4.3  --  4.2 4.1 4.4    Mildly prominent fatty liver Hiatal hernia: Continue  PPI.  LFTs fairly stable.  HFpEF Continue home Coreg  dose, holding LasiX , Losartan   Elevated troponin at 66 on admission: Recheck troponin elevated but flat-2D echo obtained with normal EF no RWMA   HTN: Stable.  PTA losartan  HCTZ on hold.  Continue Coreg    ADHD PTA Vyvanse .  Morbid Obesity w/ Body mass index is 51.65 kg/m.: Will benefit with PCP follow-up, weight loss,healthy lifestyle.  DVT prophylaxis:  Code Status:   Code Status: Full Code Family Communication: plan of care discussed with patient at bedside. Patient status is: Remains hospitalized because of severity of illness Level of care: Telemetry   Dispo: The patient is from: HOME            Anticipated disposition: TBD Objective: Vitals last 24 hrs: Vitals:   07/13/24 1814 07/13/24 1947 07/13/24 2317 07/14/24 0537  BP: (!) 155/105 (!) 137/92 (!) 144/89 121/88  Pulse: (!) 123 (!) 115 (!) 115 (!) 117  Resp:  18 17 17   Temp:  97.8 F (36.6 C) 98.1 F (36.7 C) 98.2 F (36.8 C)  TempSrc:   Oral Oral  SpO2:  100% 99% 98%  Weight:      Height:       Physical Examination: General exam: AAOX3,NAD HEENT:Oral mucosa moist, Ear/Nose WNL grossly Respiratory system: Bilaterally clear BS,no use of accessory muscle Cardiovascular system: S1 & S2 +, No JVD. Gastrointestinal system: Abdomen soft, obese NT,ND, BS+ Nervous System: Alert, awake, moving all extremities-mobility limited due to pain, sensation intact bilaterally in legs. Extremities: extremities warm, leg edema NEG Skin: Warm, no rashes MSK: Normal muscle bulk,tone, power   Medications reviewed:  Scheduled Meds:  acetaminophen   1,000 mg Oral TID   carvedilol   12.5 mg Oral BID WC   enoxaparin  (LOVENOX ) injection  70 mg Subcutaneous Q24H   feeding supplement  237 mL Oral BID BM   gabapentin   300 mg Oral TID   lisdexamfetamine   40 mg Oral Daily   methocarbamol   500 mg Oral TID   multivitamin with minerals  1 tablet Oral Daily   pantoprazole   40 mg Oral  Daily   Continuous Infusions:  sodium chloride  125 mL/hr at 07/14/24 0845   cefTRIAXone  (ROCEPHIN )  IV 1 g (07/14/24 0844)   Diet: Diet Order             Diet regular Room service appropriate? Yes; Fluid consistency: Thin  Diet effective now                   Data Reviewed: I have personally reviewed following labs and imaging studies ( see epic result tab) CBC: Recent Labs  Lab 07/11/24 0300 07/11/24 1115 07/12/24 0814 07/13/24 0714 07/14/24 0521  WBC 8.0 6.7 6.6 6.8 7.4  NEUTROABS 5.5  --   --  4.3  --   HGB 11.6* 11.3* 10.4* 10.1* 10.0*  HCT 34.0* 34.4* 31.6* 31.1* 30.7*  MCV 92.9 96.6 96.3 96.6 98.4  PLT 418* 362 368 378 364   CMP: Recent Labs  Lab 07/11/24 0140 07/11/24 1115 07/12/24 0814 07/12/24 1059 07/13/24 0714 07/14/24 0521  NA 136  --  135  --  139 138  K 4.3  --  4.2  --  4.1 4.4  CL 99  --  104  --  108 108  CO2 20*  --  19*  --  20* 22  GLUCOSE 105*  --  105*  --  96 108*  BUN 51*  --  51*  --  46* 36*  CREATININE 2.31* 1.92* 2.05*  --  1.72* 1.45*  CALCIUM 13.0*  --  12.5* 12.2* 12.1* 12.2*   GFR: Estimated Creatinine Clearance: 74 mL/min (A) (by C-G formula based on SCr of 1.45 mg/dL (H)). Recent Labs  Lab 07/11/24 0140 07/12/24 0814  AST 35 25  ALT 15 12  ALKPHOS 80 66  BILITOT 0.4 0.2  PROT 7.2 6.2*  ALBUMIN 4.6 3.9   No results for input(s): LIPASE, AMYLASE in the last 168 hours. No results for input(s): AMMONIA in the last 168 hours. Coagulation Profile: No results for input(s): INR, PROTIME in the last 168 hours. Unresulted Labs (From admission, onward)     Start     Ordered   07/18/24 0500  Creatinine, serum  (enoxaparin  (LOVENOX )    CrCl >/= 30 ml/min)  Weekly,   R     Comments: while on enoxaparin  therapy    07/11/24 0928   07/15/24 0500  Comprehensive metabolic panel with GFR  Daily,   R     Question:  Specimen collection method  Answer:  Lab=Lab collect   07/14/24 1137   07/15/24 0500  Magnesium  Tomorrow  morning,   R       Question:  Specimen collection method  Answer:  Lab=Lab collect   07/14/24 1137   07/15/24 0500  Phosphorus  Tomorrow morning,   R       Question:  Specimen collection method  Answer:  Lab=Lab collect   07/14/24 1137   07/13/24 0500  CBC  Daily,   R      07/12/24 0924   07/11/24 1001  Multiple Myeloma Panel (SPEP&IFE w/QIG)  Once,   R        07/11/24 1000   07/11/24 1001  Kappa/lambda light chains  Once,   R        07/11/24 1000   07/11/24 1001  UPEP/UIFE/Light Chains/TP, 24-Hr Ur  Once,   R        07/11/24 1000   07/11/24 0949  Urinalysis, w/ Reflex to Culture (Infection Suspected) -Urine, Catheterized  (Urine Labs)  Once,   R       Question:  Specimen Source  Answer:  Urine, Catheterized   07/11/24 0948   07/11/24 0100  CBC with Differential  Once,   STAT        07/11/24 0102           Antimicrobials/Microbiology: Anti-infectives (From admission, onward)    Start     Dose/Rate Route Frequency Ordered Stop   07/12/24 1000  cefTRIAXone  (ROCEPHIN ) 1 g in sodium chloride  0.9 % 100 mL IVPB        1 g 200 mL/hr over 30 Minutes Intravenous Every 24 hours 07/11/24 0948 07/16/24 0959   07/11/24 0115  cefTRIAXone  (ROCEPHIN ) 1 g in sodium chloride  0.9 % 100 mL IVPB        1 g 200 mL/hr over 30 Minutes Intravenous  Once 07/11/24 0102 07/11/24 0406         Component Value Date/Time   SDES  07/11/2024 0606    BLOOD LEFT ANTECUBITAL Performed at [facility], [address] [place], [place] [address]    [facility]  07/11/2024 0606    BOTTLES DRAWN AEROBIC AND ANAEROBIC Blood Culture adequate volume Performed at [facility], [address] [place], [place] [address]    CULT  07/11/2024 0606    NO GROWTH 3 DAYS Performed at [facility], [address] [place], [place] [address]    REPTSTATUS PENDING 07/11/2024 9393    Procedures:  Mennie LAMY, MD [facility] 07/14/2024, 11:37 AM   "

## 2024-07-14 NOTE — Evaluation (Signed)
 Occupational Therapy Evaluation Patient Details Name: Kathryn [name] MRN: [id number] DOB: [birth year] Today's Date: 07/14/2024   History of Present Illness   Pt is 43 yo presenting to [facility] [place] 12/26 due to sudden onset back pain found to have L1 compression fracture as well as multiplel lytic lesions consistent with MM or metastasis per MD note. PMH: HFpEF, HTN, HLD, DM II, OSA.     Clinical Impressions Jesus was evaluated s/p the above admission list. She lives alone and is indep at baseline. Upon evaluation the pt was limited by back pain, generalized weakness and poor activity tolerance. Overall she was able to complete most aspects of mobility and Adls with mod I and increased time for pain management. Educated pt on spinal precautions for comfort. Pt is mainly limited by STS transfers from lower surfaces, she would like to try a [facility] and needs a bariatric BSC for discharge home. Pt will benefit from continued acute OT services.       If plan is discharge home, recommend the following:   Assist for transportation     Functional Status Assessment   Patient has had a recent decline in their functional status and demonstrates the ability to make significant improvements in function in a reasonable and predictable amount of time.     Equipment Recommendations   BSC/3in1 cipriano)      Precautions/Restrictions   Precautions Precautions: Fall Precaution/Restrictions Comments:  (educated back precautions for comfort) Restrictions Weight Bearing Restrictions Per Provider Order: No     Mobility Bed Mobility Overal bed mobility: Modified Independent             General bed mobility comments: educated log roll for comfort    Transfers Overall transfer level: Needs assistance Equipment used: None Transfers: Sit to/from Stand Sit to Stand: Modified independent (Device/Increase time)           General transfer comment: pt used the back of a straight  back chair to stand. She would like to trial a SPC      Balance Overall balance assessment: Needs assistance Sitting-balance support: Feet supported Sitting balance-Leahy Scale: Good     Standing balance support: During functional activity, No upper extremity supported Standing balance-Leahy Scale: Fair                             ADL either performed or assessed with clinical judgement   ADL Overall ADL's : Modified independent                                       General ADL Comments: mod I, pt must put on socks at bed level, she can reach her feet better that way. needs DME for tranfers from lower surfaces     Vision Baseline Vision/History: 0 No visual deficits Vision Assessment?: No apparent visual deficits     Perception Perception: Within Functional Limits       Praxis Praxis: WFL       Pertinent Vitals/Pain Pain Assessment Pain Assessment: Faces Faces Pain Scale: Hurts a little bit Pain Location: back Pain Descriptors / Indicators: Discomfort Pain Intervention(s): Limited activity within patient's tolerance, Monitored during session     Extremity/Trunk Assessment Upper Extremity Assessment Upper Extremity Assessment: Overall WFL for tasks assessed   Lower Extremity Assessment Lower Extremity Assessment: Defer to PT evaluation   Cervical / Trunk  Assessment Cervical / Trunk Assessment: Other exceptions Cervical / Trunk Exceptions: body habitus +L1 compression fx   Communication Communication Communication: No apparent difficulties   Cognition Arousal: Alert Behavior During Therapy: WFL for tasks assessed/performed Cognition: No apparent impairments             Following commands: Intact       Cueing  General Comments      VSS on RA, IV looked like it was falling out. RN made aware           Home Living Family/patient expects to be discharged to:: Private residence Living Arrangements: Alone   Type of  Home: Other(Comment) (extended stay motel) Home Access: Elevator     Home Layout: One level     Bathroom Shower/Tub: Chief Strategy Officer: Standard     Home Equipment: None   Additional Comments: has a kitckenette, full fride, stove and full bathroom in motel room      Prior Functioning/Environment Prior Level of Function : Independent/Modified Independent;Working/employed             Mobility Comments: no AD ADLs Comments: mod I, does not drive, works from home    OT Problem List: Decreased activity tolerance;Pain   OT Treatment/Interventions: Self-care/ADL training;DME and/or AE instruction;Therapeutic activities      OT Goals(Current goals can be found in the care plan section)   Acute Rehab OT Goals Patient Stated Goal: home OT Goal Formulation: With patient Time For Goal Achievement: 07/28/24 Potential to Achieve Goals: Good ADL Goals Additional ADL Goal #1: pt will tolerate at least 8 minutes of OOB functional activity to demonstrate improved endurance   OT Frequency:  Min 1X/week       AM-PAC OT 6 Clicks Daily Activity     Outcome Measure Help from another person eating meals?: None Help from another person taking care of personal grooming?: None Help from another person toileting, which includes using toliet, bedpan, or urinal?: None Help from another person bathing (including washing, rinsing, drying)?: None Help from another person to put on and taking off regular upper body clothing?: None Help from another person to put on and taking off regular lower body clothing?: None 6 Click Score: 24   End of Session Nurse Communication: Mobility status  Activity Tolerance: Patient tolerated treatment well Patient left: in chair  OT Visit Diagnosis: Muscle weakness (generalized) (M62.81);Pain                Time: 8946-8880 OT Time Calculation (min): 26 min Charges:  OT General Charges $OT Visit: 1 Visit OT Evaluation $OT Eval  Moderate Complexity: 1 Mod OT Treatments $Self Care/Home Management : 8-22 mins  Lucie Kendall, OTR/L Acute Rehabilitation Services Office [phone] Secure Chat Communication Preferred   Lucie JONETTA Kendall 07/14/2024, 12:00 PM

## 2024-07-14 NOTE — Evaluation (Signed)
 Physical Therapy Brief Evaluation and Discharge Note Patient Details Name: Kathryn [name] MRN: [id number] DOB: 1980/09/26 Today's Date: 07/14/2024   History of Present Illness  Pt is 43 yo presenting to [facility] 12/26 due to sudden onset back pain found to have L1 compression fracture as well as multiplel lytic lesions consistent with MM or metastasis per MD note. PMH: HFpEF, HTN, HLD, DM II, OSA.  Clinical Impression  Pt is currently mod I for bed mobility, sit to stand and gait. Pt reports improvement in pain with sit to stand/gait with RW. Pt was educated on the use of RW for standing and not to pull up with both hands for stability; pt did well with SPC but due to pain relief with RW and assistance with standing pt will benefit from RW for home use. Due to pt current functional status, home set up and available assistance at home recommending skilled physical therapy services 3x/week in order to address strength, pain and functional mobility to decrease risk for falls, injury and re-hospitalization.  Currently pt is Mod I for mobility and will be discharge from skilled physical therapy services in acute care setting; please re-consult if further needs arise.         PT Assessment All further PT needs can be met in the next venue of care  Assistance Needed at Discharge  PRN    Equipment Recommendations Rolling walker (2 wheels);BSC/3in1;Other (comment) (shower chair if possible)     Precautions/Restrictions Precautions Precautions: Fall Recall of Precautions/Restrictions: Intact Restrictions Weight Bearing Restrictions Per Provider Order: No        Mobility  Bed Mobility Rolling: Modified independent (Device/Increase time) Supine/Sidelying to sit: Modified independent (Device/Increased time) Sit to supine/sidelying: Modified independent (Device/Increased time) General bed mobility comments: increased time; pt naturally performs log roll technique to get in/out of bed  for pain management.  Transfers Overall transfer level: Needs assistance Equipment used: None Transfers: Sit to/from Stand Sit to Stand: Modified independent (Device/Increase time)           General transfer comment: increased time to get to standing trialed with [facility] and with RW.    Ambulation/Gait Ambulation/Gait assistance: Modified independent (Device/Increase time) Gait Distance (Feet): 20 Feet Assistive device: Rolling walker (2 wheels) Gait Pattern/deviations: Step-through pattern, Decreased stride length Gait Speed: Below normal General Gait Details: attempted gait wtih and without RW. Pt reports improvement in LB pain with RW.     Balance Overall balance assessment: Modified Independent, No apparent balance deficits (not formally assessed) Sitting-balance support: Feet supported Sitting balance-Leahy Scale: Good     Standing balance support: During functional activity, No upper extremity supported Standing balance-Leahy Scale: Fair            Pertinent Vitals/Pain   Pain Assessment Pain Assessment: 0-10 Pain Score: 6  Pain Location: back Pain Descriptors / Indicators: Discomfort, Aching Pain Intervention(s): Monitored during session, Limited activity within patient's tolerance     Home Living Family/patient expects to be discharged to:: Private residence Living Arrangements: Alone   Home Environment: Level entry;Other (comment) (elevator)   Home Equipment: None   Additional Comments: has a kitckenette, full fride, stove and full bathroom in motel room    Prior Function Level of Independence: Independent with assistive device(s) Comments: uses bedside table and fan next to bed to help stabilize when standing.    UE/LE Assessment   UE ROM/Strength/Tone/Coordination: [facility]    LE ROM/Strength/Tone/Coordination: [facility]      Communication   Communication Communication:  No apparent difficulties     Cognition Overall Cognitive Status: Appears  within functional limits for tasks assessed/performed       General Comments General comments (skin integrity, edema, etc.): No signs/symptoms of cardiac/respiratory distress during session.        Assessment/Plan    PT Problem List Decreased strength;Decreased balance;Decreased activity tolerance;Pain;Decreased mobility       PT Visit Diagnosis Other abnormalities of gait and mobility (R26.89);Pain      AMPAC 6 Clicks Help needed turning from your back to your side while in a flat bed without using bedrails?: None Help needed moving from lying on your back to sitting on the side of a flat bed without using bedrails?: None Help needed moving to and from a bed to a chair (including a wheelchair)?: None Help needed standing up from a chair using your arms (e.g., wheelchair or bedside chair)?: None Help needed to walk in hospital room?: None Help needed climbing 3-5 steps with a railing? : A Lot 6 Click Score: 22      End of Session Equipment Utilized During Treatment: Gait belt Activity Tolerance: Patient tolerated treatment well Patient left: in bed;with call bell/phone within reach Nurse Communication: Mobility status PT Visit Diagnosis: Other abnormalities of gait and mobility (R26.89);Pain Pain - part of body:  (back pain)     Time: 8770-8746 PT Time Calculation (min) (ACUTE ONLY): 24 min  Charges:   PT Evaluation $PT Eval Low Complexity: 1 Low PT Treatments $Therapeutic Activity: 8-22 mins   Dorothyann Maier, DPT, CLT  Acute Rehabilitation Services Office: [phone] (Secure chat preferred)   Dorothyann VEAR Maier  07/14/2024, 1:29 PM

## 2024-07-15 ENCOUNTER — Other Ambulatory Visit (HOSPITAL_COMMUNITY): Payer: Self-pay

## 2024-07-15 DIAGNOSIS — M8448XA Pathological fracture, other site, initial encounter for fracture: Secondary | ICD-10-CM | POA: Diagnosis not present

## 2024-07-15 LAB — PHOSPHORUS: Phosphorus: 3.6 mg/dL (ref 2.5–4.6)

## 2024-07-15 LAB — MAGNESIUM: Magnesium: 1.8 mg/dL (ref 1.7–2.4)

## 2024-07-15 LAB — KAPPA/LAMBDA LIGHT CHAINS
Kappa free light chain: 8340.8 mg/L — ABNORMAL HIGH (ref 3.3–19.4)
Kappa, lambda light chain ratio: 1158.44 — ABNORMAL HIGH (ref 0.26–1.65)
Lambda free light chains: 7.2 mg/L (ref 5.7–26.3)

## 2024-07-15 LAB — COMPREHENSIVE METABOLIC PANEL WITH GFR
ALT: 14 U/L (ref 0–44)
AST: 21 U/L (ref 15–41)
Albumin: 3.9 g/dL (ref 3.5–5.0)
Alkaline Phosphatase: 68 U/L (ref 38–126)
Anion gap: 10 (ref 5–15)
BUN: 29 mg/dL — ABNORMAL HIGH (ref 6–20)
CO2: 21 mmol/L — ABNORMAL LOW (ref 22–32)
Calcium: 11.8 mg/dL — ABNORMAL HIGH (ref 8.9–10.3)
Chloride: 109 mmol/L (ref 98–111)
Creatinine, Ser: 1.29 mg/dL — ABNORMAL HIGH (ref 0.44–1.00)
GFR, Estimated: 53 mL/min — ABNORMAL LOW
Glucose, Bld: 103 mg/dL — ABNORMAL HIGH (ref 70–99)
Potassium: 4.2 mmol/L (ref 3.5–5.1)
Sodium: 139 mmol/L (ref 135–145)
Total Bilirubin: <0.2 mg/dL (ref 0.0–1.2)
Total Protein: 6.2 g/dL — ABNORMAL LOW (ref 6.5–8.1)

## 2024-07-15 LAB — BETA 2 MICROGLOBULIN, SERUM: Beta-2 Microglobulin: 10 mg/L — ABNORMAL HIGH (ref 0.6–2.4)

## 2024-07-15 LAB — SURGICAL PATHOLOGY

## 2024-07-15 MED ORDER — OXYCODONE HCL 5 MG PO TABS
5.0000 mg | ORAL_TABLET | ORAL | 0 refills | Status: AC | PRN
  Filled 2024-07-15: qty 30, 5d supply, fill #0

## 2024-07-15 MED ORDER — ENSURE PLUS HIGH PROTEIN PO LIQD: 237.0000 mL | Freq: Two times a day (BID) | ORAL | Status: AC

## 2024-07-15 MED ORDER — ACETAMINOPHEN 325 MG PO TABS: 650.0000 mg | ORAL_TABLET | Freq: Four times a day (QID) | ORAL | Status: AC | PRN

## 2024-07-15 MED ORDER — METHOCARBAMOL 500 MG PO TABS
500.0000 mg | ORAL_TABLET | Freq: Three times a day (TID) | ORAL | 0 refills | Status: DC | PRN
  Filled 2024-07-15: qty 90, 30d supply, fill #0

## 2024-07-15 MED ORDER — GABAPENTIN 300 MG PO CAPS
300.0000 mg | ORAL_CAPSULE | Freq: Three times a day (TID) | ORAL | 0 refills | Status: DC
  Filled 2024-07-15: qty 90, 30d supply, fill #0

## 2024-07-15 NOTE — Discharge Summary (Signed)
 Physician Discharge Summary  Naoko Diperna Colonna FMW:[id number] DOB: May 12, 1981 DOA: 07/11/2024  PCP: Gerome Brunet, DO  Admit date: 07/11/2024 Discharge date: 07/15/2024 Recommendations for Outpatient Follow-up:  Follow up with PCP in 1 weeks-call for appointment Follow-up Dr Federico hem on next week for biopsy results and further plan  Please obtain BMP/CBC in one week  Discharge Dispo: home w/ hh Discharge Condition: Stable Code Status:   Code Status: Full Code Diet recommendation:  Diet Order             Diet regular Room service appropriate? Yes; Fluid consistency: Thin  Diet effective now                    Brief/Interim Summary: Everli Rother is a 43 y.o. female with PMH of HFpEF (EF 65-70% in 2015), HTN, HLD, DM2, OSA (not on CPAP), and morbid obesity (BMI 51) who presented w/ back pain and found to have L1 compression fracture as well as multiple lytic lesions consistent with MM or metastasis. Presented to the ED 12/26 with lower back pain onset ~ 1-2 wks ago,coincided with the mid-cycle of their menstrual period. While standing and washing dishes, the patient heard a pop and felt a crackling sensation in the back, accompanied by the worst pain they had ever experienced. In the ED : afebrile, tachycardic, and hypertensive.  Labs-mild anemia AKI with creat of 2.31 (baseline 0.8-0.9). Troponin 66. UA with only small leukocyte esterase. CT abdomen and pelvis>>multiple concerning findings of L1 compression fracture, multiple lytic lesions suspicious for metastasis or myeloma chronic bilateral hydronephrosis, left perinephric stranding, chronic or inflammatory. At [facility] ED started IV Rocephin , IV NS bolus, and admitted to [facility]  Urology, hematology oncology and IR consulted-underwent bone marrow biopsy 12/29. Due to persistent hypercalcemia, given Zometa  12/13 by hematology oncology They are planning for outpatient follow-up for biopsy result and  further management Patient has been cleared for discharge from oncology and they will plan on outpatient follow-up  Subjective: Seen and examined Overall she feels much improved, pain is controlled, eager to go home today Overnight febrile heart rate 105-116, BP stable on room air, labs/calcium improved to 11.8 mag phos stable, creat further improved 1.2  Discharge Diagnoses:   Pathologic L1 compression fracture Multiple lytic lesions Acute low back pain: Oncology following finding are concerning for multiple myeloma versus metastatic disease.  CT of the chest for staging completed 12/27-expansile lytic lesions of the right first rib and right fourth rib sternum left eighth rib T1 vertebral body with pathologic fractures and mild loss of height. Multiple myeloma panel,SPEP UPEP, serum free light chain pending. Beta2 microglobulin elevated ~ 10. BM  biopsy done 12/29-results pending.  Continue multimodal pain management with prn oxycodone , Robaxin  Tylenol  1 g tid,gabapentin  . PT OT to eval-see lives in extended stay facility/home. Due to persistent hypercalcemia, given Zometa  12/13 by hematology oncology They are planning for outpatient follow-up for biopsy result and further management  Hypercalcemia 13: Suspect due to patient's skeletal finding with lytic lesions.Calcium improving > 11.8, iPTH < 6 suppressed appropriately S/P Zometa  12/30.  Calcium improving encourage oral hydration, remained on IV fluid hydration while inpatient-follow-up with hem onc with labs Hold vitamin D  supplementation.. Recent Labs  Lab 07/11/24 0140 07/12/24 0814 07/12/24 1059 07/13/24 0714 07/14/24 0521 07/15/24 0543  K 4.3 4.2  --  4.1 4.4 4.2  CALCIUM 13.0* 12.5* 12.2* 12.1* 12.2* 11.8*  MG  --   --   --   --   --  1.8  PHOS  --   --   --   --   --  3.6       Component Value Date/Time   PTH <6 (L) 07/12/2024 1059   PTH Comment 07/12/2024 1059   Chronic bilateral hydronephrosis Left perinephric  stranding Bilateral UPJ obstruction Atrophic right kidney: Seen by urology finding are chronic with preservation of parenchyma in the left kidney indicating low-grade obstruction if any. No urological intervention planned at this time.  Creatinine is nicely improving indicating AKI in the setting of hypercalcemia, volume depletion  Urine culture unremarkable,  recheck UA showed large pyuria more than 50 bacteria and large LE she will compelted 7 days of antibiotics   AKI Metabolic acidosis: Suspect multifactorial primarily in the setting of lytic lesions ?multiple myeloma, volume depletion.  Overall much improved 1.2 Likely has component of CKD-needs to be seen how much creatinine will improve.  Encourage p.o. intake.  Holding her Lasix  and Hyzaar Recent Labs    07/11/24 0140 07/11/24 1115 07/12/24 0814 07/13/24 0714 07/14/24 0521 07/15/24 0543  BUN 51*  --  51* 46* 36* 29*  CREATININE 2.31* 1.92* 2.05* 1.72* 1.45* 1.29*  CO2 20*  --  19* 20* 22 21*  K 4.3  --  4.2 4.1 4.4 4.2    Mildly prominent fatty liver Hiatal hernia: Continue PPI.  LFTs fairly stable.  HFpEF Continue home Coreg  dose, holding LasiX , Losartan   Elevated troponin at 66 on admission: Recheck troponin elevated but flat-2D echo obtained with normal EF no RWMA   HTN: Stable.  PTA losartan  HCTZ and Lasix  on hold.  Continue Coreg    ADHD PTA Vyvanse .  Morbid Obesity w/ Body mass index is 51.65 kg/m.: Will benefit with PCP follow-up, weight loss,healthy lifestyle.  Deconditioning debility back pain due to above: Mobility: PT Follow up Rec: Home Health Pt12/30/2025 1328    DVT prophylaxis:  Code Status:   Code Status: Full Code Family Communication: plan of care discussed with patient at bedside. Patient status is: Remains hospitalized because of severity of illness Level of care: Telemetry   Dispo: The patient is from: HOME            Anticipated disposition: home w/ HH  Objective: Vitals last 24  hrs: Vitals:   07/14/24 1818 07/14/24 2032 07/15/24 0614 07/15/24 0904  BP: (!) 143/76 123/67 132/89 (!) 146/96  Pulse: (!) 116 (!) 125 (!) 105 (!) 116  Resp:  17 17 17   Temp:  97.9 F (36.6 C) 97.9 F (36.6 C) 98.1 F (36.7 C)  TempSrc:    Oral  SpO2:  97% 99% 97%  Weight:      Height:       Physical Examination: General exam: AAOX3 HEENT:Oral mucosa moist, Ear/Nose WNL grossly Respiratory system: Bilaterally clear. Cardiovascular system: S1 & S2 +, No JVD. Gastrointestinal system: Abdomen soft, obese NT,ND, BS+ Nervous System: Alert, awake, moving all extremities non focal Extremities: extremities warm, leg edema NEG Skin: Warm, no rashes MSK: Normal muscle bulk,tone, power     Consultation: See note.  Discharge Instructions  Discharge Instructions     Discharge instructions   Complete by: As directed    Please call call MD or return to ER for similar or worsening recurring problem that brought you to hospital or if any fever,nausea/vomiting,abdominal pain, uncontrolled pain, chest pain,  shortness of breath or any other alarming symptoms.  Please follow-up your doctor as instructed in a week time and call the office for appointment.  Please avoid alcohol, smoking, or any other illicit substance and maintain healthy habits including taking your regular medications as prescribed.  You were cared for by a hospitalist during your hospital stay. If you have any questions about your discharge medications or the care you received while you were in the hospital after you are discharged, you can call the unit and ask to speak with the hospitalist on call if the hospitalist that took care of you is not available.  Once you are discharged, your primary care physician will handle any further medical issues. Please note that NO REFILLS for any discharge medications will be authorized once you are discharged, as it is imperative that you return to your primary care physician (or  establish a relationship with a primary care physician if you do not have one) for your aftercare needs so that they can reassess your need for medications and monitor your lab values   Increase activity slowly   Complete by: As directed    No wound care   Complete by: As directed       Allergies as of 07/15/2024       Reactions   Pravastatin Other (See Comments)   Got pancreatitis   Floxin [ofloxacin] Swelling   Side of face corresponding to ear being treated swelled after usage. Ok with po quinolones   Lisinopril  Swelling   Sulfa Antibiotics Hives        Medication List     PAUSE taking these medications    furosemide  40 MG tablet Wait to take this until your doctor or other care provider tells you to start again. Commonly known as: LASIX  Take 40 mg by mouth daily after breakfast.       STOP taking these medications    HYDROcodone -acetaminophen  5-325 MG tablet Commonly known as: NORCO/VICODIN   ibuprofen  600 MG tablet Commonly known as: ADVIL    losartan -hydrochlorothiazide  100-12.5 MG tablet Commonly known as: HYZAAR   Vitamin D -3 25 MCG (1000 UT) Caps       TAKE these medications    acetaminophen  325 MG tablet Commonly known as: TYLENOL  Take 2 tablets (650 mg total) by mouth every 6 (six) hours as needed.   carvedilol  12.5 MG tablet Commonly known as: COREG  Take 12.5 mg by mouth 2 (two) times daily with a meal.   esomeprazole 20 MG capsule Commonly known as: NEXIUM Take 20 mg by mouth daily before breakfast.   famotidine  20 MG tablet Commonly known as: PEPCID  Take 20 mg by mouth daily after breakfast.   feeding supplement Liqd Take 237 mLs by mouth 2 (two) times daily between meals.   gabapentin  300 MG capsule Commonly known as: NEURONTIN  Take 1 capsule (300 mg total) by mouth 3 (three) times daily.   lisdexamfetamine  40 MG capsule Commonly known as: VYVANSE  Take 40 mg by mouth every morning.   methocarbamol  500 MG tablet Commonly  known as: ROBAXIN  Take 1 tablet (500 mg total) by mouth every 8 (eight) hours as needed for up to 90 doses for muscle spasms. What changed:  when to take this reasons to take this   Mounjaro 7.5 MG/0.5ML Pen Generic drug: tirzepatide Inject 7.5 mg into the skin once a week.   oxyCODONE  5 MG immediate release tablet Commonly known as: Oxy IR/ROXICODONE  Take 1 tablet (5 mg total) by mouth every 4 (four) hours as needed for up to 5 days for severe pain (pain score 7-10).   valACYclovir 500 MG tablet Commonly known as: VALTREX Take 500  mg by mouth daily.               Durable Medical Equipment  (From admission, onward)           Start     Ordered   07/14/24 1353  For home use only DME Shower stool  Once       Comments: Bariatric  Ht 5'6  Wt 319.44 pounds   07/14/24 1352   07/14/24 1352  For home use only DME Walker rolling  Once       Comments: Bariatric  Ht 5'6  Wt 319.44 pounds  Question Answer Comment  Walker: With 5 Inch Wheels   Patient needs a walker to treat with the following condition Weakness      07/14/24 1352   07/14/24 1347  For home use only DME Bedside commode  Once       Comments: Bariatric  Ht 5'6   Wt 319.44 pounds  Question:  Patient needs a bedside commode to treat with the following condition  Answer:  Weakness   07/14/24 1352            Contact information for follow-up providers     Federico Norleen ONEIDA MADISON, MD Follow up in 5 day(s).   Specialty: Hematology and Oncology Contact information: [address] [place] [address] [phone]              Contact information for after-discharge care     Durable Medical Equipment     [employer] (DME) Follow up.   Service: Durable Medical Equipment Why: Company bariatric rolling walker, bariatric shower seat and bariatric bedside commode was ordered through [employer] information: [address] [employer] [place]   [phone]             Home Medical Care     [facility] [place] [facility]) .   Service: Home Health Services Contact information: [address] [place]  [address] [phone]                    Allergies[1]  The results of significant diagnostics from this hospitalization (including imaging, microbiology, ancillary and laboratory) are listed below for reference.    Microbiology: Recent Results (from the past 240 hours)  Urine Culture     Status: Abnormal   Collection Time: 07/11/24  5:47 AM   Specimen: Urine, Clean Catch  Result Value Ref Range Status   Specimen Description   Final    URINE, CLEAN CATCH Performed at [facility], [address] [place], [place] [address]    Special Requests   Final    NONE Performed at [facility], [address] [place], [place] [address]    Culture (A)  Final    <10,000 COLONIES/mL INSIGNIFICANT GROWTH Performed at [facility], [address] [place], [place] [address]    Report Status 07/12/2024 FINAL  Final  Blood culture (routine x 2)     Status: None (Preliminary result)   Collection Time: 07/11/24  6:06 AM   Specimen: BLOOD  Result Value Ref Range Status   Specimen Description   Final    BLOOD LEFT ANTECUBITAL Performed at [facility], [address] [place], [place] [address]    Special Requests   Final    BOTTLES DRAWN AEROBIC AND ANAEROBIC Blood Culture adequate volume Performed at [facility], [address] [place], [place] [address]    Culture   Final    NO GROWTH 4 DAYS Performed at [facility], [address], [place], [place] [address]    Report Status PENDING  Incomplete    Procedures/Studies: IR BONE MARROW BIOPSY Result Date: 07/13/2024 INDICATION: Back pain, lytic bony lesions, acute kidney injury and concern for multiple myeloma versus metastatic disease. Planned image  guided bone marrow biopsy for diagnosis. EXAM: Bone marrow biopsy FLUOROSCOPIC GUIDANCE ANESTHESIA/SEDATION: Moderate (conscious) sedation was employed during this procedure. A total of Versed  1.5 mg and Fentanyl  50 mcg was administered intravenously by the radiology nurse. Total intra-service moderate Sedation Time: 10 minutes. The patient's level of consciousness and vital signs were monitored continuously by radiology nursing throughout the procedure under my direct supervision. FLUOROSCOPY: Radiation Exposure Index (as provided by the fluoroscopic device): 33 mGy Kerma COMPLICATIONS: None immediate. PROCEDURE: The patient was advised of the possible risks and complications and agreed to undergo the procedure. The patient was then brought to the angiographic suite for the procedure. Patient was placed in a prone position on the fluoroscopic suite. Fluoroscopy demonstrated access and the gluteal region was prepped and draped in the usual sterile fashion. Local anesthesia was achieved with 1% lidocaine  by infiltrating subcutaneous tissue from the skin to the level of the end ostium overlying the left iliac bone. A small incision was made in the left gluteal region. 13 gauge needle was then advanced through the incision to the right iliac bone posteriorly under fluoroscopic guidance. The cortex was engaged and the needle was advanced into the bone marrow. A 2 mL sample was obtained and evaluated for spicules. A 7 mL sample was then obtained mixed with heparin . A core needle sample was then obtained of the bone marrow by advancing the needle 2 cm further into the posterior aspect of the left iliac bone. All needles withdrawn from the patient. Sterile dressing applied. IMPRESSION: Satisfactory aspirate and core sampling of the left iliac bone. Electronically Signed   By: Cordella Banner   On: 07/13/2024 20:59   ECHOCARDIOGRAM COMPLETE Result Date: 07/13/2024    ECHOCARDIOGRAM REPORT   Patient Name:   [facility]  [facility] Date of Exam: 07/12/2024 Medical Rec #:  [id number]                   Height:       66.0 in Accession #:    [id number]                  Weight:       320.0 lb Date of Birth:  11/06/1980                   BSA:          2.442 m Patient Age:    43 years                    BP:           128/82 mmHg Patient Gender: F                           HR:           124 bpm. Exam Location:  Inpatient Procedure: 2D Echo (Both Spectral and Color Flow Doppler were utilized during            procedure). Indications:    elevated troponin  History:        Patient has prior history of Echocardiogram examinations, most                 recent 11/11/2013. CHF, Pulmonary HTN, Signs/Symptoms:Edema; Risk                 Factors:Diabetes, Hypertension and Sleep Apnea.  Sonographer:    Tinnie Barefoot RDCS Referring Phys: [id number] Shavawn Stobaugh  Sonographer Comments: Patient is obese. Image acquisition challenging due to patient body habitus. IMPRESSIONS  1. Left ventricular ejection fraction, by estimation, is 65 to 70%. The left ventricle has normal function. The left ventricle has no regional wall motion abnormalities. There is moderate concentric left ventricular hypertrophy. Left ventricular diastolic parameters were normal.  2. Right ventricular systolic function is normal. The right ventricular size is normal. There is mildly elevated pulmonary artery systolic pressure. The estimated right ventricular systolic pressure is 38.3 mmHg.  3. There is no evidence of cardiac tamponade.  4. The mitral valve is normal in structure. No evidence of mitral valve regurgitation. No evidence of mitral stenosis.  5. The aortic valve is tricuspid. Aortic valve regurgitation is not visualized. No aortic stenosis is present.  6. The inferior vena cava is normal in size with greater than 50% respiratory variability, suggesting right atrial pressure of 3 mmHg. FINDINGS  Left Ventricle: Left ventricular ejection fraction, by estimation, is 65 to  70%. The left ventricle has normal function. The left ventricle has no regional wall motion abnormalities. The left ventricular internal cavity size was normal in size. There is  moderate concentric left ventricular hypertrophy. Left ventricular diastolic parameters were normal. Right Ventricle: The right ventricular size is normal. No increase in right ventricular wall thickness. Right ventricular systolic function is normal. There is mildly elevated pulmonary artery systolic pressure. The tricuspid regurgitant velocity is 2.97  m/s, and with an assumed right atrial pressure of 3 mmHg, the estimated right ventricular systolic pressure is 38.3 mmHg. Left Atrium: Left atrial size was normal in size. Right Atrium: Right atrial size was normal in size. Pericardium: Trivial pericardial effusion is present. The pericardial effusion is posterior and lateral to the left ventricle. There is no evidence of cardiac tamponade. Mitral Valve: The mitral valve is normal in structure. No evidence of mitral valve regurgitation. No evidence of mitral valve stenosis. Tricuspid Valve: The tricuspid valve is normal in structure. Tricuspid valve regurgitation is trivial. No evidence of tricuspid stenosis. Aortic Valve: The aortic valve is tricuspid. Aortic valve regurgitation is not visualized. No aortic stenosis is present. Pulmonic Valve: The pulmonic valve was normal in structure. Pulmonic valve regurgitation is not visualized. No evidence of pulmonic stenosis. Aorta: The aortic root is normal in size and structure. Venous: The inferior vena cava is normal in size with greater than 50% respiratory variability, suggesting right atrial pressure of 3 mmHg. IAS/Shunts: No atrial level shunt detected by color flow Doppler.  LEFT VENTRICLE PLAX 2D LVIDd:         3.60 cm LVIDs:         2.50 cm LV PW:         1.40 cm LV IVS:        1.40 cm LV IVRT:       95 msec  LV Volumes (MOD) LV vol d, MOD A2C: 66.6 ml LV vol d, MOD A4C: 69.1 ml LV vol  s, MOD A2C: 33.1 ml LV vol s, MOD A4C: 34.5 ml LV SV MOD A2C:  33.5 ml LV SV MOD A4C:     69.1 ml LV SV MOD BP:      35.4 ml RIGHT VENTRICLE             IVC RV Basal diam:  2.70 cm     IVC diam: 1.70 cm RV S prime:     11.20 cm/s TAPSE (M-mode): 1.7 cm LEFT ATRIUM           Index        RIGHT ATRIUM           Index LA diam:      4.30 cm 1.76 cm/m   RA Area:     13.50 cm LA Vol (A2C): 41.7 ml 17.08 ml/m  RA Volume:   34.60 ml  14.17 ml/m   AORTA Ao Root diam: 2.90 cm Ao Asc diam:  2.90 cm TRICUSPID VALVE TR Peak grad:   35.3 mmHg TR Vmax:        297.00 cm/s Oneil Parchment MD Electronically signed by Oneil Parchment MD Signature Date/Time: 07/13/2024/5:20:20 PM    Final    CT CHEST WO CONTRAST Result Date: 07/13/2024 EXAM: CT CHEST WITHOUT CONTRAST 07/11/2024 11:36:00 AM TECHNIQUE: CT of the chest was performed without the administration of intravenous contrast. Multiplanar reformatted images are provided for review. Automated exposure control, iterative reconstruction, and/or weight based adjustment of the mA/kV was utilized to reduce the radiation dose to as low as reasonably achievable. COMPARISON: None available. CLINICAL HISTORY: chronic dyspnea, back pain FINDINGS: MEDIASTINUM: Heart and pericardium are unremarkable. The central airways are clear. LYMPH NODES: No mediastinal, hilar or axillary lymphadenopathy. LUNGS AND PLEURA: Band like atelectasis within the right lower lobe. No focal consolidation or pulmonary edema. No pleural effusion or pneumothorax. SOFT TISSUES/BONES: Multiple healed left rib fractures. Expansile lytic lesion within the right first rib at the costochondral junction and right fourth rib anterolaterally, sternum, and left eighth rib posterolaterally, and T11 vertebral body which demonstrates pathologic fracture and a mild loss of height or in keeping with probably multifocal metastatic disease or multiple myeloma. UPPER ABDOMEN: Small hiatal hernia. Surgical changes of probable gastric  sleeve resection are identified. Atrophy of the right kidney and hydronephrosis of the left kidney are partially visualized, better assessed on CT examination of the abdomen and pelvis performed at 02:33 am. IMPRESSION: 1. Band-like atelectasis within the right lower lobe. 2. Expansile lytic lesions involving the right first rib at the costochondral junction, right fourth rib anterolaterally, sternum, left eighth rib posterolaterally, and T11 vertebral body with pathologic fracture and mild loss of height, concerning for multifocal metastatic disease or multiple myeloma. 3. Atrophy of the right kidney and hydronephrosis of the left kidney, partially visualized. 4. Multiple healed left rib fractures. 5. 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 Electronically signed by: Dorethia Molt MD 07/13/2024 12:43 AM EST RP Workstation: [id number]   CT ABDOMEN PELVIS WO CONTRAST Result Date: 07/11/2024 EXAM: CT ABDOMEN AND PELVIS WITHOUT CONTRAST 07/11/2024 03:28:00 AM TECHNIQUE: CT of the abdomen and pelvis was performed without the administration of intravenous contrast. Multiplanar reformatted images are provided for review. Automated exposure control, iterative reconstruction, and/or weight-based adjustment of the mA/kV was utilized to reduce the radiation dose to as low as reasonably achievable. COMPARISON: CT without contrast 07/06/2019, CT with contrast 03/31/2015. CLINICAL HISTORY: Low back pain onset last night, progressively worsening since. Unsure if this is a kidney stone or urinary tract infection. She also felt a painful pop in her  back last week. FINDINGS: LOWER CHEST: Mild atelectasis in the posterior lower lobes. Lung bases are clear of infiltrates with mild chronic elevation of the right diaphragm. The cardiac size is normal. There is a small hiatal hernia. There is calcification in the LAD (Left Anterior Descending) coronary artery. LIVER: The liver is enlarged,  measuring 23 cm in length, and is mildly steatotic. No mass is seen without contrast. GALLBLADDER AND BILE DUCTS: There are tiny stones layering posteriorly in the gallbladder, but no wall thickening or biliary dilatation. SPLEEN: The spleen is normal in size and noncontrast attenuation. PANCREAS: The pancreas is normal in size, shape and noncontrast attenuation. ADRENAL GLANDS: There is no adrenal mass. KIDNEYS, URETERS AND BLADDER: No renal mass is seen without contrast bilaterally. There is chronic moderate to severe dilatation in the right renal pelvis with patchy cortical scarring and volume loss of the right kidney. The right ureter is small in caliber without obstructing stone. This was seen previously, consistent with a chronic UPJ (Ureteropelvic Junction) stenosis. On the left, there is also chronic hydronephrosis ending at the UPJ but this kidney demonstrates compensatory hypertrophy without focal volume loss. There is increased left perinephric stranding since the prior study, which could be chronic or inflammatory. There is no nephrolithiasis, ureteral or bladder stones. There are left gonadal vein phleboliths. Multiple pelvic phleboliths. Urinary bladder is unremarkable. 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 Stomach demonstrates no acute abnormality. There is no small bowel obstruction or inflammation. The appendix is normal. There is sigmoid diverticulosis without diverticulitis. There is no bowel obstruction. PERITONEUM AND RETROPERITONEUM: No ascites. No free air. No free hemorrhage. No incarcerated hernia. VASCULATURE: Aorta is normal in caliber. There is mild aortic atherosclerosis without aneurysm. LYMPH NODES: No lymphadenopathy. REPRODUCTIVE ORGANS: No acute abnormality. BONES AND SOFT TISSUES: There is a new expansile intramedullary predominantly lucent/lytic lesion with areas of  cortical dehiscence involving the posterior left 8th rib measuring 5.3 x 1.5 cm, without an overt pathologic fracture.+ This is suspicious for primary or metastatic neoplasm including multiple myeloma. If this is metastatic disease, I do not see a primary source for it on this exam. In addition, there is evidence of a recent comminuted wedge compression fracture of the L1 vertebral body, with fragmentation centrally and mild edema in the paraspinal soft tissues without paraspinal hematoma. Loss of vertebral body height is 30% anteriorly, up to 70% centrally, and about 10% posteriorly with slight posterior superior retropulsion. I do not see a soft tissue component to this infiltrating the spinal canal. I suspect this was probably a pathologic fracture. There is a somewhat lucent appearance in the central L1 body. The L5 body demonstrates several small lytic lesions. There are lytic lesions in both medial iliac crests measuring 2.5 x 1 cm on the right and 1.2 cm on the left. No other focal bone lesion is seen. No focal soft tissue abnormality. IMPRESSION: 1. Recent comminuted wedge compression fracture of the L1 vertebral body with mild paraspinal soft tissue edema and slight retropulsion, likely pathologic. 2. No nephrolithiasis or obstructing ureteral stone. 3. New expansile intramedullary lytic lesion in the posterior left 8th rib, suspicious for neoplasm (including multiple myeloma or metastatic disease), without a primary malignancy identified in the abdomen or pelvis. 4. Additional lytic lesions in the L5 vertebral body and both medial iliac crests, suspicious for metastases or myeloma. 5. Chronic bilateral hydronephrosis with UPJ configuration,  including chronic moderate to severe right renal pelvic dilatation with cortical scarring and volume loss, and increased left perinephric stranding since the prior study, which may be chronic or inflammatory. 6. Mildly prominent fatty liver. 7. Hiatal hernia. 8. Aortic  and coronary artery atherosclerosis. Electronically signed by: Francis Quam MD 07/11/2024 04:36 AM EST RP Workstation: [id number]    Labs: BNP (last 3 results) No results for input(s): BNP in the last 8760 hours. Basic Metabolic Panel: Recent Labs  Lab 07/11/24 0140 07/11/24 1115 07/12/24 0814 07/12/24 1059 07/13/24 0714 07/14/24 0521 07/15/24 0543  NA 136  --  135  --  139 138 139  K 4.3  --  4.2  --  4.1 4.4 4.2  CL 99  --  104  --  108 108 109  CO2 20*  --  19*  --  20* 22 21*  GLUCOSE 105*  --  105*  --  96 108* 103*  BUN 51*  --  51*  --  46* 36* 29*  CREATININE 2.31* 1.92* 2.05*  --  1.72* 1.45* 1.29*  CALCIUM 13.0*  --  12.5* 12.2* 12.1* 12.2* 11.8*  MG  --   --   --   --   --   --  1.8  PHOS  --   --   --   --   --   --  3.6   Liver Function Tests: Recent Labs  Lab 07/11/24 0140 07/12/24 0814 07/15/24 0543  AST 35 25 21  ALT 15 12 14   ALKPHOS 80 66 68  BILITOT 0.4 0.2 <0.2  PROT 7.2 6.2* 6.2*  ALBUMIN 4.6 3.9 3.9   No results for input(s): LIPASE, AMYLASE in the last 168 hours. No results for input(s): AMMONIA in the last 168 hours. CBC: Recent Labs  Lab 07/11/24 0300 07/11/24 1115 07/12/24 0814 07/13/24 0714 07/14/24 0521  WBC 8.0 6.7 6.6 6.8 7.4  NEUTROABS 5.5  --   --  4.3  --   HGB 11.6* 11.3* 10.4* 10.1* 10.0*  HCT 34.0* 34.4* 31.6* 31.1* 30.7*  MCV 92.9 96.6 96.3 96.6 98.4  PLT 418* 362 368 378 364  Urinalysis    Component Value Date/Time   COLORURINE YELLOW 07/13/2024 1700   APPEARANCEUR CLOUDY (A) 07/13/2024 1700   LABSPEC 1.011 07/13/2024 1700   PHURINE 5.0 07/13/2024 1700   GLUCOSEU NEGATIVE 07/13/2024 1700   HGBUR MODERATE (A) 07/13/2024 1700   BILIRUBINUR NEGATIVE 07/13/2024 1700   KETONESUR NEGATIVE 07/13/2024 1700   PROTEINUR 100 (A) 07/13/2024 1700   UROBILINOGEN 1.0 03/30/2015 2320   NITRITE NEGATIVE 07/13/2024 1700   LEUKOCYTESUR LARGE (A) 07/13/2024 1700   Sepsis Labs Recent Labs  Lab 07/11/24 1115  07/12/24 0814 07/13/24 0714 07/14/24 0521  WBC 6.7 6.6 6.8 7.4   Microbiology Recent Results (from the past 240 hours)  Urine Culture     Status: Abnormal   Collection Time: 07/11/24  5:47 AM   Specimen: Urine, Clean Catch  Result Value Ref Range Status   Specimen Description   Final    URINE, CLEAN CATCH Performed at [facility], [address] [place], [place] [address]    Special Requests   Final    NONE Performed at [facility], [address] [place], [place] [address]    Culture (A)  Final    <10,000 COLONIES/mL INSIGNIFICANT GROWTH Performed at [facility], [address], [place], [place] [address]    Report  Status 07/12/2024 FINAL  Final  Blood culture (routine x 2)     Status: None (Preliminary result)   Collection Time: 07/11/24  6:06 AM   Specimen: BLOOD  Result Value Ref Range Status   Specimen Description   Final    BLOOD LEFT ANTECUBITAL Performed at [facility], [address] [place], [place] [address]    Special Requests   Final    BOTTLES DRAWN AEROBIC AND ANAEROBIC Blood Culture adequate volume Performed at [facility], [address] [place], [place] [address]    Culture   Final    NO GROWTH 4 DAYS Performed at [facility], [address] [place], [place] [address]    Report Status PENDING  Incomplete   Time coordinating discharge: 35  minutes SIGNED: Mennie LAMY, MD  [facility] 07/15/2024, 10:38 AM  If 7PM-7AM, please contact night-coverage [email or web address]       [1]  Allergies Allergen Reactions   Pravastatin Other (See Comments)    Got pancreatitis   Floxin [Ofloxacin] Swelling    Side of face corresponding to ear being treated swelled after usage. Ok with po quinolones   Lisinopril  Swelling   Sulfa Antibiotics Hives

## 2024-07-15 NOTE — TOC Progression Note (Signed)
 Transition of Care (TOC) - Progression Note   Followed up with patient , the bari walker that is to be delivered to patient's room has not arrived . Patient also needing bari 3 in 1 delivered to [address] [place], [place] [address]   NCM left voicemail and text for [place] with [employer] [phone]  Patient Details  Name: Kathryn [name] MRN: [id number] Date of Birth: 1981-07-12  Transition of Care [facility]) CM/SW Contact  Lovella Hardie, Powell Jansky, RN Phone Number: 07/15/2024, 9:48 AM  Clinical Narrative:       Expected Discharge Plan: Home w Home Health Services Barriers to Discharge: Continued Medical Work up               Expected Discharge Plan and Services   Discharge Planning Services: CM Consult Post Acute Care Choice: Home Health Living arrangements for the past 2 months: Hotel/Motel                 DME Arranged: 3-N-1, Walker rolling, Shower stool DME Agency: [employer] Date DME Agency Contacted: 07/14/24 Time DME Agency Contacted: 1424 Representative spoke with at DME Agency: [place] HH Arranged: PT HH Agency: [facility] Date [facility] Agency Contacted: 07/14/24 Time HH Agency Contacted: 1424 Representative spoke with at [facility] Agency: [place]   Social Drivers of Health (SDOH) Interventions SDOH Screenings   Food Insecurity: No Food Insecurity (07/11/2024)  Housing: Unknown (07/11/2024)  Transportation Needs: No Transportation Needs (07/11/2024)  Utilities: Not At Risk (07/11/2024)  Tobacco Use: Medium Risk (07/10/2024)    Readmission Risk Interventions     No data to display

## 2024-07-15 NOTE — Progress Notes (Signed)
 Discharge   Patient expressed verbal understanding of discharge POC.   Patient given time to ask any questions.  Additional education included in AVS.  Alert oriented in good spirits.   Tele and PIV removed. Pressure dressings intact.  All personal belongings at bedtime. [employer] delivered [employer] and will deliver [place] [facility] 3[facility]1 to home address.  Patient will need PTAR for transport.  RN updated and working on transportation.  TOC meds at bedside.

## 2024-07-15 NOTE — Plan of Care (Signed)

## 2024-07-15 NOTE — Progress Notes (Addendum)
 Discharge Summary: DC order noted per MD. DC RN at bedside. Discussed patient transportation plan with primary RN. Confirmed patient plans to transport home via friend. Discussed and confirmed plan with the patient. Adjusted RW appropriate to patient ht. Patient wheeled downstairs to DC lounge to await friend pickup. TOC meds present with the patient.   Rosario Lund, RN

## 2024-07-16 LAB — URINE CULTURE: Culture: <10000 — AB

## 2024-07-16 LAB — CULTURE, BLOOD (ROUTINE X 2)
Culture: NO GROWTH
Special Requests: ADEQUATE

## 2024-07-17 ENCOUNTER — Telehealth: Payer: Self-pay | Admitting: Hematology and Oncology

## 2024-07-17 LAB — MULTIPLE MYELOMA PANEL, SERUM
Albumin SerPl Elph-Mcnc: 3.4 g/dL (ref 2.9–4.4)
Albumin/Glob SerPl: 1.2 (ref 0.7–1.7)
Alpha 1: 0.3 g/dL (ref 0.0–0.4)
Alpha2 Glob SerPl Elph-Mcnc: 0.9 g/dL (ref 0.4–1.0)
B-Globulin SerPl Elph-Mcnc: 1.3 g/dL (ref 0.7–1.3)
Gamma Glob SerPl Elph-Mcnc: 0.5 g/dL (ref 0.4–1.8)
Globulin, Total: 3 g/dL (ref 2.2–3.9)
IgA: 18 mg/dL — ABNORMAL LOW (ref 87–352)
IgG (Immunoglobin G), Serum: 445 mg/dL — ABNORMAL LOW (ref 586–1602)
IgM (Immunoglobulin M), Srm: 19 mg/dL — ABNORMAL LOW (ref 26–217)
M Protein SerPl Elph-Mcnc: 0.2 g/dL — ABNORMAL HIGH
Total Protein ELP: 6.4 g/dL (ref 6.0–8.5)

## 2024-07-17 NOTE — Telephone Encounter (Signed)
 I left voicemail for patient lab and MD appointments on 07/22/2024 per staff message.

## 2024-07-20 ENCOUNTER — Encounter (HOSPITAL_COMMUNITY): Payer: Self-pay

## 2024-07-20 ENCOUNTER — Other Ambulatory Visit: Payer: Self-pay | Admitting: Hematology and Oncology

## 2024-07-20 DIAGNOSIS — M898X9 Other specified disorders of bone, unspecified site: Secondary | ICD-10-CM

## 2024-07-20 NOTE — Progress Notes (Unsigned)
 " [facility] Telephone:([phone]) [phone]   [phone]) [phone]  PROGRESS NOTE  Patient Care Team: Gerome Brunet, DO as PCP - General (Family Medicine)  Hematological/Oncological History # Free Kappa Multiple Myeloma  07/11/2024: establish care in hospital for back pain/lytic lesions 07/13/2024: IR guided bone marrow biopsy showed Kappa restricted plasma cell myeloma involving approximately 90% of a hypercellular (90%) with significantly decreased myeloid and erythroid precursors   Interval History:  Kathryn [name] 44 y.o. female with medical history significant for free kappa multiple myeloma who presents for a follow up visit. The patient's last visit was on 07/11/2024 in the hospital. In the interim since the last visit her results returned most consistent with multiple myeloma.  On exam today Kathryn [name] reports she continues to be in pain in the room since our last visit.  She reports that she has been taken about 6 of the oxycodone  per day.  She is taking them faithfully as prescribed every 4 hours.  She reports that she feels like her pain is worse in the morning and she is quite uncomfortable to matter how she tries to sleep.  She reports that she is not having any nerve issues such as numbness or tingling of the fingers or toes or weakness of the lower extremities.  Otherwise she has been well with the exception of the pain.  She denies any fevers, chills, sweats, nausea, vomiting or diarrhea.  The bulk of our discussion focused on the diagnosis of multiple myeloma and treatment plans moving forward.  Given her kidney dysfunction I would recommend avoiding Revlimid and pursuing a cyclophosphamide regimen instead.  Quadruple therapy would be preferred and therefore would recommend starting with Dara CyBorD with the intention of converting to Dara VRD once kidney function is adequate.  Additionally discussed the supportive medications and the need for chemotherapy  education.  Offered the patient a port and she declined at this time noting that if it became necessary she would consider in the future.  She would prefer to be treated on Thursdays.  Details of our conversation are noted below.  MEDICAL HISTORY:  Past Medical History:  Diagnosis Date   Anemia    history only - no current problem   CHF (congestive heart failure) (HCC)    ECHO cardiogram schedule 07/11/11   Diabetes mellitus    no med - last A1C was 5.9  on 04/2011   Hyperlipidemia    Hypertension    Sleep apnea    does not use CPAP   UTI (lower urinary tract infection)    Vitamin D  deficiency     SURGICAL HISTORY: Past Surgical History:  Procedure Laterality Date   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    SOCIAL HISTORY: Social History   Socioeconomic History   Marital status: Married    Spouse name: Not on file   Number of children: Not on file   Years of education: Not on file   Highest education level: Not on file  Occupational History   Not on file  Tobacco Use   Smoking status: Former    Current packs/day: 0.00    Average packs/day: 0.3 packs/day for 1 year (0.3 ttl pk-yrs)  Types: Cigarettes    Start date: 12/15/1999    Quit date: 12/14/2000    Years since quitting: 23.6   Smokeless tobacco: Never  Substance and Sexual Activity   Alcohol use: Yes    Comment: Rarely - twice a year   Drug use: No   Sexual activity: Never    Birth control/protection: Condom  Other Topics Concern   Not on file  Social History Narrative   Not on file   Social Drivers of Health   Tobacco Use: Medium Risk (07/10/2024)   Patient History    Smoking Tobacco Use: Former    Smokeless Tobacco Use: Never    Passive Exposure: Not  on Actuary Strain: Not on file  Food Insecurity: No Food Insecurity (07/11/2024)   Epic    Worried About Programme Researcher, Broadcasting/film/video in the Last Year: Never true    Ran Out of Food in the Last Year: Never true  Transportation Needs: No Transportation Needs (07/11/2024)   Epic    Lack of Transportation (Medical): No    Lack of Transportation (Non-Medical): No  Physical Activity: Not on file  Stress: Not on file  Social Connections: Not on file  Intimate Partner Violence: Not At Risk (07/11/2024)   Epic    Fear of Current or Ex-Partner: No    Emotionally Abused: No    Physically Abused: No    Sexually Abused: No  Depression (PHQ2-9): Low Risk (07/22/2024)   Depression (PHQ2-9)    PHQ-2 Score: 0  Alcohol Screen: Not on file  Housing: Unknown (07/11/2024)   Epic    Unable to Pay for Housing in the Last Year: No    Number of Times Moved in the Last Year: Not on file    Homeless in the Last Year: No  Utilities: Not At Risk (07/11/2024)   Epic    Threatened with loss of utilities: No  Health Literacy: Not on file    FAMILY HISTORY: No family history on file.  ALLERGIES:  is allergic to pravastatin, floxin [ofloxacin], lisinopril , and sulfa antibiotics.  MEDICATIONS:  Current Outpatient Medications  Medication Sig Dispense Refill   allopurinol  (ZYLOPRIM ) 300 MG tablet Take 1 tablet (300 mg total) by mouth daily. 90 tablet 0   dexamethasone  (DECADRON ) 4 MG tablet Take 10 tablets (40 mg total) by mouth once a week. 40 tablet 5   losartan -hydrochlorothiazide  (HYZAAR) 100-12.5 MG tablet Take 1 tablet by mouth daily.     ondansetron  (ZOFRAN ) 8 MG tablet Take 1 tablet (8 mg total) by mouth every 8 (eight) hours as needed. 30 tablet 0   prochlorperazine  (COMPAZINE ) 10 MG tablet Take 1 tablet (10 mg total) by mouth every 6 (six) hours as needed for nausea or vomiting. 30 tablet 0   senna-docusate (SENNA S) 8.6-50 MG tablet Take 2 tablets by mouth at bedtime. 100 tablet 3    acetaminophen  (TYLENOL ) 325 MG tablet Take 2 tablets (650 mg total) by mouth every 6 (six) hours as needed.     carvedilol  (COREG ) 12.5 MG tablet Take 12.5 mg by mouth 2 (two) times daily with a meal.     esomeprazole (NEXIUM) 20 MG capsule Take 20 mg by mouth daily before breakfast.     famotidine  (PEPCID ) 20 MG tablet Take 20 mg by mouth daily after breakfast.     feeding supplement (ENSURE PLUS HIGH PROTEIN) LIQD Take 237 mLs by mouth 2 (two) times daily between meals.     [Paused] furosemide  (LASIX ) 40  MG tablet Take 40 mg by mouth daily after breakfast.     gabapentin  (NEURONTIN ) 300 MG capsule Take 1 capsule (300 mg total) by mouth 3 (three) times daily. 90 capsule 0   lisdexamfetamine  (VYVANSE ) 40 MG capsule Take 40 mg by mouth every morning.     methocarbamol  (ROBAXIN ) 500 MG tablet Take 1 tablet (500 mg total) by mouth every 8 (eight) hours as needed for up to 90 doses for muscle spasms. 90 tablet 0   oxyCODONE  (OXY IR/ROXICODONE ) 5 MG immediate release tablet Take 1-2 tablets (5-10 mg total) by mouth every 4 (four) hours as needed for severe pain (pain score 7-10). 90 tablet 0   tirzepatide (MOUNJARO) 7.5 MG/0.5ML Pen Inject 7.5 mg into the skin once a week.     valACYclovir (VALTREX) 500 MG tablet Take 500 mg by mouth daily.     No current facility-administered medications for this visit.    REVIEW OF SYSTEMS:   Constitutional: ( - ) fevers, ( - )  chills , ( - ) night sweats Eyes: ( - ) blurriness of vision, ( - ) double vision, ( - ) watery eyes Ears, nose, mouth, throat, and face: ( - ) mucositis, ( - ) sore throat Respiratory: ( - ) cough, ( - ) dyspnea, ( - ) wheezes Cardiovascular: ( - ) palpitation, ( - ) chest discomfort, ( - ) lower extremity swelling Gastrointestinal:  ( - ) nausea, ( - ) heartburn, ( - ) change in bowel habits Skin: ( - ) abnormal skin rashes Lymphatics: ( - ) new lymphadenopathy, ( - ) easy bruising Neurological: ( - ) numbness, ( - ) tingling, ( - )  new weaknesses Behavioral/Psych: ( - ) mood change, ( - ) new changes  All other systems were reviewed with the patient and are negative.  PHYSICAL EXAMINATION: Vitals:   07/22/24 1120  BP: (!) 152/88  Pulse: (!) 106  Resp: 19  Temp: 97.8 F (36.6 C)  SpO2: 97%   Filed Weights   07/22/24 1120  Weight: (!) 328 lb 4.8 oz (148.9 kg)    GENERAL: Well-appearing middle-age African-American female alert, no distress and comfortable SKIN: skin color, texture, turgor are normal, no rashes or significant lesions EYES: conjunctiva are pink and non-injected, sclera clear LUNGS: clear to auscultation and percussion with normal breathing effort HEART: regular rate & rhythm and no murmurs and no lower extremity edema Musculoskeletal: no cyanosis of digits and no clubbing  PSYCH: alert & oriented x 3, fluent speech NEURO: no focal motor/sensory deficits  LABORATORY DATA:  I have reviewed the data as listed    Latest Ref Rng & Units 07/22/2024   10:36 AM 07/14/2024    5:21 AM 07/13/2024    7:14 AM  CBC  WBC 4.0 - 10.5 K/uL 7.5  7.4  6.8   Hemoglobin 12.0 - 15.0 g/dL 9.6  89.9  89.8   Hematocrit 36.0 - 46.0 % 28.6  30.7  31.1   Platelets 150 - 400 K/uL 376  364  378        Latest Ref Rng & Units 07/22/2024   10:36 AM 07/15/2024    5:43 AM 07/14/2024    5:21 AM  CMP  Glucose 70 - 99 mg/dL 98  896  891   BUN 6 - 20 mg/dL 36  29  36   Creatinine 0.44 - 1.00 mg/dL 8.20  8.70  8.54   Sodium 135 - 145 mmol/L 138  139  138   Potassium 3.5 - 5.1 mmol/L 3.9  4.2  4.4   Chloride 98 - 111 mmol/L 107  109  108   CO2 22 - 32 mmol/L 19  21  22    Calcium 8.9 - 10.3 mg/dL 8.2  88.1  87.7   Total Protein 6.5 - 8.1 g/dL 6.7  6.2    Total Bilirubin 0.0 - 1.2 mg/dL 0.3  <9.7    Alkaline Phos 38 - 126 U/L 72  68    AST 15 - 41 U/L 19  21    ALT 0 - 44 U/L 11  14      Lab Results  Component Value Date   MPROTEIN 0.2 (H) 07/11/2024   Lab Results  Component Value Date   KPAFRELGTCHN 8,340.8  (H) 07/11/2024   LAMBDASER 7.2 07/11/2024   KAPLAMBRATIO 1,158.44 (H) 07/11/2024    RADIOGRAPHIC STUDIES: IR BONE MARROW BIOPSY Result Date: 07/13/2024 INDICATION: Back pain, lytic bony lesions, acute kidney injury and concern for multiple myeloma versus metastatic disease. Planned image guided bone marrow biopsy for diagnosis. EXAM: Bone marrow biopsy FLUOROSCOPIC GUIDANCE ANESTHESIA/SEDATION: Moderate (conscious) sedation was employed during this procedure. A total of Versed  1.5 mg and Fentanyl  50 mcg was administered intravenously by the radiology nurse. Total intra-service moderate Sedation Time: 10 minutes. The patient's level of consciousness and vital signs were monitored continuously by radiology nursing throughout the procedure under my direct supervision. FLUOROSCOPY: Radiation Exposure Index (as provided by the fluoroscopic device): 33 mGy Kerma COMPLICATIONS: None immediate. PROCEDURE: The patient was advised of the possible risks and complications and agreed to undergo the procedure. The patient was then brought to the angiographic suite for the procedure. Patient was placed in a prone position on the fluoroscopic suite. Fluoroscopy demonstrated access and the gluteal region was prepped and draped in the usual sterile fashion. Local anesthesia was achieved with 1% lidocaine  by infiltrating subcutaneous tissue from the skin to the level of the end ostium overlying the left iliac bone. A small incision was made in the left gluteal region. 13 gauge needle was then advanced through the incision to the right iliac bone posteriorly under fluoroscopic guidance. The cortex was engaged and the needle was advanced into the bone marrow. A 2 mL sample was obtained and evaluated for spicules. A 7 mL sample was then obtained mixed with heparin . A core needle sample was then obtained of the bone marrow by advancing the needle 2 cm further into the posterior aspect of the left iliac bone. All needles withdrawn  from the patient. Sterile dressing applied. IMPRESSION: Satisfactory aspirate and core sampling of the left iliac bone. Electronically Signed   By: Cordella Banner   On: 07/13/2024 20:59   ECHOCARDIOGRAM COMPLETE Result Date: 07/13/2024    ECHOCARDIOGRAM REPORT   Patient Name:   Kathryn [name] Date of Exam: 07/12/2024 Medical Rec #:  [id number]                   Height:       66.0 in Accession #:    [id number]                  Weight:       320.0 lb Date of Birth:  11-23-1980                   BSA:          2.442 m Patient Age:    38 years  BP:           128/82 mmHg Patient Gender: F                           HR:           124 bpm. Exam Location:  Inpatient Procedure: 2D Echo (Both Spectral and Color Flow Doppler were utilized during            procedure). Indications:    elevated troponin  History:        Patient has prior history of Echocardiogram examinations, most                 recent 11/11/2013. CHF, Pulmonary HTN, Signs/Symptoms:Edema; Risk                 Factors:Diabetes, Hypertension and Sleep Apnea.  Sonographer:    Tinnie Barefoot RDCS Referring Phys: [id number] RAMESH KC  Sonographer Comments: Patient is obese. Image acquisition challenging due to patient body habitus. IMPRESSIONS  1. Left ventricular ejection fraction, by estimation, is 65 to 70%. The left ventricle has normal function. The left ventricle has no regional wall motion abnormalities. There is moderate concentric left ventricular hypertrophy. Left ventricular diastolic parameters were normal.  2. Right ventricular systolic function is normal. The right ventricular size is normal. There is mildly elevated pulmonary artery systolic pressure. The estimated right ventricular systolic pressure is 38.3 mmHg.  3. There is no evidence of cardiac tamponade.  4. The mitral valve is normal in structure. No evidence of mitral valve regurgitation. No evidence of mitral stenosis.  5. The aortic valve is tricuspid.  Aortic valve regurgitation is not visualized. No aortic stenosis is present.  6. The inferior vena cava is normal in size with greater than 50% respiratory variability, suggesting right atrial pressure of 3 mmHg. FINDINGS  Left Ventricle: Left ventricular ejection fraction, by estimation, is 65 to 70%. The left ventricle has normal function. The left ventricle has no regional wall motion abnormalities. The left ventricular internal cavity size was normal in size. There is  moderate concentric left ventricular hypertrophy. Left ventricular diastolic parameters were normal. Right Ventricle: The right ventricular size is normal. No increase in right ventricular wall thickness. Right ventricular systolic function is normal. There is mildly elevated pulmonary artery systolic pressure. The tricuspid regurgitant velocity is 2.97  m/s, and with an assumed right atrial pressure of 3 mmHg, the estimated right ventricular systolic pressure is 38.3 mmHg. Left Atrium: Left atrial size was normal in size. Right Atrium: Right atrial size was normal in size. Pericardium: Trivial pericardial effusion is present. The pericardial effusion is posterior and lateral to the left ventricle. There is no evidence of cardiac tamponade. Mitral Valve: The mitral valve is normal in structure. No evidence of mitral valve regurgitation. No evidence of mitral valve stenosis. Tricuspid Valve: The tricuspid valve is normal in structure. Tricuspid valve regurgitation is trivial. No evidence of tricuspid stenosis. Aortic Valve: The aortic valve is tricuspid. Aortic valve regurgitation is not visualized. No aortic stenosis is present. Pulmonic Valve: The pulmonic valve was normal in structure. Pulmonic valve regurgitation is not visualized. No evidence of pulmonic stenosis. Aorta: The aortic root is normal in size and structure. Venous: The inferior vena cava is normal in size with greater than 50% respiratory variability, suggesting right atrial  pressure of 3 mmHg. IAS/Shunts: No atrial level shunt detected by color flow Doppler.  LEFT VENTRICLE PLAX 2D LVIDd:  3.60 cm LVIDs:         2.50 cm LV PW:         1.40 cm LV IVS:        1.40 cm LV IVRT:       95 msec  LV Volumes (MOD) LV vol d, MOD A2C: 66.6 ml LV vol d, MOD A4C: 69.1 ml LV vol s, MOD A2C: 33.1 ml LV vol s, MOD A4C: 34.5 ml LV SV MOD A2C:     33.5 ml LV SV MOD A4C:     69.1 ml LV SV MOD BP:      35.4 ml RIGHT VENTRICLE             IVC RV Basal diam:  2.70 cm     IVC diam: 1.70 cm RV S prime:     11.20 cm/s TAPSE (M-mode): 1.7 cm LEFT ATRIUM           Index        RIGHT ATRIUM           Index LA diam:      4.30 cm 1.76 cm/m   RA Area:     13.50 cm LA Vol (A2C): 41.7 ml 17.08 ml/m  RA Volume:   34.60 ml  14.17 ml/m   AORTA Ao Root diam: 2.90 cm Ao Asc diam:  2.90 cm TRICUSPID VALVE TR Peak grad:   35.3 mmHg TR Vmax:        297.00 cm/s Oneil Parchment MD Electronically signed by Oneil Parchment MD Signature Date/Time: 07/13/2024/5:20:20 PM    Final    CT CHEST WO CONTRAST Result Date: 07/13/2024 EXAM: CT CHEST WITHOUT CONTRAST 07/11/2024 11:36:00 AM TECHNIQUE: CT of the chest was performed without the administration of intravenous contrast. Multiplanar reformatted images are provided for review. Automated exposure control, iterative reconstruction, and/or weight based adjustment of the mA/kV was utilized to reduce the radiation dose to as low as reasonably achievable. COMPARISON: None available. CLINICAL HISTORY: chronic dyspnea, back pain FINDINGS: MEDIASTINUM: Heart and pericardium are unremarkable. The central airways are clear. LYMPH NODES: No mediastinal, hilar or axillary lymphadenopathy. LUNGS AND PLEURA: Band like atelectasis within the right lower lobe. No focal consolidation or pulmonary edema. No pleural effusion or pneumothorax. SOFT TISSUES/BONES: Multiple healed left rib fractures. Expansile lytic lesion within the right first rib at the costochondral junction and right fourth rib  anterolaterally, sternum, and left eighth rib posterolaterally, and T11 vertebral body which demonstrates pathologic fracture and a mild loss of height or in keeping with probably multifocal metastatic disease or multiple myeloma. UPPER ABDOMEN: Small hiatal hernia. 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 Atrophy of the right kidney and hydronephrosis of the left kidney are partially visualized, better assessed on CT examination of the abdomen and pelvis performed at 02:33 am. IMPRESSION: 1. Band-like atelectasis within the right lower lobe. 2. Expansile lytic lesions involving the right first rib at the costochondral junction, right fourth rib anterolaterally, sternum, left eighth rib posterolaterally, and T11 vertebral body with pathologic fracture and mild loss of height, concerning for multifocal metastatic disease or multiple myeloma. 3. Atrophy of the right kidney and hydronephrosis of the left kidney, partially visualized. 4. Multiple healed left rib fractures. 5. 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 Electronically signed by: Dorethia Molt MD 07/13/2024 12:43 AM EST RP Workstation: [id number]   CT ABDOMEN PELVIS WO CONTRAST Result Date: 07/11/2024 EXAM: CT ABDOMEN AND PELVIS WITHOUT CONTRAST 07/11/2024 03:28:00  AM TECHNIQUE: CT of the abdomen and pelvis was performed without the administration of intravenous contrast. Multiplanar reformatted images are provided for review. Automated exposure control, iterative reconstruction, and/or weight-based adjustment of the mA/kV was utilized to reduce the radiation dose to as low as reasonably achievable. COMPARISON: CT without contrast 07/06/2019, CT with contrast 03/31/2015. CLINICAL HISTORY: Low back pain onset last night, progressively worsening since. Unsure if this is a kidney stone or urinary tract infection. She also felt a painful pop in her back last week. FINDINGS: LOWER CHEST: Mild  atelectasis in the posterior lower lobes. Lung bases are clear of infiltrates with mild chronic elevation of the right diaphragm. The cardiac size is normal. There is a small hiatal hernia. There is calcification in the LAD (Left Anterior Descending) coronary artery. LIVER: The liver is enlarged, measuring 23 cm in length, and is mildly steatotic. No mass is seen without contrast. GALLBLADDER AND BILE DUCTS: There are tiny stones layering posteriorly in the gallbladder, but no wall thickening or biliary dilatation. SPLEEN: The spleen is normal in size and noncontrast attenuation. PANCREAS: The pancreas is normal in size, shape and noncontrast attenuation. ADRENAL GLANDS: There is no adrenal mass. KIDNEYS, URETERS AND BLADDER: No renal mass is seen without contrast bilaterally. There is chronic moderate to severe dilatation in the right renal pelvis with patchy cortical scarring and volume loss of the right kidney. The right ureter is small in caliber without obstructing stone. This was seen previously, consistent with a chronic UPJ (Ureteropelvic Junction) stenosis. On the left, there is also chronic hydronephrosis ending at the UPJ but this kidney demonstrates compensatory hypertrophy without focal volume loss. There is increased left perinephric stranding since the prior study, which could be chronic or inflammatory. There is no nephrolithiasis, ureteral or bladder stones. There are left gonadal vein phleboliths. Multiple pelvic phleboliths. Urinary bladder is unremarkable. 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 Stomach demonstrates no acute abnormality. There is no small bowel obstruction or inflammation. The appendix is normal. There is sigmoid diverticulosis without diverticulitis. There is no bowel obstruction. PERITONEUM AND RETROPERITONEUM: No ascites. No free air. No free hemorrhage. No  incarcerated hernia. VASCULATURE: Aorta is normal in caliber. There is mild aortic atherosclerosis without aneurysm. LYMPH NODES: No lymphadenopathy. REPRODUCTIVE ORGANS: No acute abnormality. BONES AND SOFT TISSUES: There is a new expansile intramedullary predominantly lucent/lytic lesion with areas of cortical dehiscence involving the posterior left 8th rib measuring 5.3 x 1.5 cm, without an overt pathologic fracture.+ This is suspicious for primary or metastatic neoplasm including multiple myeloma. If this is metastatic disease, I do not see a primary source for it on this exam. In addition, there is evidence of a recent comminuted wedge compression fracture of the L1 vertebral body, with fragmentation centrally and mild edema in the paraspinal soft tissues without paraspinal hematoma. Loss of vertebral body height is 30% anteriorly, up to 70% centrally, and about 10% posteriorly with slight posterior superior retropulsion. I do not see a soft tissue component to this infiltrating the spinal canal. I suspect this was probably a pathologic fracture. There is a somewhat lucent appearance in the central L1 body. The L5 body demonstrates several small lytic lesions. There are lytic lesions in both medial iliac crests measuring 2.5 x 1 cm on the right and 1.2 cm on the left. No other focal bone lesion is seen. No focal soft tissue abnormality. IMPRESSION:  1. Recent comminuted wedge compression fracture of the L1 vertebral body with mild paraspinal soft tissue edema and slight retropulsion, likely pathologic. 2. No nephrolithiasis or obstructing ureteral stone. 3. New expansile intramedullary lytic lesion in the posterior left 8th rib, suspicious for neoplasm (including multiple myeloma or metastatic disease), without a primary malignancy identified in the abdomen or pelvis. 4. Additional lytic lesions in the L5 vertebral body and both medial iliac crests, suspicious for metastases or myeloma. 5. Chronic bilateral  hydronephrosis with UPJ configuration, including chronic moderate to severe right renal pelvic dilatation with cortical scarring and volume loss, and increased left perinephric stranding since the prior study, which may be chronic or inflammatory. 6. Mildly prominent fatty liver. 7. Hiatal hernia. 8. Aortic and coronary artery atherosclerosis. Electronically signed by: Francis Quam MD 07/11/2024 04:36 AM EST RP Workstation: [id number]    ASSESSMENT & PLAN Kathryn [name] 44 y.o. female with medical history significant for free kappa multiple myeloma who presents for a follow up visit.   #Free Kappa Multiple Myeloma  --today will order a UPEP --labs today show WBC 7.5, Hgb 9.6, MCV 92.9, Plt 376.  Creatinine 1.79 with normal LFTs --will plan to pursue DaraCyBorD chemotherapy due to kidney dysfunction and heavy burden of disease. Will consider exchanging Cyclophosphamide for revlimid once kidney function improves --RTC for the start of treatment.  Will plan for the start of treatment on either 07/30/2024 or 08/06/2024  #Hypercalcemia --received zometa  on 07/14/2024 --Request dental clearance prior to next dose --Ca today 8.2    #Pain Control -- Will call in oxycodone  5-10 mg every 4 hours as needed.  Will call in 90 tablets -- Referral to palliative care for assistance with pain control -- Referral to IR for consideration of kyphoplasty  #Supportive Care -- chemotherapy education to be scheduled  -- port placement optional.  Patient declined -- zofran  8mg  q8H PRN and compazine  10mg  PO q6H for nausea -- acyclovir normally recommended but patient is already taking valacyclovir.  Please assure she is still taking this. -- allopurinol  300mg  PO daily for TLS prophylaxis --patient received first dose of Zometa  06/2024. Request dental clearance prior to next dose.    Orders Placed This Encounter  Procedures   IR Radiologist Eval & Mgmt    Standing Status:   Future    Expiration  Date:   07/22/2025    Reason for Exam (SYMPTOM  OR DIAGNOSIS REQUIRED):   Patient has compression fractures in T11 and L1, requesting evaluation for kyphoplasty    Is the patient pregnant?:   No    Preferred Imaging Location?:   [facility]    All questions were answered. The patient knows to call the clinic with any problems, questions or concerns.  A total of more than 30 minutes were spent on this encounter with face-to-face time and non-face-to-face time, including preparing to see the patient, ordering tests and/or medications, counseling the patient and coordination of care as outlined above.   Norleen IVAR Kidney, MD Department of Hematology/Oncology [facility] at [facility] Phone: [phone] Pager: [phone] Email: [email or web address]  07/22/2024 7:38 PM  "

## 2024-07-21 ENCOUNTER — Other Ambulatory Visit: Payer: Self-pay | Admitting: Hematology and Oncology

## 2024-07-21 DIAGNOSIS — M898X9 Other specified disorders of bone, unspecified site: Secondary | ICD-10-CM

## 2024-07-22 ENCOUNTER — Inpatient Hospital Stay (HOSPITAL_BASED_OUTPATIENT_CLINIC_OR_DEPARTMENT_OTHER): Admitting: Hematology and Oncology

## 2024-07-22 ENCOUNTER — Other Ambulatory Visit (HOSPITAL_BASED_OUTPATIENT_CLINIC_OR_DEPARTMENT_OTHER): Payer: Self-pay

## 2024-07-22 ENCOUNTER — Other Ambulatory Visit: Payer: Self-pay | Admitting: *Deleted

## 2024-07-22 ENCOUNTER — Telehealth: Payer: Self-pay | Admitting: Nurse Practitioner

## 2024-07-22 ENCOUNTER — Inpatient Hospital Stay: Attending: Hematology and Oncology

## 2024-07-22 ENCOUNTER — Encounter (HOSPITAL_COMMUNITY): Payer: Self-pay

## 2024-07-22 VITALS — BP 152/88 | HR 106 | Temp 97.8°F | Resp 19 | Ht 66.0 in | Wt 328.3 lb

## 2024-07-22 DIAGNOSIS — I272 Pulmonary hypertension, unspecified: Secondary | ICD-10-CM | POA: Insufficient documentation

## 2024-07-22 DIAGNOSIS — C9000 Multiple myeloma not having achieved remission: Secondary | ICD-10-CM | POA: Insufficient documentation

## 2024-07-22 DIAGNOSIS — Z87440 Personal history of urinary (tract) infections: Secondary | ICD-10-CM | POA: Insufficient documentation

## 2024-07-22 DIAGNOSIS — N289 Disorder of kidney and ureter, unspecified: Secondary | ICD-10-CM | POA: Insufficient documentation

## 2024-07-22 DIAGNOSIS — E785 Hyperlipidemia, unspecified: Secondary | ICD-10-CM | POA: Insufficient documentation

## 2024-07-22 DIAGNOSIS — I110 Hypertensive heart disease with heart failure: Secondary | ICD-10-CM | POA: Insufficient documentation

## 2024-07-22 DIAGNOSIS — M898X9 Other specified disorders of bone, unspecified site: Secondary | ICD-10-CM | POA: Diagnosis not present

## 2024-07-22 DIAGNOSIS — Z79899 Other long term (current) drug therapy: Secondary | ICD-10-CM | POA: Insufficient documentation

## 2024-07-22 DIAGNOSIS — Z87891 Personal history of nicotine dependence: Secondary | ICD-10-CM | POA: Insufficient documentation

## 2024-07-22 DIAGNOSIS — E669 Obesity, unspecified: Secondary | ICD-10-CM | POA: Insufficient documentation

## 2024-07-22 DIAGNOSIS — E1129 Type 2 diabetes mellitus with other diabetic kidney complication: Secondary | ICD-10-CM | POA: Insufficient documentation

## 2024-07-22 DIAGNOSIS — Z5111 Encounter for antineoplastic chemotherapy: Secondary | ICD-10-CM | POA: Insufficient documentation

## 2024-07-22 DIAGNOSIS — G893 Neoplasm related pain (acute) (chronic): Secondary | ICD-10-CM | POA: Insufficient documentation

## 2024-07-22 LAB — CBC WITH DIFFERENTIAL (CANCER CENTER ONLY)
Abs Immature Granulocytes: 0.02 K/uL (ref 0.00–0.07)
Basophils Absolute: 0 K/uL (ref 0.0–0.1)
Basophils Relative: 0 %
Eosinophils Absolute: 0.2 K/uL (ref 0.0–0.5)
Eosinophils Relative: 3 %
HCT: 28.6 % — ABNORMAL LOW (ref 36.0–46.0)
Hemoglobin: 9.6 g/dL — ABNORMAL LOW (ref 12.0–15.0)
Immature Granulocytes: 0 %
Lymphocytes Relative: 14 %
Lymphs Abs: 1 K/uL (ref 0.7–4.0)
MCH: 31.2 pg (ref 26.0–34.0)
MCHC: 33.6 g/dL (ref 30.0–36.0)
MCV: 92.9 fL (ref 80.0–100.0)
Monocytes Absolute: 0.6 K/uL (ref 0.1–1.0)
Monocytes Relative: 8 %
Neutro Abs: 5.5 K/uL (ref 1.7–7.7)
Neutrophils Relative %: 75 %
Platelet Count: 376 K/uL (ref 150–400)
RBC: 3.08 MIL/uL — ABNORMAL LOW (ref 3.87–5.11)
RDW: 14.3 % (ref 11.5–15.5)
WBC Count: 7.5 K/uL (ref 4.0–10.5)
nRBC: 0 % (ref 0.0–0.2)

## 2024-07-22 LAB — CMP (CANCER CENTER ONLY)
ALT: 11 U/L (ref 0–44)
AST: 19 U/L (ref 15–41)
Albumin: 4.1 g/dL (ref 3.5–5.0)
Alkaline Phosphatase: 72 U/L (ref 38–126)
Anion gap: 13 (ref 5–15)
BUN: 36 mg/dL — ABNORMAL HIGH (ref 6–20)
CO2: 19 mmol/L — ABNORMAL LOW (ref 22–32)
Calcium: 8.2 mg/dL — ABNORMAL LOW (ref 8.9–10.3)
Chloride: 107 mmol/L (ref 98–111)
Creatinine: 1.79 mg/dL — ABNORMAL HIGH (ref 0.44–1.00)
GFR, Estimated: 35 mL/min — ABNORMAL LOW
Glucose, Bld: 98 mg/dL (ref 70–99)
Potassium: 3.9 mmol/L (ref 3.5–5.1)
Sodium: 138 mmol/L (ref 135–145)
Total Bilirubin: 0.3 mg/dL (ref 0.0–1.2)
Total Protein: 6.7 g/dL (ref 6.5–8.1)

## 2024-07-22 LAB — LACTATE DEHYDROGENASE: LDH: 234 U/L (ref 105–235)

## 2024-07-22 MED ORDER — PROCHLORPERAZINE MALEATE 10 MG PO TABS
10.0000 mg | ORAL_TABLET | Freq: Four times a day (QID) | ORAL | 0 refills | Status: AC | PRN
  Filled 2024-07-22: qty 30, 8d supply, fill #0

## 2024-07-22 MED ORDER — ALLOPURINOL 300 MG PO TABS
300.0000 mg | ORAL_TABLET | Freq: Every day | ORAL | 0 refills | Status: AC
  Filled 2024-07-22: qty 90, 90d supply, fill #0

## 2024-07-22 MED ORDER — DEXAMETHASONE 4 MG PO TABS
40.0000 mg | ORAL_TABLET | ORAL | 5 refills | Status: AC
  Filled 2024-07-22: qty 40, 28d supply, fill #0

## 2024-07-22 MED ORDER — OXYCODONE HCL 5 MG PO TABS
5.0000 mg | ORAL_TABLET | ORAL | 0 refills | Status: DC | PRN
  Filled 2024-07-22: qty 90, 8d supply, fill #0

## 2024-07-22 MED ORDER — SENNOSIDES-DOCUSATE SODIUM 8.6-50 MG PO TABS
2.0000 | ORAL_TABLET | Freq: Every day | ORAL | 3 refills | Status: AC
  Filled 2024-07-22: qty 100, 50d supply, fill #0

## 2024-07-22 MED ORDER — ONDANSETRON HCL 8 MG PO TABS
8.0000 mg | ORAL_TABLET | Freq: Three times a day (TID) | ORAL | 0 refills | Status: AC | PRN
  Filled 2024-07-22: qty 30, 10d supply, fill #0

## 2024-07-22 NOTE — Progress Notes (Signed)
 START OFF PATHWAY REGIMEN - Multiple Myeloma and Other Plasma Cell Dyscrasias   [id number]:DaraCyBord (Daratumumab SUBQ + Cyclophosphamide IV + Bortezomib SUBQ + Dexamethasone  PO/IV) q28 Days Followed by Daratumumab SUBQ q28 Days for up to 2 Years:   Cycles 1 and 2: A cycle is every 28 days:     Dexamethasone       Daratumumab and hyaluronidase-fihj      Cyclophosphamide      Bortezomib    Cycles 3 through 6: A cycle is every 28 days:     Dexamethasone       Dexamethasone       Daratumumab and hyaluronidase-fihj      Cyclophosphamide      Bortezomib    Cycles 7 and beyond (up to 2 years): A cycle is every 28 days:     Daratumumab and hyaluronidase-fihj   **Always confirm dose/schedule in your pharmacy ordering system**  Clinician Citation:   Patient Characteristics: Multiple Myeloma, Newly Diagnosed, Transplant Eligible, Unknown Risk or Awaiting Test Results Disease Classification: Multiple Myeloma Therapeutic Status: Newly Diagnosed R2-ISS Staging: II Is Patient Eligible for Transplant<= Transplant Eligible Risk Status: Awaiting Test Results Intent of Therapy: Curative Intent, Discussed with Patient

## 2024-07-23 ENCOUNTER — Encounter: Payer: Self-pay | Admitting: Hematology and Oncology

## 2024-07-23 ENCOUNTER — Other Ambulatory Visit: Payer: Self-pay

## 2024-07-23 ENCOUNTER — Other Ambulatory Visit: Payer: Self-pay | Admitting: Hematology and Oncology

## 2024-07-23 ENCOUNTER — Telehealth: Payer: Self-pay | Admitting: Hematology and Oncology

## 2024-07-23 ENCOUNTER — Other Ambulatory Visit (HOSPITAL_BASED_OUTPATIENT_CLINIC_OR_DEPARTMENT_OTHER): Payer: Self-pay

## 2024-07-23 DIAGNOSIS — C9000 Multiple myeloma not having achieved remission: Secondary | ICD-10-CM

## 2024-07-25 ENCOUNTER — Other Ambulatory Visit: Payer: Self-pay

## 2024-07-28 ENCOUNTER — Other Ambulatory Visit: Payer: Self-pay

## 2024-07-28 ENCOUNTER — Telehealth: Payer: Self-pay | Admitting: *Deleted

## 2024-07-28 NOTE — Telephone Encounter (Signed)
 Patient called with questions r/t appts this week as she begins chemotherapy. Reviewed appts this week for Education, Lab and first Infusion; the 4 medications sent to pharmacy for her.   She asked if message could be sent to scheduling to move her treatment appointments to Thursday as that is the day easiest to take off from work. The appts are all on Friday at this time.  Advised her schedule message will be sent with her request, but that moving appts impacts treatment plans due to intervals between treatments and usually needs MD approval. She verbalized understanding.  Schedule message sent w/CC to Dr. Noble staff

## 2024-07-29 ENCOUNTER — Inpatient Hospital Stay

## 2024-07-29 DIAGNOSIS — C9000 Multiple myeloma not having achieved remission: Secondary | ICD-10-CM

## 2024-07-29 LAB — CBC WITH DIFFERENTIAL (CANCER CENTER ONLY)
Abs Immature Granulocytes: 0.01 K/uL (ref 0.00–0.07)
Basophils Absolute: 0 K/uL (ref 0.0–0.1)
Basophils Relative: 1 %
Eosinophils Absolute: 0.2 K/uL (ref 0.0–0.5)
Eosinophils Relative: 3 %
HCT: 30.2 % — ABNORMAL LOW (ref 36.0–46.0)
Hemoglobin: 10 g/dL — ABNORMAL LOW (ref 12.0–15.0)
Immature Granulocytes: 0 %
Lymphocytes Relative: 10 %
Lymphs Abs: 0.7 K/uL (ref 0.7–4.0)
MCH: 30.8 pg (ref 26.0–34.0)
MCHC: 33.1 g/dL (ref 30.0–36.0)
MCV: 92.9 fL (ref 80.0–100.0)
Monocytes Absolute: 0.5 K/uL (ref 0.1–1.0)
Monocytes Relative: 8 %
Neutro Abs: 5.2 K/uL (ref 1.7–7.7)
Neutrophils Relative %: 78 %
Platelet Count: 392 K/uL (ref 150–400)
RBC: 3.25 MIL/uL — ABNORMAL LOW (ref 3.87–5.11)
RDW: 14.1 % (ref 11.5–15.5)
WBC Count: 6.7 K/uL (ref 4.0–10.5)
nRBC: 0 % (ref 0.0–0.2)

## 2024-07-29 LAB — CMP (CANCER CENTER ONLY)
ALT: 5 U/L (ref 0–44)
AST: 24 U/L (ref 15–41)
Albumin: 4.1 g/dL (ref 3.5–5.0)
Alkaline Phosphatase: 74 U/L (ref 38–126)
Anion gap: 20 — ABNORMAL HIGH (ref 5–15)
BUN: 32 mg/dL — ABNORMAL HIGH (ref 6–20)
CO2: 17 mmol/L — ABNORMAL LOW (ref 22–32)
Calcium: 8.1 mg/dL — ABNORMAL LOW (ref 8.9–10.3)
Chloride: 104 mmol/L (ref 98–111)
Creatinine: 2.39 mg/dL — ABNORMAL HIGH (ref 0.44–1.00)
GFR, Estimated: 25 mL/min — ABNORMAL LOW
Glucose, Bld: 84 mg/dL (ref 70–99)
Potassium: 4.1 mmol/L (ref 3.5–5.1)
Sodium: 141 mmol/L (ref 135–145)
Total Bilirubin: 0.4 mg/dL (ref 0.0–1.2)
Total Protein: 6.8 g/dL (ref 6.5–8.1)

## 2024-07-29 LAB — TYPE AND SCREEN
ABO/RH(D): O POS
Antibody Screen: NEGATIVE

## 2024-07-30 ENCOUNTER — Other Ambulatory Visit (HOSPITAL_BASED_OUTPATIENT_CLINIC_OR_DEPARTMENT_OTHER): Payer: Self-pay

## 2024-07-30 ENCOUNTER — Encounter: Payer: Self-pay | Admitting: Hematology and Oncology

## 2024-07-30 ENCOUNTER — Telehealth: Payer: Self-pay

## 2024-07-30 LAB — PRETREATMENT RBC PHENOTYPE

## 2024-07-30 NOTE — Telephone Encounter (Signed)
 [facility] Clinical Social Work  Clinical Social Work was referred by distress screen protocol for distress screen needs.  Clinical Social Worker attempted to contact patient by phone to offer support and assess for needs.  . No answer. CSW left general vm for purpose of call.   Follow Up Plan:  CSW will attempt to reach patient again.    Lizbeth Sprague, LCSW  Clinical Social Worker [facility]

## 2024-07-30 NOTE — Progress Notes (Addendum)
 Pharmacist Chemotherapy Monitoring - Initial Assessment    Anticipated start date: 07/31/24   The following has been reviewed per standard work regarding the patient's treatment regimen: The patient's diagnosis, treatment plan and drug doses, and organ/hematologic function Lab orders and baseline tests specific to treatment regimen  Pretreatment RBC phenotype + T&S done 1/14 The treatment plan start date, drug sequencing, and pre-medications Prior authorization status  Patient's documented medication list, including drug-drug interaction screen and prescriptions for anti-emetics and supportive care specific to the treatment regimen The drug concentrations, fluid compatibility, administration routes, and timing of the medications to be used The patient's access for treatment and lifetime cumulative dose history, if applicable  The patient's medication allergies and previous infusion related reactions, if applicable   Changes made to treatment plan:  N/A  Follow up needed:  Release pregnancy tests from pretreatment cycle? - secure chat sent to Dr. Federico Harlene JONELLE Tamela, [facility], 07/30/2024  2:49 PM  ADDENDUM: Per Dr. Federico, pregnancy tests released and will begin on 1/23. Pregnancy test not needed prior to treatment on 1/16.  Harlene Tamela, PharmD Oncology Infusion Pharmacist 07/31/2024 7:53 AM

## 2024-07-31 ENCOUNTER — Telehealth: Payer: Self-pay

## 2024-07-31 ENCOUNTER — Inpatient Hospital Stay

## 2024-07-31 ENCOUNTER — Other Ambulatory Visit: Payer: Self-pay | Admitting: Physician Assistant

## 2024-07-31 ENCOUNTER — Other Ambulatory Visit: Payer: Self-pay | Admitting: *Deleted

## 2024-07-31 ENCOUNTER — Telehealth: Payer: Self-pay | Admitting: Hematology and Oncology

## 2024-07-31 DIAGNOSIS — C9000 Multiple myeloma not having achieved remission: Secondary | ICD-10-CM

## 2024-07-31 NOTE — Telephone Encounter (Signed)
 [facility] Clinical Social Work  Clinical Social Work was referred by distress screen protocol for distress screen needs.  Clinical Social Worker attempted to contact patient by phone to offer support and assess for needs.  CSW provided general support services number if assistance is needed.   Lizbeth Sprague, LCSW  Clinical Social Worker [facility]

## 2024-07-31 NOTE — Telephone Encounter (Signed)
 I called patient because she was late for her infusion appointment. I left a Voicemail asking  her to call  back at [phone]. I sent a message to her providers nurse and to scheduling to follow up.

## 2024-07-31 NOTE — Telephone Encounter (Signed)
 Spoke to pt Rescheduling treatment

## 2024-08-04 ENCOUNTER — Other Ambulatory Visit: Payer: Self-pay

## 2024-08-07 ENCOUNTER — Telehealth: Payer: Self-pay

## 2024-08-07 ENCOUNTER — Inpatient Hospital Stay: Admitting: Physician Assistant

## 2024-08-07 ENCOUNTER — Inpatient Hospital Stay

## 2024-08-07 VITALS — BP 151/85 | HR 108 | Temp 97.6°F | Resp 18 | Ht 66.0 in | Wt 302.8 lb

## 2024-08-07 DIAGNOSIS — C9000 Multiple myeloma not having achieved remission: Secondary | ICD-10-CM

## 2024-08-07 LAB — CBC WITH DIFFERENTIAL (CANCER CENTER ONLY)
Abs Immature Granulocytes: 0.01 K/uL (ref 0.00–0.07)
Basophils Absolute: 0 K/uL (ref 0.0–0.1)
Basophils Relative: 1 %
Eosinophils Absolute: 0.2 K/uL (ref 0.0–0.5)
Eosinophils Relative: 3 %
HCT: 30.3 % — ABNORMAL LOW (ref 36.0–46.0)
Hemoglobin: 10.1 g/dL — ABNORMAL LOW (ref 12.0–15.0)
Immature Granulocytes: 0 %
Lymphocytes Relative: 12 %
Lymphs Abs: 0.8 K/uL (ref 0.7–4.0)
MCH: 29.9 pg (ref 26.0–34.0)
MCHC: 33.3 g/dL (ref 30.0–36.0)
MCV: 89.6 fL (ref 80.0–100.0)
Monocytes Absolute: 0.5 K/uL (ref 0.1–1.0)
Monocytes Relative: 8 %
Neutro Abs: 5.2 K/uL (ref 1.7–7.7)
Neutrophils Relative %: 76 %
Platelet Count: 332 K/uL (ref 150–400)
RBC: 3.38 MIL/uL — ABNORMAL LOW (ref 3.87–5.11)
RDW: 13.4 % (ref 11.5–15.5)
WBC Count: 6.7 K/uL (ref 4.0–10.5)
nRBC: 0 % (ref 0.0–0.2)

## 2024-08-07 LAB — CMP (CANCER CENTER ONLY)
ALT: 8 U/L (ref 0–44)
AST: 20 U/L (ref 15–41)
Albumin: 4.1 g/dL (ref 3.5–5.0)
Alkaline Phosphatase: 72 U/L (ref 38–126)
Anion gap: 17 — ABNORMAL HIGH (ref 5–15)
BUN: 37 mg/dL — ABNORMAL HIGH (ref 6–20)
CO2: 20 mmol/L — ABNORMAL LOW (ref 22–32)
Calcium: 9.3 mg/dL (ref 8.9–10.3)
Chloride: 105 mmol/L (ref 98–111)
Creatinine: 3.06 mg/dL — ABNORMAL HIGH (ref 0.44–1.00)
GFR, Estimated: 19 mL/min — ABNORMAL LOW
Glucose, Bld: 126 mg/dL — ABNORMAL HIGH (ref 70–99)
Potassium: 3.2 mmol/L — ABNORMAL LOW (ref 3.5–5.1)
Sodium: 142 mmol/L (ref 135–145)
Total Bilirubin: 0.4 mg/dL (ref 0.0–1.2)
Total Protein: 6.9 g/dL (ref 6.5–8.1)

## 2024-08-07 LAB — PREGNANCY, URINE: Preg Test, Ur: NEGATIVE

## 2024-08-07 MED ORDER — DARATUMUMAB-HYALURONIDASE-FIHJ 1800-30000 MG-UT/15ML ~~LOC~~ SOLN
1800.0000 mg | Freq: Once | SUBCUTANEOUS | Status: AC
  Administered 2024-08-07: 1800 mg via SUBCUTANEOUS
  Filled 2024-08-07: qty 15

## 2024-08-07 MED ORDER — BORTEZOMIB CHEMO SQ INJECTION 3.5 MG (2.5MG/ML)
1.5000 mg/m2 | Freq: Once | INTRAMUSCULAR | Status: AC
  Administered 2024-08-07: 4 mg via SUBCUTANEOUS
  Filled 2024-08-07: qty 1.6

## 2024-08-07 MED ORDER — ACETAMINOPHEN 325 MG PO TABS
650.0000 mg | ORAL_TABLET | Freq: Once | ORAL | Status: AC
  Administered 2024-08-07: 650 mg via ORAL
  Filled 2024-08-07: qty 2

## 2024-08-07 MED ORDER — SODIUM CHLORIDE 0.9 % IV SOLN: INTRAVENOUS | Status: DC

## 2024-08-07 MED ORDER — DIPHENHYDRAMINE HCL 25 MG PO CAPS
50.0000 mg | ORAL_CAPSULE | Freq: Once | ORAL | Status: AC
  Administered 2024-08-07: 50 mg via ORAL
  Filled 2024-08-07: qty 2

## 2024-08-07 MED ORDER — PALONOSETRON HCL INJECTION 0.25 MG/5ML: 0.2500 mg | Freq: Once | INTRAVENOUS | Status: DC

## 2024-08-07 MED ORDER — MONTELUKAST SODIUM 10 MG PO TABS
10.0000 mg | ORAL_TABLET | Freq: Once | ORAL | Status: AC
  Administered 2024-08-07: 10 mg via ORAL
  Filled 2024-08-07: qty 1

## 2024-08-07 MED ORDER — SODIUM CHLORIDE 0.9 % IV SOLN: Freq: Once | INTRAVENOUS | Status: AC

## 2024-08-07 NOTE — Progress Notes (Signed)
 Pt reported to infusion today for D1C1 Cytoxan /Velcade /Darzalex  Faspro. Pt's SCR 3.06 today. Per Dr. Federico hold cyclophosphamide  and proceed with Dara/Velcade . Pt to receive additional 500 cc of NS today concurrently with tx.

## 2024-08-07 NOTE — Patient Instructions (Signed)
 [facility] - A DEPT OF [facility]  Discharge Instructions: Thank you for choosing [facility] to provide your oncology and hematology care.   If you have a lab appointment with the Cancer Center, please go directly to the Cancer Center and check in at the registration area.   Wear comfortable clothing and clothing appropriate for easy access to any Portacath or PICC line.   We strive to give you quality time with your provider. You may need to reschedule your appointment if you arrive late (15 or more minutes).  Arriving late affects you and other patients whose appointments are after yours.  Also, if you miss three or more appointments without notifying the office, you may be dismissed from the clinic at the provider's discretion.      For prescription refill requests, have your pharmacy contact our office and allow 72 hours for refills to be completed.    Today you received the following chemotherapy and/or immunotherapy agents: Velcade/Darzalex Faspro      To help prevent nausea and vomiting after your treatment, we encourage you to take your nausea medication as directed.  BELOW ARE SYMPTOMS THAT SHOULD BE REPORTED IMMEDIATELY: *FEVER GREATER THAN 100.4 F (38 C) OR HIGHER *CHILLS OR SWEATING *NAUSEA AND VOMITING THAT IS NOT CONTROLLED WITH YOUR NAUSEA MEDICATION *UNUSUAL SHORTNESS OF BREATH *UNUSUAL BRUISING OR BLEEDING *URINARY PROBLEMS (pain or burning when urinating, or frequent urination) *BOWEL PROBLEMS (unusual diarrhea, constipation, pain near the anus) TENDERNESS IN MOUTH AND THROAT WITH OR WITHOUT PRESENCE OF ULCERS (sore throat, sores in mouth, or a toothache) UNUSUAL RASH, SWELLING OR PAIN  UNUSUAL VAGINAL DISCHARGE OR ITCHING   Items with * indicate a potential emergency and should be followed up as soon as possible or go to the Emergency Department if any problems should occur.  Please show the CHEMOTHERAPY ALERT CARD or  IMMUNOTHERAPY ALERT CARD at check-in to the Emergency Department and triage nurse.  Should you have questions after your visit or need to cancel or reschedule your appointment, please contact [facility] - A DEPT OF [facility]  Dept: [phone]  and follow the prompts.  Office hours are 8:00 a.m. to 4:30 p.m. Monday - Friday. Please note that voicemails left after 4:00 p.m. may not be returned until the following business day.  We are closed weekends and major holidays. You have access to a nurse at all times for urgent questions. Please call the main number to the clinic Dept: [phone] and follow the prompts.   For any non-urgent questions, you may also contact your provider using MyChart. We now offer e-Visits for anyone 25 and older to request care online for non-urgent symptoms. For details visit [email or web address].   Also download the MyChart app! Go to the app store, search "MyChart", open the app, select [place], and log in with your MyChart username and password.

## 2024-08-07 NOTE — Progress Notes (Signed)
 Pt informed this RN that she took PO dex at 12:00 today prior to her appts today.

## 2024-08-07 NOTE — Telephone Encounter (Signed)
 Spoke with pt in regards to her FMLA form being completed, faxed,and confirmation received. Emailed the pt her copy for her records.

## 2024-08-11 NOTE — Progress Notes (Unsigned)
 "     [facility]   [phone] [phone]    Patient Care Team: Gerome Brunet, DO as PCP - General (Family Medicine) Pickenpack-Cousar, Fannie SAILOR, NP as Nurse Practitioner (Hospice and Palliative Medicine)   CHIEF COMPLAINT: Follow up Multiple Myeloma   CURRENT THERAPY: Dara CyBorD and Zometa   INTERVAL HISTORY [name] returns for follow-up and treatment as scheduled.  She began cycle 1 day 1 on 08/07/24, omitted Cytoxan  due to renal dysfunction.  She tolerated treatment well overall with no immediate side effects.  She is eating and drinking, no nausea/vomiting.  Bowels are sluggish but managing with regimen including stool softener and will start MiraLAX.  Current pain level is a 0 when sitting still but worsens with movement.  Will start OxyContin  once approved by insurance.  Legs are little swollen while she is off Lasix .  Denies fever, chills, cough, chest pain, dyspnea, rash, neuropathy.  ROS  All other systems reviewed and negative  Past Medical History:  Diagnosis Date   Anemia    history only - no current problem   CHF (congestive heart failure) (HCC)    ECHO cardiogram schedule 07/11/11   Diabetes mellitus    no med - last A1C was 5.9  on 04/2011   Hyperlipidemia    Hypertension    Sleep apnea    does not use CPAP   UTI (lower urinary tract infection)    Vitamin D  deficiency      Past Surgical History:  Procedure Laterality Date   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     Outpatient Encounter Medications as of 08/13/2024  Medication Sig Note   potassium chloride  SA (KLOR-CON  M) 20 MEQ tablet Take 1 tablet (20 mEq total) by mouth 2 (two) times daily.    acetaminophen  (TYLENOL )  325 MG tablet Take 2 tablets (650 mg total) by mouth every 6 (six) hours as needed.    allopurinol  (ZYLOPRIM ) 300 MG tablet Take 1 tablet (300 mg total) by mouth daily.    carvedilol  (COREG ) 12.5 MG tablet Take 12.5 mg by mouth 2 (two) times daily with a meal.    dexamethasone  (DECADRON ) 4 MG tablet Take 10 tablets (40 mg total) by mouth once a week.    esomeprazole (NEXIUM) 20 MG capsule Take 20 mg by mouth daily before breakfast.    famotidine  (PEPCID ) 20 MG tablet Take 20 mg by mouth daily after breakfast.    feeding supplement (ENSURE PLUS HIGH PROTEIN) LIQD Take 237 mLs by mouth 2 (two) times daily between meals.    [Paused] furosemide  (LASIX ) 40 MG tablet Take 40 mg by mouth daily after breakfast. (Patient not taking: Reported on 08/13/2024)    gabapentin  (NEURONTIN ) 300 MG capsule Take 1 capsule (300 mg total) by mouth 3 (three) times daily.    lisdexamfetamine  (VYVANSE ) 40 MG capsule Take 40 mg by mouth every morning.    losartan -hydrochlorothiazide  (HYZAAR) 100-12.5 MG tablet Take 1 tablet by mouth daily.    methocarbamol  (ROBAXIN ) 500 MG tablet Take 1 tablet (500 mg total) by mouth every 8 (eight) hours as needed for up to 90 doses for muscle spasms.    ondansetron  (ZOFRAN ) 8 MG tablet Take 1 tablet (8  mg total) by mouth every 8 (eight) hours as needed.    oxyCODONE  (OXY IR/ROXICODONE ) 5 MG immediate release tablet Take 1-2 tablets (5-10 mg total) by mouth every 4 (four) hours as needed for severe pain (pain score 7-10).    oxyCODONE  (OXYCONTIN ) 10 mg 12 hr tablet Take 1 tablet (10 mg total) by mouth every 12 (twelve) hours.    prochlorperazine  (COMPAZINE ) 10 MG tablet Take 1 tablet (10 mg total) by mouth every 6 (six) hours as needed for nausea or vomiting.    senna-docusate (SENNA S) 8.6-50 MG tablet Take 2 tablets by mouth at bedtime.    tirzepatide (MOUNJARO) 7.5 MG/0.5ML Pen Inject 7.5 mg into the skin once a week. 07/11/2024: Thursdays    valACYclovir (VALTREX) 500 MG tablet Take 500  mg by mouth daily.    [DISCONTINUED] gabapentin  (NEURONTIN ) 300 MG capsule Take 1 capsule (300 mg total) by mouth 3 (three) times daily.    [DISCONTINUED] methocarbamol  (ROBAXIN ) 500 MG tablet Take 1 tablet (500 mg total) by mouth every 8 (eight) hours as needed for up to 90 doses for muscle spasms.    [DISCONTINUED] oxyCODONE  (OXY IR/ROXICODONE ) 5 MG immediate release tablet Take 1-2 tablets (5-10 mg total) by mouth every 4 (four) hours as needed for severe pain (pain score 7-10).    Facility-Administered Encounter Medications as of 08/13/2024  Medication   [DISCONTINUED] potassium chloride  SA (KLOR-CON  M) CR tablet 40 mEq     There were no vitals filed for this visit. There is no height or weight on file to calculate BMI.   ECOG PERFORMANCE STATUS: 1 - Symptomatic but completely ambulatory  PHYSICAL EXAM GENERAL:alert, no distress and comfortable SKIN: no rash  EYES: sclera clear NECK: without mass LYMPH:  no palpable cervical or supraclavicular lymphadenopathy  LUNGS: clear with normal breathing effort HEART: regular rate & rhythm, no lower extremity edema ABDOMEN: abdomen soft, non-tender and normal bowel sounds NEURO: alert & oriented x 3 with fluent speech, no focal motor/sensory deficits Breast exam:  PAC without erythema    CBC    Latest Ref Rng & Units 08/13/2024    8:12 AM 08/07/2024   12:48 PM 07/29/2024    1:21 PM  CBC  WBC 4.0 - 10.5 K/uL 8.5  6.7  6.7   Hemoglobin 12.0 - 15.0 g/dL 89.6  89.8  89.9   Hematocrit 36.0 - 46.0 % 31.0  30.3  30.2   Platelets 150 - 400 K/uL 315  332  392       CMP     Latest Ref Rng & Units 08/13/2024    8:12 AM 08/07/2024   12:48 PM 07/29/2024    1:21 PM  CMP  Glucose 70 - 99 mg/dL 865  873  84   BUN 6 - 20 mg/dL 40  37  32   Creatinine 0.44 - 1.00 mg/dL 7.43  6.93  7.60   Sodium 135 - 145 mmol/L 141  142  141   Potassium 3.5 - 5.1 mmol/L 3.0  3.2  4.1   Chloride 98 - 111 mmol/L 103  105  104   CO2 22 - 32 mmol/L 21  20  17     Calcium 8.9 - 10.3 mg/dL 9.3  9.3  8.1   Total Protein 6.5 - 8.1 g/dL 6.6  6.9  6.8   Total Bilirubin 0.0 - 1.2 mg/dL 0.3  0.4  0.4   Alkaline Phos 38 - 126 U/L 75  72  74   AST  15 - 41 U/L 16  20  24    ALT 0 - 44 U/L 7  8  5        ASSESSMENT & PLAN:Kathryn [name] Olives Millar 45 y.o. female    #Free Kappa Multiple Myeloma  --07/13/2024: IR guided bone marrow biopsy showed Kappa restricted plasma cell myeloma involving approximately 90% of a hypercellular (90%) with significantly decreased myeloid and erythroid precursors; most consistent with multiple myeloma - Dr. Federico recommend quad therapy Dara CyBorD due to kidney dysfunction and high disease burden.  -Multiple osseous lesions noted on imaging, has been referred to IR for consideration of kyphoplasty.  Continue pain management per palliative care which is currently effective - managing constipation -On allopurinol  for TLS prophylaxis -Received Zometa  07/14/2024 for hypercalcemia, today's calcium 9.3 -Began treatment 08/07/2024, Cytoxan  held for C1D1 for Scr >3 - Ms. Perfect appears stable, tolerated cycle 1 day 1 Velcade /Dara/Dex well overall.  Pain is controlled, performance status remains adequate    PLAN: - Labs reviewed, SCr improved to 2.56, K 3.0; will f/up pending UPEP - Proceed with C1 D8 CyBorD-Dara (took Dex prior to arrival to infusion) - KCl 40 mEq po x 1 in clinic, then start 20 mEq twice daily at home - Continue TLS and viral prophylaxis meds and supportive care at home - Return for C1 D15 CyBorD-Dara in 1 week, then follow-up in 2 weeks with ongoing treatment -Plan reviewed with Dr. Federico, pharmacy, and nursing    All questions were answered. The patient knows to call the clinic with any problems, questions or concerns. No barriers to learning were detected.  Latrece Nitta K Glorious Flicker, NP 08/13/2024  "

## 2024-08-12 ENCOUNTER — Encounter: Payer: Self-pay | Admitting: Nurse Practitioner

## 2024-08-12 ENCOUNTER — Telehealth: Payer: Self-pay

## 2024-08-12 ENCOUNTER — Inpatient Hospital Stay: Admitting: Nurse Practitioner

## 2024-08-12 ENCOUNTER — Encounter: Payer: Self-pay | Admitting: Hematology and Oncology

## 2024-08-12 ENCOUNTER — Other Ambulatory Visit (HOSPITAL_COMMUNITY): Payer: Self-pay

## 2024-08-12 VITALS — BP 157/98 | HR 112 | Temp 97.3°F | Resp 18 | Wt 314.5 lb

## 2024-08-12 DIAGNOSIS — M8458XS Pathological fracture in neoplastic disease, other specified site, sequela: Secondary | ICD-10-CM | POA: Diagnosis not present

## 2024-08-12 DIAGNOSIS — K5903 Drug induced constipation: Secondary | ICD-10-CM

## 2024-08-12 DIAGNOSIS — Z515 Encounter for palliative care: Secondary | ICD-10-CM

## 2024-08-12 DIAGNOSIS — G893 Neoplasm related pain (acute) (chronic): Secondary | ICD-10-CM | POA: Diagnosis not present

## 2024-08-12 DIAGNOSIS — M792 Neuralgia and neuritis, unspecified: Secondary | ICD-10-CM

## 2024-08-12 DIAGNOSIS — C9000 Multiple myeloma not having achieved remission: Secondary | ICD-10-CM

## 2024-08-12 DIAGNOSIS — R530 Neoplastic (malignant) related fatigue: Secondary | ICD-10-CM

## 2024-08-12 DIAGNOSIS — Z87891 Personal history of nicotine dependence: Secondary | ICD-10-CM

## 2024-08-12 DIAGNOSIS — T402X5S Adverse effect of other opioids, sequela: Secondary | ICD-10-CM | POA: Diagnosis not present

## 2024-08-12 DIAGNOSIS — M62838 Other muscle spasm: Secondary | ICD-10-CM

## 2024-08-12 MED ORDER — METHOCARBAMOL 500 MG PO TABS
500.0000 mg | ORAL_TABLET | Freq: Three times a day (TID) | ORAL | 0 refills | Status: AC | PRN
  Filled 2024-08-18: qty 90, 30d supply, fill #0

## 2024-08-12 MED ORDER — GABAPENTIN 300 MG PO CAPS
300.0000 mg | ORAL_CAPSULE | Freq: Three times a day (TID) | ORAL | 0 refills | Status: AC
  Filled 2024-08-18: qty 90, 30d supply, fill #0

## 2024-08-12 MED ORDER — OXYCODONE HCL 5 MG PO TABS: 5.0000 mg | ORAL_TABLET | ORAL | 0 refills | Status: AC | PRN

## 2024-08-12 MED ORDER — OXYCODONE HCL ER 10 MG PO T12A: 10.0000 mg | EXTENDED_RELEASE_TABLET | Freq: Two times a day (BID) | ORAL | 0 refills | Status: AC

## 2024-08-12 NOTE — Progress Notes (Addendum)
 "    Palliative Medicine [facility]  [phone] [phone]   Name: Kathryn [name] Date: 08/12/2024 MRN: [id number]  DOB: [birth year]  Patient Care Team: Gerome Brunet, DO as PCP - General (Family Medicine)    REASON FOR CONSULTATION: Kathryn [name] is a 44 y.o. female with oncologic medical history of recently diagnosed multiple myeloma (07/11/24) after presenting to ED with back pain found to have lytic lesions.  Palliative is ask to follow patient for symptom management.     SOCIAL HISTORY:     reports that she quit smoking about 23 years ago. Her smoking use included cigarettes. She started smoking about 24 years ago. She has a 0.3 pack-year smoking history. She has never used smokeless tobacco. She reports current alcohol use. She reports that she does not use drugs.  ADVANCE DIRECTIVES:  None on file. Patient reports her friend Kathryn [name] would be her medical decision maker.   CODE STATUS: Full code  PAST MEDICAL HISTORY: Past Medical History:  Diagnosis Date   Anemia    history only - no current problem   CHF (congestive heart failure) (HCC)    ECHO cardiogram schedule 07/11/11   Diabetes mellitus    no med - last A1C was 5.9  on 04/2011   Hyperlipidemia    Hypertension    Sleep apnea    does not use CPAP   UTI (lower urinary tract infection)    Vitamin D  deficiency     PAST SURGICAL HISTORY:  Past Surgical History:  Procedure Laterality Date   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    HEMATOLOGY/ONCOLOGY HISTORY:  Oncology History  Multiple myeloma (HCC)  07/22/2024 Initial Diagnosis   Multiple myeloma (HCC)   08/07/2024 -  Chemotherapy    Patient is on Treatment Plan : MYELOMA DaraCyBorD (Daratumumab  SQ + Cyclophosphamide  IV + Bortezomib  SQ + Dexamethasone  PO) q28d x 8 cycles / Daratumumab  SQ q28d       ALLERGIES:  is allergic to pravastatin, floxin [ofloxacin], lisinopril , and sulfa antibiotics.  MEDICATIONS:  Current Outpatient Medications  Medication Sig Dispense Refill   oxyCODONE  (OXYCONTIN ) 10 mg 12 hr tablet Take 1 tablet (10 mg total) by mouth every 12 (twelve) hours. 14 tablet 0   acetaminophen  (TYLENOL ) 325 MG tablet Take 2 tablets (650 mg total) by mouth every 6 (six) hours as needed.     allopurinol  (ZYLOPRIM ) 300 MG tablet Take 1 tablet (300 mg total) by mouth daily. 90 tablet 0   carvedilol  (COREG ) 12.5 MG tablet Take 12.5 mg by mouth 2 (two) times daily with a meal.     dexamethasone  (DECADRON ) 4 MG tablet Take 10 tablets (40 mg total) by mouth once a week. 40 tablet 5   esomeprazole (NEXIUM) 20 MG capsule Take 20 mg by mouth daily before breakfast.     famotidine  (PEPCID ) 20 MG tablet Take 20 mg by mouth daily after breakfast.     feeding supplement (ENSURE PLUS HIGH PROTEIN) LIQD Take 237 mLs by mouth 2 (two) times daily between meals.     [Paused] furosemide  (LASIX ) 40 MG tablet Take 40 mg by mouth daily after breakfast.     gabapentin  (NEURONTIN ) 300 MG  capsule Take 1 capsule (300 mg total) by mouth 3 (three) times daily. 90 capsule 0   lisdexamfetamine  (VYVANSE ) 40 MG capsule Take 40 mg by mouth every morning.     losartan -hydrochlorothiazide  (HYZAAR) 100-12.5 MG tablet Take 1 tablet by mouth daily.     methocarbamol  (ROBAXIN ) 500 MG tablet Take 1 tablet (500 mg total) by mouth every 8 (eight) hours as needed for up to 90 doses for muscle spasms. 90 tablet 0   ondansetron  (ZOFRAN ) 8 MG tablet Take 1 tablet (8 mg total) by mouth every 8 (eight) hours as needed. 30 tablet 0   oxyCODONE  (OXY IR/ROXICODONE ) 5 MG immediate release tablet Take 1-2 tablets (5-10 mg total) by mouth every 4 (four) hours as needed for  severe pain (pain score 7-10). 90 tablet 0   prochlorperazine  (COMPAZINE ) 10 MG tablet Take 1 tablet (10 mg total) by mouth every 6 (six) hours as needed for nausea or vomiting. 30 tablet 0   senna-docusate (SENNA S) 8.6-50 MG tablet Take 2 tablets by mouth at bedtime. 100 tablet 3   tirzepatide (MOUNJARO) 7.5 MG/0.5ML Pen Inject 7.5 mg into the skin once a week.     valACYclovir (VALTREX) 500 MG tablet Take 500 mg by mouth daily.     No current facility-administered medications for this visit.    VITAL SIGNS: BP (!) 157/98 (BP Location: Right Wrist, Patient Position: Sitting)   Pulse (!) 112   Temp (!) 97.3 F (36.3 C) (Temporal)   Resp 18   Wt (!) 314 lb 8 oz (142.7 kg)   SpO2 99%   BMI 50.76 kg/m  Filed Weights   08/12/24 1313  Weight: (!) 314 lb 8 oz (142.7 kg)    Estimated body mass index is 50.76 kg/m as calculated from the following:   Height as of 08/07/24: 5' 6 (1.676 m).   Weight as of this encounter: 314 lb 8 oz (142.7 kg).  LABS: CBC:    Component Value Date/Time   WBC 6.7 08/07/2024 1248   WBC 7.4 07/14/2024 0521   HGB 10.1 (L) 08/07/2024 1248   HCT 30.3 (L) 08/07/2024 1248   PLT 332 08/07/2024 1248   MCV 89.6 08/07/2024 1248   NEUTROABS 5.2 08/07/2024 1248   LYMPHSABS 0.8 08/07/2024 1248   MONOABS 0.5 08/07/2024 1248   EOSABS 0.2 08/07/2024 1248   BASOSABS 0.0 08/07/2024 1248   Comprehensive Metabolic Panel:    Component Value Date/Time   NA 142 08/07/2024 1248   K 3.2 (L) 08/07/2024 1248   CL 105 08/07/2024 1248   CO2 20 (L) 08/07/2024 1248   BUN 37 (H) 08/07/2024 1248   CREATININE 3.06 (H) 08/07/2024 1248   GLUCOSE 126 (H) 08/07/2024 1248   CALCIUM 9.3 08/07/2024 1248   CALCIUM 12.2 (H) 07/12/2024 1059   AST 20 08/07/2024 1248   ALT 8 08/07/2024 1248   ALKPHOS 72 08/07/2024 1248   BILITOT 0.4 08/07/2024 1248   PROT 6.9 08/07/2024 1248   ALBUMIN 4.1 08/07/2024 1248    RADIOGRAPHIC STUDIES: No results found.  PERFORMANCE STATUS (ECOG) :  1 - Symptomatic but completely ambulatory  Review of Systems  Constitutional:  Positive for activity change and fatigue.  Musculoskeletal:  Positive for arthralgias and back pain.  Unless otherwise noted, a complete review of systems is negative.  Physical Exam General: NAD Cardiovascular: regular rate and rhythm Pulmonary: clear ant fields Abdomen: soft, nontender, + bowel sounds Extremities: no edema, no joint deformities Skin: no rashes Neurological: Alert and  oriented x3  Discussed the use of AI scribe software for clinical note transcription with the patient, who gave verbal consent to proceed.  History of Present Illness Kathryn [name] is a 44 year old female recently diagnosed with multiple myeloma and lytic bone lesions who presents to the clinic for her initial palliative visit for pain management. No family present.  Patient is alert and able to engage appropriately in discussions.  No acute distress noted.  Ambulatory without assistive devices.  I introduced myself, Kathryn [name], and Palliative's role in collaboration with the oncology team. Concept of Palliative Care was introduced as specialized medical care for people and their families living with serious illness.  It focuses on providing relief from the symptoms and stress of a serious illness.  The goal is to improve quality of life for both the patient and the family. Values and goals of care important to patient and family were attempted to be elicited.  Kathryn [name] currently lives alone in an extended stay after financial constraints due to a sudden divorce.  No biological children. 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 She has a history of significant personal loss, including the death of her mother in 09/03/2019 due to complications from COVID and an unforseen divorce.   At home patient is able to perform all ADLs independently with some limitations due to fatigue and pain. Her  appetite has improved although she is currently taking Mounjaro for weight management. She has a history of weight loss surgery in 09/02/2010.   She experiences constipation due to pain medication, managed with Senakot-S 2 tablets at bedtime. Reports intermittent episodes of constipation requiring enema use and disimpaction. Education provided on bowel regimen in the setting of opioid use. Patient instructed to begin Miralax twice daily along with Senna-S two tablets daily.   Kathryn [name] has been experiencing significant back pain since December 2025, which began with a 'pop' and 'crackle' sound while standing at the sink, followed by severe pain. Initially, she managed the pain with increasing doses of ibuprofen , but the pain persisted and worsened, leading to an ER visit after Christmas 2025. During the ER visit, she was diagnosed with an L1 compression fracture and multiple lytic lesions. Imaging revealed additional fractures in the T11 vertebra and multiple ribs.  She was admitted to the hospital until New Year's Eve 2025, where she was also diagnosed with multiple myeloma. She is currently on a pain management regimen including oxycodone  5-10 mg every four hours, gabapentin  300 mg every eight hours, Robaxin  500 mg every eight hours, and Tylenol  Extra Strength. The pain is manageable as long as she adheres to her medication schedule, with pain levels reducing to about a 2-3 out of 10 after medication. Patient states if she goes over 4 hour period pain is severe.   Complete medication, chart, and social history review completed. Patient denies any alcohol or illicit drug use. Extensive discussions and explanation of palliative's role in collaboration with his oncology team to assist in patient's pain and symptom management. Education provided on criteria for ongoing pain support via palliative team. Pain contract presented with extensive education to allow for safe management. Patient verbalized understanding and  willingly signed pain contract. Copy provided.    Education provided on the goal of pain management with realistic understanding we may not be able to make her pain go away or reach level of zero however if we are able to achieve a manageable pain level  allowing her the ability to have improvement in quality of life this our win. She verbalized understanding and appreciation.   Extensive education provided on the use of Oxycodone  ER every 12 hours. I discussed administration, efficacy, and potential side effects. Patient verbalized understanding.   She has started treatment for multiple myeloma, including   Goals of Care  We discussed Kathryn [name]'s current illness and what it means in the larger context of her on-going co-morbidities. Natural disease trajectory and expectations were discussed.  Patient is realistic in her understanding of cancer diagnosis and plan of care. She tolerated initial treatment. She is remaining hopeful for continued improvement. Goals are clear to treat the treatable allow her every opportunity to continue to thrive.  Her quality of life is most important.  I empathetically approached discussions regarding advanced directives. Patient does not have documents in place. She states she has limited to no family. Her close friend Kathryn [name] would be her emergency contact and decision maker in the event she was unable to speak for herself.   I discussed the importance of continued conversation with family and their medical providers regarding overall plan of care and treatment options, ensuring decisions are within the context of the patients values and GOCs. Assessment & Plan Established therapeutic relationship. Education provided on palliative's role in collaboration with their Oncology/Radiation team.  Multiple myeloma, newly diagnose  Active multiple myeloma with recent diagnosis following L1 compression fracture and multiple lytic lesions. Initial treatment with Velcade   and Darzalex  completed last week. Cytoxan  not administered due to renal function concerns during initial treatment. Awaiting lab results to determine if Cytoxan  can be administered tomorrow as determined and managed by Oncology. Experiencing significant pain and fatigue. - Continue Velcade  and Darzalex  as per oncology plan. - Will check labs tomorrow to assess renal function for potential Cytoxan  administration per Oncology guidelines and management.  - Monitor for adverse reactions to chemotherapy.  Neoplasm related pain and pathological fractures Severe pain secondary to multiple myeloma with L1 compression fracture and multiple rib fractures. Pain managed with oxycodone , gabapentin , Robaxin , and Tylenol . Pain levels have decreased from 10 to 5-6 with current regimen. Extended release oxycodone  considered to maintain consistent pain control. Discussed potential side effects of extended release oxycodone , including drowsiness and constipation. Alternative of morphine  extended release if oxycodone  is not covered by insurance. - Prescribed OxyContin  extended release 10 mg every 12 hours. - Continue current pain regimen with oxycodone  5mg  every 4 hours as needed, gabapentin  300mg  three times, Robaxin  500mg  three times daily as needed, and Tylenol . - Monitor for side effects of extended release oxycodone , including drowsiness and constipation. - Educated on taking extended release oxycodone  separately from short-acting oxycodone  initially. - Will consider morphine  extended release if oxycodone  is not covered by insurance.  Neoplastic malignant related fatigue Fatigue associated with active multiple myeloma and recent chemotherapy. Fatigue levels fluctuate with pain management and treatment schedule.  Constipation due to opioid therapy Constipation secondary to opioid use for pain management. Currently managed with Senokot S and Miralax. Reports significant difficulty with bowel movements, requiring  manual disimpaction at times. - Continue Senokot S, 2 tablets at bedtime. Increase to twice daily.  - Add Miralax, 1 capful twice daily. - Educated on the importance of consistent bowel regimen to prevent constipation. - Advised against manual disimpaction due to risk of infection and tears, especially during chemotherapy.  Goals of Care Patient is realistic in her understanding of cancer diagnosis and plan of care. Her goal is to continue  to treat the treatable aggressively allowing her every opportunity to thrive.  -No advanced directive on file -Patient reports in case of emergency or unable to speak for herself her friend Kathryn [name] would be her medical decision maker.   Follow-Up I will plan to see patient back in a week for close follow-up.   Patient expressed understanding and was in agreement with this plan. She also understands that She can call the clinic at any time with any questions, concerns, or complaints.   Thank you for your referral and allowing Palliative to assist in Kathryn [name]'s care.   Number and complexity of problems addressed: HIGH - 1 or more chronic illnesses with SEVERE exacerbation, progression, or side effects of treatment - advanced cancer, pain. Any controlled substances utilized were prescribed in the context of palliative care.  I personally spent a total of 70 minutes in the care of the patient today including preparing to see the patient, getting/reviewing separately obtained history, performing a medically appropriate exam/evaluation, counseling and educating, placing orders, documenting clinical information in the EHR, and coordinating care. Visit consisted of counseling and education dealing with the complex and emotionally intense issues of symptom management and palliative care in the setting of serious and potentially life-threatening illness.  Signed by: Levon Borer, AGPCNP-BC Palliative Medicine Team/[facility]      "

## 2024-08-12 NOTE — Telephone Encounter (Signed)
 Oral Oncology Patient Advocate Encounter   Received notification that prior authorization for Oxycodone  5mg  & Oxycontin  ER 10mg  is required.   PA submitted on 08/12/24 Vincenzo LACE, [place] Status is pending     Lucie Lamer, CPhT [facility]  [facility] Oncology Pharmacy Patient Advocate Specialist [facility] [place] Phone: [phone]  Fax: [phone] [email or web address]

## 2024-08-13 ENCOUNTER — Inpatient Hospital Stay

## 2024-08-13 ENCOUNTER — Other Ambulatory Visit: Payer: Self-pay | Admitting: *Deleted

## 2024-08-13 ENCOUNTER — Other Ambulatory Visit (HOSPITAL_COMMUNITY): Payer: Self-pay

## 2024-08-13 ENCOUNTER — Encounter: Payer: Self-pay | Admitting: Nurse Practitioner

## 2024-08-13 ENCOUNTER — Inpatient Hospital Stay: Admitting: Nurse Practitioner

## 2024-08-13 VITALS — BP 132/78 | HR 119 | Temp 98.6°F | Resp 18 | Ht 66.0 in | Wt 314.2 lb

## 2024-08-13 DIAGNOSIS — E876 Hypokalemia: Secondary | ICD-10-CM | POA: Diagnosis not present

## 2024-08-13 DIAGNOSIS — C9000 Multiple myeloma not having achieved remission: Secondary | ICD-10-CM

## 2024-08-13 DIAGNOSIS — M898X9 Other specified disorders of bone, unspecified site: Secondary | ICD-10-CM

## 2024-08-13 LAB — CBC WITH DIFFERENTIAL (CANCER CENTER ONLY)
Abs Immature Granulocytes: 0.02 10*3/uL (ref 0.00–0.07)
Basophils Absolute: 0 10*3/uL (ref 0.0–0.1)
Basophils Relative: 0 %
Eosinophils Absolute: 0.3 10*3/uL (ref 0.0–0.5)
Eosinophils Relative: 4 %
HCT: 31 % — ABNORMAL LOW (ref 36.0–46.0)
Hemoglobin: 10.3 g/dL — ABNORMAL LOW (ref 12.0–15.0)
Immature Granulocytes: 0 %
Lymphocytes Relative: 12 %
Lymphs Abs: 1 10*3/uL (ref 0.7–4.0)
MCH: 29.9 pg (ref 26.0–34.0)
MCHC: 33.2 g/dL (ref 30.0–36.0)
MCV: 89.9 fL (ref 80.0–100.0)
Monocytes Absolute: 0.7 10*3/uL (ref 0.1–1.0)
Monocytes Relative: 9 %
Neutro Abs: 6.3 10*3/uL (ref 1.7–7.7)
Neutrophils Relative %: 75 %
Platelet Count: 315 10*3/uL (ref 150–400)
RBC: 3.45 MIL/uL — ABNORMAL LOW (ref 3.87–5.11)
RDW: 13.7 % (ref 11.5–15.5)
WBC Count: 8.5 10*3/uL (ref 4.0–10.5)
nRBC: 0 % (ref 0.0–0.2)

## 2024-08-13 LAB — CMP (CANCER CENTER ONLY)
ALT: 7 U/L (ref 0–44)
AST: 16 U/L (ref 15–41)
Albumin: 4.1 g/dL (ref 3.5–5.0)
Alkaline Phosphatase: 75 U/L (ref 38–126)
Anion gap: 17 — ABNORMAL HIGH (ref 5–15)
BUN: 40 mg/dL — ABNORMAL HIGH (ref 6–20)
CO2: 21 mmol/L — ABNORMAL LOW (ref 22–32)
Calcium: 9.3 mg/dL (ref 8.9–10.3)
Chloride: 103 mmol/L (ref 98–111)
Creatinine: 2.56 mg/dL — ABNORMAL HIGH (ref 0.44–1.00)
GFR, Estimated: 23 mL/min — ABNORMAL LOW
Glucose, Bld: 134 mg/dL — ABNORMAL HIGH (ref 70–99)
Potassium: 3 mmol/L — ABNORMAL LOW (ref 3.5–5.1)
Sodium: 141 mmol/L (ref 135–145)
Total Bilirubin: 0.3 mg/dL (ref 0.0–1.2)
Total Protein: 6.6 g/dL (ref 6.5–8.1)

## 2024-08-13 MED ORDER — PALONOSETRON HCL INJECTION 0.25 MG/5ML
0.2500 mg | Freq: Once | INTRAVENOUS | Status: AC
  Administered 2024-08-13: 0.25 mg via INTRAVENOUS
  Filled 2024-08-13: qty 5

## 2024-08-13 MED ORDER — SODIUM CHLORIDE 0.9 % IV SOLN
300.0000 mg/m2 | Freq: Once | INTRAVENOUS | Status: AC
  Administered 2024-08-13: 780 mg via INTRAVENOUS
  Filled 2024-08-13: qty 39

## 2024-08-13 MED ORDER — MONTELUKAST SODIUM 10 MG PO TABS
10.0000 mg | ORAL_TABLET | Freq: Once | ORAL | Status: AC
  Administered 2024-08-13: 10 mg via ORAL
  Filled 2024-08-13: qty 1

## 2024-08-13 MED ORDER — POTASSIUM CHLORIDE CRYS ER 20 MEQ PO TBCR
20.0000 meq | EXTENDED_RELEASE_TABLET | Freq: Two times a day (BID) | ORAL | 0 refills | Status: AC
  Filled 2024-08-18: qty 60, 30d supply, fill #0

## 2024-08-13 MED ORDER — DARATUMUMAB-HYALURONIDASE-FIHJ 1800-30000 MG-UT/15ML ~~LOC~~ SOLN
1800.0000 mg | Freq: Once | SUBCUTANEOUS | Status: AC
  Administered 2024-08-13: 1800 mg via SUBCUTANEOUS
  Filled 2024-08-13: qty 15

## 2024-08-13 MED ORDER — SODIUM CHLORIDE 0.9 % IV SOLN: Freq: Once | INTRAVENOUS | Status: AC

## 2024-08-13 MED ORDER — POTASSIUM CHLORIDE CRYS ER 20 MEQ PO TBCR: 40.0000 meq | EXTENDED_RELEASE_TABLET | Freq: Once | ORAL | Status: DC

## 2024-08-13 MED ORDER — ACETAMINOPHEN 325 MG PO TABS
650.0000 mg | ORAL_TABLET | Freq: Once | ORAL | Status: AC
  Administered 2024-08-13: 650 mg via ORAL
  Filled 2024-08-13: qty 2

## 2024-08-13 MED ORDER — BORTEZOMIB CHEMO SQ INJECTION 3.5 MG (2.5MG/ML)
1.5000 mg/m2 | Freq: Once | INTRAMUSCULAR | Status: AC
  Administered 2024-08-13: 4 mg via SUBCUTANEOUS
  Filled 2024-08-13: qty 1.6

## 2024-08-13 MED ORDER — DIPHENHYDRAMINE HCL 25 MG PO CAPS
50.0000 mg | ORAL_CAPSULE | Freq: Once | ORAL | Status: AC
  Administered 2024-08-13: 50 mg via ORAL
  Filled 2024-08-13: qty 2

## 2024-08-13 MED ORDER — POTASSIUM CHLORIDE CRYS ER 20 MEQ PO TBCR
40.0000 meq | EXTENDED_RELEASE_TABLET | Freq: Once | ORAL | Status: AC
  Administered 2024-08-13: 40 meq via ORAL
  Filled 2024-08-13: qty 2

## 2024-08-13 NOTE — Progress Notes (Signed)
 Pt observed for 1 hour post Darazlex faspro. Tolerated well

## 2024-08-13 NOTE — Telephone Encounter (Signed)
 Oral Oncology Patient Advocate Encounter  Prior Authorization for Oxycodone  5mg  & Oxycontin  ER 10mg   have been approved.    PA# [id number] & [id number] Effective dates: 08/13/24 through 07/14/2221  Patients co-pay is $22.66 & $104.XX.    Lucie Lamer, CPhT [place]  [facility] Special[facility] Pharm[facility] Services Oncology Pharmacy Patient Advocate Specialist II [facility] [place] Phone: [phone]  Fax: [phone] [email or web address]

## 2024-08-13 NOTE — Progress Notes (Addendum)
 Ok to initiate CTX today per Dr. Federico.  Calani Gick, PharmD, MBA

## 2024-08-13 NOTE — Patient Instructions (Signed)
 [facility] - A DEPT OF [place]  Discharge Instructions: Thank you for choosing [facility] to provide your oncology and hematology care.   If you have a lab appointment with the Cancer Center, please go directly to the Cancer Center and check in at the registration area.   Wear comfortable clothing and clothing appropriate for easy access to any Portacath or PICC line.   We strive to give you quality time with your provider. You may need to reschedule your appointment if you arrive late (15 or more minutes).  Arriving late affects you and other patients whose appointments are after yours.  Also, if you miss three or more appointments without notifying the office, you may be dismissed from the clinic at the providers discretion.      For prescription refill requests, have your pharmacy contact our office and allow 72 hours for refills to be completed.    Today you received the following chemotherapy and/or immunotherapy agents: Cytoxan  and  Velcade /Darzalex  Faspro      To help prevent nausea and vomiting after your treatment, we encourage you to take your nausea medication as directed.  BELOW ARE SYMPTOMS THAT SHOULD BE REPORTED IMMEDIATELY: *FEVER GREATER THAN 100.4 F (38 C) OR HIGHER *CHILLS OR SWEATING *NAUSEA AND VOMITING THAT IS NOT CONTROLLED WITH YOUR NAUSEA MEDICATION *UNUSUAL SHORTNESS OF BREATH *UNUSUAL BRUISING OR BLEEDING *URINARY PROBLEMS (pain or burning when urinating, or frequent urination) *BOWEL PROBLEMS (unusual diarrhea, constipation, pain near the anus) TENDERNESS IN MOUTH AND THROAT WITH OR WITHOUT PRESENCE OF ULCERS (sore throat, sores in mouth, or a toothache) UNUSUAL RASH, SWELLING OR PAIN  UNUSUAL VAGINAL DISCHARGE OR ITCHING   Items with * indicate a potential emergency and should be followed up as soon as possible or go to the Emergency Department if any problems should occur.  Please show the CHEMOTHERAPY  ALERT CARD or IMMUNOTHERAPY ALERT CARD at check-in to the Emergency Department and triage nurse.  Should you have questions after your visit or need to cancel or reschedule your appointment, please contact [facility]  Dept: [phone]  and follow the prompts.  Office hours are 8:00 a.m. to 4:30 p.m. Monday - Friday. Please note that voicemails left after 4:00 p.m. may not be returned until the following business day.  We are closed weekends and major holidays. You have access to a nurse at all times for urgent questions. Please call the main number to the clinic Dept: [phone] and follow the prompts.   For any non-urgent questions, you may also contact your provider using MyChart. We now offer e-Visits for anyone 32 and older to request care online for non-urgent symptoms. For details visit [email or web address].   Also download the MyChart app! Go to the app store, search MyChart, open the app, select , and log in with your MyChart username and password.   Cyclophosphamide  Injection What is this medication? CYCLOPHOSPHAMIDE  (sye kloe FOSS fa mide) treats some types of cancer. It works by slowing down the growth of cancer cells. This medicine may be used for other purposes; ask your health care provider or pharmacist if you have questions. COMMON BRAND NAME(S): Cyclophosphamide , Cytoxan , Neosar  What should I tell my care team before I take this medication? They need to know if you have any of these conditions: Heart disease Irregular heartbeat or rhythm Infection Kidney problems Liver disease Low  blood cell levels (white cells, platelets, or red blood cells) Lung disease Previous radiation Trouble passing urine An unusual or allergic reaction to cyclophosphamide , other medications, foods, dyes, or preservatives Pregnant or trying to get pregnant Breast-feeding How should I use this medication? This medication  is injected into a vein. It is given by your care team in a hospital or clinic setting. Talk to your care team about the use of this medication in children. Special care may be needed. Overdosage: If you think you have taken too much of this medicine contact a poison control center or emergency room at once. NOTE: This medicine is only for you. Do not share this medicine with others. What if I miss a dose? Keep appointments for follow-up doses. It is important not to miss your dose. Call your care team if you are unable to keep an appointment. What may interact with this medication? Amphotericin B Amiodarone Azathioprine Certain antivirals for HIV or hepatitis Certain medications for blood pressure, such as enalapril, lisinopril , quinapril Cyclosporine Diuretics Etanercept Indomethacin Medications that relax muscles Metronidazole Natalizumab Tamoxifen Warfarin This list may not describe all possible interactions. Give your health care provider a list of all the medicines, herbs, non-prescription drugs, or dietary supplements you use. Also tell them if you smoke, drink alcohol, or use illegal drugs. Some items may interact with your medicine. What should I watch for while using this medication? This medication may make you feel generally unwell. This is not uncommon as chemotherapy can affect healthy cells as well as cancer cells. Report any side effects. Continue your course of treatment even though you feel ill unless your care team tells you to stop. You may need blood work while you are taking this medication. This medication may increase your risk of getting an infection. Call your care team for advice if you get a fever, chills, sore throat, or other symptoms of a cold or flu. Do not treat yourself. Try to avoid being around people who are sick. Avoid taking medications that contain aspirin, acetaminophen , ibuprofen , naproxen, or ketoprofen unless instructed by your care team. These  medications may hide a fever. Be careful brushing or flossing your teeth or using a toothpick because you may get an infection or bleed more easily. If you have any dental work done, tell your dentist you are receiving this medication. Drink water or other fluids as directed. Urinate often, even at night. Some products may contain alcohol. Ask your care team if this medication contains alcohol. Be sure to tell all care teams you are taking this medicine. Certain medicines, like metronidazole and disulfiram, can cause an unpleasant reaction when taken with alcohol. The reaction includes flushing, headache, nausea, vomiting, sweating, and increased thirst. The reaction can last from 30 minutes to several hours. Talk to your care team if you wish to become pregnant or think you might be pregnant. This medication can cause serious birth defects if taken during pregnancy and for 1 year after the last dose. A negative pregnancy test is required before starting this medication. A reliable form of contraception is recommended while taking this medication and for 1 year after the last dose. Talk to your care team about reliable forms of contraception. Do not father a child while taking this medication and for 4 months after the last dose. Use a condom during this time period. Do not breast-feed while taking this medication or for 1 week after the last dose. This medication may cause infertility. Talk to your care  team if you are concerned about your fertility. Talk to your care team about your risk of cancer. You may be more at risk for certain types of cancer if you take this medication. What side effects may I notice from receiving this medication? Side effects that you should report to your care team as soon as possible: Allergic reactions--skin rash, itching, hives, swelling of the face, lips, tongue, or throat Dry cough, shortness of breath or trouble breathing Heart failure--shortness of breath, swelling of  the ankles, feet, or hands, sudden weight gain, unusual weakness or fatigue Heart muscle inflammation--unusual weakness or fatigue, shortness of breath, chest pain, fast or irregular heartbeat, dizziness, swelling of the ankles, feet, or hands Heart rhythm changes--fast or irregular heartbeat, dizziness, feeling faint or lightheaded, chest pain, trouble breathing Infection--fever, chills, cough, sore throat, wounds that don't heal, pain or trouble when passing urine, general feeling of discomfort or being unwell Kidney injury--decrease in the amount of urine, swelling of the ankles, hands, or feet Liver injury--right upper belly pain, loss of appetite, nausea, light-colored stool, dark yellow or brown urine, yellowing skin or eyes, unusual weakness or fatigue Low red blood cell level--unusual weakness or fatigue, dizziness, headache, trouble breathing Low sodium level--muscle weakness, fatigue, dizziness, headache, confusion Red or dark brown urine Unusual bruising or bleeding Side effects that usually do not require medical attention (report to your care team if they continue or are bothersome): Hair loss Irregular menstrual cycles or spotting Loss of appetite Nausea Pain, redness, or swelling with sores inside the mouth or throat Vomiting This list may not describe all possible side effects. Call your doctor for medical advice about side effects. You may report side effects to FDA at 1-800-FDA-1088. Where should I keep my medication? This medication is given in a hospital or clinic. It will not be stored at home. NOTE: This sheet is a summary. It may not cover all possible information. If you have questions about this medicine, talk to your doctor, pharmacist, or health care provider.  2024 Elsevier/Gold Standard (2021-11-17 00:00:00)

## 2024-08-14 ENCOUNTER — Inpatient Hospital Stay

## 2024-08-18 ENCOUNTER — Other Ambulatory Visit (HOSPITAL_BASED_OUTPATIENT_CLINIC_OR_DEPARTMENT_OTHER): Payer: Self-pay

## 2024-08-20 ENCOUNTER — Inpatient Hospital Stay

## 2024-08-20 ENCOUNTER — Inpatient Hospital Stay: Admitting: Nurse Practitioner

## 2024-08-20 ENCOUNTER — Other Ambulatory Visit (HOSPITAL_BASED_OUTPATIENT_CLINIC_OR_DEPARTMENT_OTHER): Payer: Self-pay

## 2024-08-20 ENCOUNTER — Encounter: Payer: Self-pay | Admitting: Nurse Practitioner

## 2024-08-20 VITALS — BP 144/91 | HR 111 | Temp 97.7°F | Resp 16 | Ht 66.0 in | Wt 310.8 lb

## 2024-08-20 DIAGNOSIS — K5903 Drug induced constipation: Secondary | ICD-10-CM

## 2024-08-20 DIAGNOSIS — R530 Neoplastic (malignant) related fatigue: Secondary | ICD-10-CM

## 2024-08-20 DIAGNOSIS — C9000 Multiple myeloma not having achieved remission: Secondary | ICD-10-CM

## 2024-08-20 DIAGNOSIS — M792 Neuralgia and neuritis, unspecified: Secondary | ICD-10-CM

## 2024-08-20 DIAGNOSIS — Z515 Encounter for palliative care: Secondary | ICD-10-CM

## 2024-08-20 DIAGNOSIS — G893 Neoplasm related pain (acute) (chronic): Secondary | ICD-10-CM

## 2024-08-20 LAB — CMP (CANCER CENTER ONLY)
ALT: 9 U/L (ref 0–44)
AST: 17 U/L (ref 15–41)
Albumin: 3.9 g/dL (ref 3.5–5.0)
Alkaline Phosphatase: 78 U/L (ref 38–126)
Anion gap: 13 (ref 5–15)
BUN: 27 mg/dL — ABNORMAL HIGH (ref 6–20)
CO2: 22 mmol/L (ref 22–32)
Calcium: 9.1 mg/dL (ref 8.9–10.3)
Chloride: 103 mmol/L (ref 98–111)
Creatinine: 1.76 mg/dL — ABNORMAL HIGH (ref 0.44–1.00)
GFR, Estimated: 36 mL/min — ABNORMAL LOW
Glucose, Bld: 114 mg/dL — ABNORMAL HIGH (ref 70–99)
Potassium: 3.6 mmol/L (ref 3.5–5.1)
Sodium: 138 mmol/L (ref 135–145)
Total Bilirubin: 0.3 mg/dL (ref 0.0–1.2)
Total Protein: 6.4 g/dL — ABNORMAL LOW (ref 6.5–8.1)

## 2024-08-20 LAB — CBC WITH DIFFERENTIAL (CANCER CENTER ONLY)
Abs Immature Granulocytes: 0.03 10*3/uL (ref 0.00–0.07)
Basophils Absolute: 0 10*3/uL (ref 0.0–0.1)
Basophils Relative: 0 %
Eosinophils Absolute: 0.2 10*3/uL (ref 0.0–0.5)
Eosinophils Relative: 2 %
HCT: 29.5 % — ABNORMAL LOW (ref 36.0–46.0)
Hemoglobin: 9.6 g/dL — ABNORMAL LOW (ref 12.0–15.0)
Immature Granulocytes: 0 %
Lymphocytes Relative: 9 %
Lymphs Abs: 0.7 10*3/uL (ref 0.7–4.0)
MCH: 29.9 pg (ref 26.0–34.0)
MCHC: 32.5 g/dL (ref 30.0–36.0)
MCV: 91.9 fL (ref 80.0–100.0)
Monocytes Absolute: 0.5 10*3/uL (ref 0.1–1.0)
Monocytes Relative: 6 %
Neutro Abs: 7 10*3/uL (ref 1.7–7.7)
Neutrophils Relative %: 83 %
Platelet Count: 305 10*3/uL (ref 150–400)
RBC: 3.21 MIL/uL — ABNORMAL LOW (ref 3.87–5.11)
RDW: 13.9 % (ref 11.5–15.5)
WBC Count: 8.4 10*3/uL (ref 4.0–10.5)
nRBC: 0 % (ref 0.0–0.2)

## 2024-08-20 MED ORDER — PALONOSETRON HCL INJECTION 0.25 MG/5ML
0.2500 mg | Freq: Once | INTRAVENOUS | Status: AC
  Administered 2024-08-20: 0.25 mg via INTRAVENOUS
  Filled 2024-08-20: qty 5

## 2024-08-20 MED ORDER — MONTELUKAST SODIUM 10 MG PO TABS
10.0000 mg | ORAL_TABLET | Freq: Once | ORAL | Status: AC
  Administered 2024-08-20: 10 mg via ORAL
  Filled 2024-08-20: qty 1

## 2024-08-20 MED ORDER — ACETAMINOPHEN 325 MG PO TABS
650.0000 mg | ORAL_TABLET | Freq: Once | ORAL | Status: AC
  Administered 2024-08-20: 650 mg via ORAL
  Filled 2024-08-20: qty 2

## 2024-08-20 MED ORDER — DIPHENHYDRAMINE HCL 25 MG PO CAPS
50.0000 mg | ORAL_CAPSULE | Freq: Once | ORAL | Status: AC
  Administered 2024-08-20: 50 mg via ORAL
  Filled 2024-08-20: qty 2

## 2024-08-20 MED ORDER — SODIUM CHLORIDE 0.9 % IV SOLN
300.0000 mg/m2 | Freq: Once | INTRAVENOUS | Status: AC
  Administered 2024-08-20: 780 mg via INTRAVENOUS
  Filled 2024-08-20: qty 39

## 2024-08-20 MED ORDER — DARATUMUMAB-HYALURONIDASE-FIHJ 1800-30000 MG-UT/15ML ~~LOC~~ SOLN
1800.0000 mg | Freq: Once | SUBCUTANEOUS | Status: AC
  Administered 2024-08-20: 1800 mg via SUBCUTANEOUS
  Filled 2024-08-20: qty 15

## 2024-08-20 MED ORDER — SODIUM CHLORIDE 0.9 % IV SOLN: Freq: Once | INTRAVENOUS | Status: AC

## 2024-08-20 MED ORDER — BORTEZOMIB CHEMO SQ INJECTION 3.5 MG (2.5MG/ML)
1.5000 mg/m2 | Freq: Once | INTRAMUSCULAR | Status: AC
  Administered 2024-08-20: 4 mg via SUBCUTANEOUS
  Filled 2024-08-20: qty 1.6

## 2024-08-20 NOTE — Progress Notes (Signed)
 "    Palliative Medicine [facility]  [phone] [phone]) [phone]   Name: Kathryn [name] Date: 08/20/2024 MRN: [id number]  DOB: 03-11-1981  Patient Care Team: Kathryn Brunet, DO as PCP - General (Family Medicine) Kathryn [name] SAILOR, NP as Nurse Practitioner (Hospice and Palliative Medicine)    INTERVAL HISTORY: Kathryn [name] is a 44 y.o. female with oncologic medical history of recently diagnosed multiple myeloma (07/11/24) after presenting to ED with back pain found to have lytic lesions.  Palliative is ask to follow patient for symptom management.   SOCIAL HISTORY:     reports that she quit smoking about 23 years ago. Her smoking use included cigarettes. She started smoking about 24 years ago. She has a 0.3 pack-year smoking history. She has never used smokeless tobacco. She reports current alcohol use. She reports that she does not use drugs.  ADVANCE DIRECTIVES:  None on file. Patient reports her friend Kathryn [name] would be her medical decision maker.   CODE STATUS: Full code  PAST MEDICAL HISTORY: Past Medical History:  Diagnosis Date   Anemia    history only - no current problem   CHF (congestive heart failure) (HCC)    ECHO cardiogram schedule 07/11/11   Diabetes mellitus    no med - last A1C was 5.9  on 04/2011   Hyperlipidemia    Hypertension    Sleep apnea    does not use CPAP   UTI (lower urinary tract infection)    Vitamin D  deficiency     ALLERGIES:  is allergic to pravastatin, floxin [ofloxacin], lisinopril , and sulfa antibiotics.  MEDICATIONS:  Current Outpatient Medications  Medication Sig Dispense Refill   acetaminophen  (TYLENOL ) 325 MG tablet Take 2 tablets (650 mg total) by mouth every 6 (six) hours as needed.     allopurinol  (ZYLOPRIM ) 300 MG tablet Take 1 tablet (300 mg total) by mouth daily. 90 tablet 0   carvedilol  (COREG ) 12.5 MG tablet Take 12.5 mg by mouth 2 (two) times daily  with a meal.     dexamethasone  (DECADRON ) 4 MG tablet Take 10 tablets (40 mg total) by mouth once a week. 40 tablet 5   esomeprazole (NEXIUM) 20 MG capsule Take 20 mg by mouth daily before breakfast.     famotidine  (PEPCID ) 20 MG tablet Take 20 mg by mouth daily after breakfast.     feeding supplement (ENSURE PLUS HIGH PROTEIN) LIQD Take 237 mLs by mouth 2 (two) times daily between meals.     [Paused] furosemide  (LASIX ) 40 MG tablet Take 40 mg by mouth daily after breakfast. (Patient not taking: Reported on 08/13/2024)     gabapentin  (NEURONTIN ) 300 MG capsule Take 1 capsule (300 mg total) by mouth 3 (three) times daily. 90 capsule 0   lisdexamfetamine  (VYVANSE ) 40 MG capsule Take 40 mg by mouth every morning.     losartan -hydrochlorothiazide  (HYZAAR) 100-12.5 MG tablet Take 1 tablet by mouth daily.     methocarbamol  (ROBAXIN ) 500 MG tablet Take 1 tablet (500 mg total) by mouth every 8 (eight) hours as needed for up to 90 doses for muscle spasms. 90 tablet 0   ondansetron  (ZOFRAN ) 8 MG tablet Take 1 tablet (8 mg total) by mouth every 8 (eight) hours as needed. 30 tablet 0   oxyCODONE  (OXY IR/ROXICODONE ) 5 MG immediate release tablet Take 1-2 tablets (5-10 mg total) by mouth every 4 (four) hours as needed for severe pain (pain score 7-10). 90 tablet 0  oxyCODONE  (OXYCONTIN ) 10 mg 12 hr tablet Take 1 tablet (10 mg total) by mouth every 12 (twelve) hours. 14 tablet 0   potassium chloride  SA (KLOR-CON  M) 20 MEQ tablet Take 1 tablet (20 mEq total) by mouth 2 (two) times daily. 60 tablet 0   prochlorperazine  (COMPAZINE ) 10 MG tablet Take 1 tablet (10 mg total) by mouth every 6 (six) hours as needed for nausea or vomiting. 30 tablet 0   senna-docusate (SENNA S) 8.6-50 MG tablet Take 2 tablets by mouth at bedtime. 100 tablet 3   tirzepatide (MOUNJARO) 7.5 MG/0.5ML Pen Inject 7.5 mg into the skin once a week.     valACYclovir (VALTREX) 500 MG tablet Take 500 mg by mouth daily.     No current  facility-administered medications for this visit.    VITAL SIGNS: There were no vitals taken for this visit. There were no vitals filed for this visit.  Estimated body mass index is 50.16 kg/m as calculated from the following:   Height as of an earlier encounter on 08/20/24: 5' 6 (1.676 m).   Weight as of an earlier encounter on 08/20/24: 310 lb 12 oz (141 kg).   PERFORMANCE STATUS (ECOG) : 1 - Symptomatic but completely ambulatory  Physical Exam General: NAD Cardiovascular: regular rate and rhythm Pulmonary: normal breathing pattern Extremities: no edema, no joint deformities Skin: no rashes Neurological: AAO x3  IMPRESSION: Discussed the use of AI scribe software for clinical note transcription with the patient, who gave verbal consent to proceed.  History of Present Illness Kathryn [name] is a 44 year old female with multiple myeloma who was seen during infusion for symptom management follow-up. No family present.   She experiences constipation, which has been managed with Miralax. Initially taking Miralax twice a day, she found it too effective, leading to discomfort and loose stools. She has since cut back to once daily.   We discussed Kathryn [name] pain at length. She has been managing her pain with use of oxycodone . Due to winter weather she has not been able to pick up her OxyContin  10 mg.  Patient plans to pick up today.  Continues on gabapentin  300 mg 3 times daily.  No adjustments to current regimen at this time.  Will see how patient tolerates OxyContin  with hopes that her pain is better managed in the upcoming weeks.  Kathryn [name] describes a busy week, exacerbated by snow and travel disruptions. 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 Despite these challenges, her pain is well managed and she is doing well overall.  All questions answered and support provided. Assessment & Plan Cancer related pain  management Cancer related pain is somewhat managed with current medication regimen.  She has not been able to pick up her OxyContin  10 mg due to 1 to whether. - Continue current pain management regimen. -Continue oxycodone  5-10 mg every 4 hours as needed for breakthrough pain. Patient is to start OxyContin  10 mg every 12 hours later this evening. -Gabapentin  300 mg 3 times daily - Ensure medications are obtained from the pharmacy offering the best price.  Constipation Managed with Miralax.  Decrease to once daily due to loose stools.  I will plan to see patient back in 2 weeks.  Sooner if needed.  Patient expressed understanding and was in agreement with this plan. She also understands that She can call the clinic at any time with any questions, concerns, or complaints.  Any controlled substances utilized were prescribed in the context of palliative care. PDMP has been reviewed.   I personally spent a total of 30 minutes in the care of the patient today including preparing to see the patient, getting/reviewing separately obtained history, performing a medically appropriate exam/evaluation, counseling and educating, and documenting clinical information in the EHR.  Visit consisted of counseling and education dealing with the complex and emotionally intense issues of symptom management and palliative care in the setting of serious and potentially life-threatening illness.  Levon Borer, AGPCNP-BC  Palliative Medicine Team/[facility]    "

## 2024-08-20 NOTE — Patient Instructions (Signed)
 [facility] - A DEPT OF [place] [place] [facility]  Discharge Instructions: Thank you for choosing [facility] to provide your oncology and hematology care.   If you have a lab appointment with the Cancer Center, please go directly to the Cancer Center and check in at the registration area.   Wear comfortable clothing and clothing appropriate for easy access to any Portacath or PICC line.   We strive to give you quality time with your provider. You may need to reschedule your appointment if you arrive late (15 or more minutes).  Arriving late affects you and other patients whose appointments are after yours.  Also, if you miss three or more appointments without notifying the office, you may be dismissed from the clinic at the providers discretion.      For prescription refill requests, have your pharmacy contact our office and allow 72 hours for refills to be completed.    Today you received the following chemotherapy and/or immunotherapy agents cytoxan  velcade  darzalex  faspro      To help prevent nausea and vomiting after your treatment, we encourage you to take your nausea medication as directed.  BELOW ARE SYMPTOMS THAT SHOULD BE REPORTED IMMEDIATELY: *FEVER GREATER THAN 100.4 F (38 C) OR HIGHER *CHILLS OR SWEATING *NAUSEA AND VOMITING THAT IS NOT CONTROLLED WITH YOUR NAUSEA MEDICATION *UNUSUAL SHORTNESS OF BREATH *UNUSUAL BRUISING OR BLEEDING *URINARY PROBLEMS (pain or burning when urinating, or frequent urination) *BOWEL PROBLEMS (unusual diarrhea, constipation, pain near the anus) TENDERNESS IN MOUTH AND THROAT WITH OR WITHOUT PRESENCE OF ULCERS (sore throat, sores in mouth, or a toothache) UNUSUAL RASH, SWELLING OR PAIN  UNUSUAL VAGINAL DISCHARGE OR ITCHING   Items with * indicate a potential emergency and should be followed up as soon as possible or go to the Emergency Department if any problems should occur.  Please show the CHEMOTHERAPY ALERT  CARD or IMMUNOTHERAPY ALERT CARD at check-in to the Emergency Department and triage nurse.  Should you have questions after your visit or need to cancel or reschedule your appointment, please contact [facility]  Dept: [phone]  and follow the prompts.  Office hours are 8:00 a.m. to 4:30 p.m. Monday - Friday. Please note that voicemails left after 4:00 p.m. may not be returned until the following business day.  We are closed weekends and major holidays. You have access to a nurse at all times for urgent questions. Please call the main number to the clinic Dept: [phone] and follow the prompts.   For any non-urgent questions, you may also contact your provider using MyChart. We now offer e-Visits for anyone 44 and older to request care online for non-urgent symptoms. For details visit [email or web address].   Also download the MyChart app! Go to the app store, search MyChart, open the app, select [place], and log in with your MyChart username and password.

## 2024-08-21 ENCOUNTER — Inpatient Hospital Stay

## 2024-08-27 ENCOUNTER — Inpatient Hospital Stay

## 2024-08-27 ENCOUNTER — Inpatient Hospital Stay: Admitting: Hematology and Oncology

## 2024-08-28 ENCOUNTER — Inpatient Hospital Stay

## 2024-08-28 ENCOUNTER — Inpatient Hospital Stay: Admitting: Hematology and Oncology

## 2024-09-03 ENCOUNTER — Inpatient Hospital Stay

## 2024-09-04 ENCOUNTER — Inpatient Hospital Stay

## 2024-09-10 ENCOUNTER — Inpatient Hospital Stay: Admitting: Hematology and Oncology

## 2024-09-10 ENCOUNTER — Inpatient Hospital Stay

## 2024-09-11 ENCOUNTER — Inpatient Hospital Stay: Admitting: Hematology and Oncology

## 2024-09-11 ENCOUNTER — Inpatient Hospital Stay

## 2024-09-17 ENCOUNTER — Inpatient Hospital Stay

## 2024-09-17 ENCOUNTER — Inpatient Hospital Stay: Admitting: Hematology and Oncology

## 2024-09-18 ENCOUNTER — Inpatient Hospital Stay

## 2024-09-18 ENCOUNTER — Inpatient Hospital Stay: Admitting: Hematology and Oncology

## 2024-09-24 ENCOUNTER — Inpatient Hospital Stay
